# Patient Record
Sex: Female | Born: 1940
Health system: Southern US, Community
[De-identification: ages and names within clinical notes are randomized; demographics above are authoritative.]

## PROBLEM LIST (undated history)

## (undated) DIAGNOSIS — E119 Type 2 diabetes mellitus without complications: Secondary | ICD-10-CM

## (undated) DIAGNOSIS — E785 Hyperlipidemia, unspecified: Secondary | ICD-10-CM

## (undated) DIAGNOSIS — H3530 Unspecified macular degeneration: Secondary | ICD-10-CM

## (undated) DIAGNOSIS — N200 Calculus of kidney: Secondary | ICD-10-CM

## (undated) DIAGNOSIS — J45909 Unspecified asthma, uncomplicated: Secondary | ICD-10-CM

## (undated) DIAGNOSIS — J309 Allergic rhinitis, unspecified: Secondary | ICD-10-CM

## (undated) DIAGNOSIS — M1990 Unspecified osteoarthritis, unspecified site: Secondary | ICD-10-CM

## (undated) DIAGNOSIS — D649 Anemia, unspecified: Secondary | ICD-10-CM

## (undated) DIAGNOSIS — K5790 Diverticulosis of intestine, part unspecified, without perforation or abscess without bleeding: Secondary | ICD-10-CM

## (undated) HISTORY — DX: Unspecified asthma, uncomplicated: J45.909

## (undated) HISTORY — DX: Calculus of kidney: N20.0

## (undated) HISTORY — DX: Anemia, unspecified: D64.9

## (undated) HISTORY — DX: Type 2 diabetes mellitus without complications: E11.9

## (undated) HISTORY — DX: Allergic rhinitis, unspecified: J30.9

## (undated) HISTORY — PX: DILATION AND CURETTAGE OF UTERUS: SHX78

## (undated) HISTORY — DX: Hyperlipidemia, unspecified: E78.5

## (undated) HISTORY — DX: Diverticulosis of intestine, part unspecified, without perforation or abscess without bleeding: K57.90

## (undated) HISTORY — DX: Unspecified osteoarthritis, unspecified site: M19.90

## (undated) HISTORY — DX: Unspecified macular degeneration: H35.30

## (undated) HISTORY — PX: TONSILLECTOMY AND ADENOIDECTOMY: SUR1326

---

## 1997-11-14 ENCOUNTER — Other Ambulatory Visit: Admission: RE | Admit: 1997-11-14 | Discharge: 1997-11-14 | Payer: Self-pay | Admitting: *Deleted

## 1998-06-07 ENCOUNTER — Other Ambulatory Visit: Admission: RE | Admit: 1998-06-07 | Discharge: 1998-06-07 | Payer: Self-pay | Admitting: *Deleted

## 1999-09-19 ENCOUNTER — Encounter: Payer: Self-pay | Admitting: Internal Medicine

## 1999-09-19 ENCOUNTER — Encounter: Admission: RE | Admit: 1999-09-19 | Discharge: 1999-09-19 | Payer: Self-pay | Admitting: Internal Medicine

## 1999-10-30 ENCOUNTER — Ambulatory Visit (HOSPITAL_COMMUNITY): Admission: RE | Admit: 1999-10-30 | Discharge: 1999-10-30 | Payer: Self-pay | Admitting: Gastroenterology

## 1999-10-30 ENCOUNTER — Encounter: Payer: Self-pay | Admitting: Gastroenterology

## 2000-04-22 ENCOUNTER — Other Ambulatory Visit: Admission: RE | Admit: 2000-04-22 | Discharge: 2000-04-22 | Payer: Self-pay | Admitting: *Deleted

## 2003-06-28 ENCOUNTER — Other Ambulatory Visit: Admission: RE | Admit: 2003-06-28 | Discharge: 2003-06-28 | Payer: Self-pay | Admitting: Internal Medicine

## 2004-07-11 ENCOUNTER — Other Ambulatory Visit: Admission: RE | Admit: 2004-07-11 | Discharge: 2004-07-11 | Payer: Self-pay | Admitting: Family Medicine

## 2005-08-19 ENCOUNTER — Other Ambulatory Visit: Admission: RE | Admit: 2005-08-19 | Discharge: 2005-08-19 | Payer: Self-pay | Admitting: Internal Medicine

## 2007-09-08 ENCOUNTER — Other Ambulatory Visit: Admission: RE | Admit: 2007-09-08 | Discharge: 2007-09-08 | Payer: Self-pay | Admitting: Internal Medicine

## 2008-12-27 ENCOUNTER — Encounter: Admission: RE | Admit: 2008-12-27 | Discharge: 2008-12-27 | Payer: Self-pay | Admitting: Internal Medicine

## 2008-12-30 ENCOUNTER — Encounter: Admission: RE | Admit: 2008-12-30 | Discharge: 2008-12-30 | Payer: Self-pay | Admitting: Internal Medicine

## 2008-12-30 ENCOUNTER — Encounter (INDEPENDENT_AMBULATORY_CARE_PROVIDER_SITE_OTHER): Payer: Self-pay | Admitting: Diagnostic Radiology

## 2009-01-24 ENCOUNTER — Inpatient Hospital Stay (HOSPITAL_COMMUNITY): Admission: EM | Admit: 2009-01-24 | Discharge: 2009-01-24 | Payer: Self-pay | Admitting: Emergency Medicine

## 2009-05-27 ENCOUNTER — Emergency Department (HOSPITAL_COMMUNITY): Admission: EM | Admit: 2009-05-27 | Discharge: 2009-05-27 | Payer: Self-pay | Admitting: Emergency Medicine

## 2009-06-24 LAB — HM PAP SMEAR

## 2009-06-28 ENCOUNTER — Other Ambulatory Visit: Admission: RE | Admit: 2009-06-28 | Discharge: 2009-06-28 | Payer: Self-pay | Admitting: Obstetrics and Gynecology

## 2009-08-09 ENCOUNTER — Encounter: Admission: RE | Admit: 2009-08-09 | Discharge: 2009-08-09 | Payer: Self-pay | Admitting: Internal Medicine

## 2009-09-14 ENCOUNTER — Encounter: Admission: RE | Admit: 2009-09-14 | Discharge: 2009-09-14 | Payer: Self-pay | Admitting: Internal Medicine

## 2010-01-09 ENCOUNTER — Encounter: Admission: RE | Admit: 2010-01-09 | Discharge: 2010-01-09 | Payer: Self-pay | Admitting: Internal Medicine

## 2010-07-03 ENCOUNTER — Encounter: Payer: Self-pay | Admitting: Internal Medicine

## 2010-07-26 NOTE — Letter (Signed)
Summary: Colonoscopy Letter  [facility]  [address] [place], [place] [address]   Phone: [phone]  Fax: [phone]      July 03, 2010 MRN: [id number]   [facility] [address] [place], [place]  [address]   Dear Ms. Muff,   According to your medical record, it is time for you to schedule a Colonoscopy. The American Cancer Society recommends this procedure as a method to detect early colon cancer. Patients with a family history of colon cancer, or a personal history of colon polyps or inflammatory bowel disease are at increased risk.  This letter has been generated based on the recommendations made at the time of your procedure. If you feel that in your particular situation this may no longer apply, please contact our office.  Please call our office at [phone] to schedule this appointment or to update your records at your earliest convenience.  Thank you for cooperating with [place] to provide you with the very best care possible.   Sincerely,  Hedwig Morton. Juanda Chance, M.D.  [facility] [phone]

## 2010-09-21 LAB — HEMOGLOBIN A1C: Hgb A1c MFr Bld: 5.8 % (ref 4.0–6.0)

## 2010-09-25 LAB — CBC
HCT: 41.9 % (ref 36.0–46.0)
Hemoglobin: 14 g/dL (ref 12.0–15.0)
MCHC: 33.5 g/dL (ref 30.0–36.0)
MCV: 91.5 fL (ref 78.0–100.0)
Platelets: 308 10*3/uL (ref 150–400)
RBC: 4.57 MIL/uL (ref 3.87–5.11)
RDW: 13.4 % (ref 11.5–15.5)
WBC: 9 10*3/uL (ref 4.0–10.5)

## 2010-09-25 LAB — URINE CULTURE: Colony Count: 15000

## 2010-09-25 LAB — POCT I-STAT, CHEM 8
BUN: 14 mg/dL (ref 6–23)
Calcium, Ion: 1.1 mmol/L — ABNORMAL LOW (ref 1.12–1.32)
Chloride: 108 mEq/L (ref 96–112)
Creatinine, Ser: 0.7 mg/dL (ref 0.4–1.2)
Glucose, Bld: 119 mg/dL — ABNORMAL HIGH (ref 70–99)
HCT: 44 % (ref 36.0–46.0)
Hemoglobin: 15 g/dL (ref 12.0–15.0)
Potassium: 4 mEq/L (ref 3.5–5.1)
Sodium: 139 mEq/L (ref 135–145)
TCO2: 20 mmol/L (ref 0–100)

## 2010-09-25 LAB — DIFFERENTIAL
Basophils Absolute: 0 10*3/uL (ref 0.0–0.1)
Basophils Relative: 1 % (ref 0–1)
Eosinophils Absolute: 0.3 10*3/uL (ref 0.0–0.7)
Eosinophils Relative: 3 % (ref 0–5)
Lymphocytes Relative: 29 % (ref 12–46)
Lymphs Abs: 2.6 10*3/uL (ref 0.7–4.0)
Monocytes Absolute: 0.5 10*3/uL (ref 0.1–1.0)
Monocytes Relative: 5 % (ref 3–12)
Neutro Abs: 5.6 10*3/uL (ref 1.7–7.7)
Neutrophils Relative %: 62 % (ref 43–77)

## 2010-09-25 LAB — URINALYSIS, ROUTINE W REFLEX MICROSCOPIC
Bilirubin Urine: NEGATIVE
Glucose, UA: NEGATIVE mg/dL
Hgb urine dipstick: NEGATIVE
Ketones, ur: NEGATIVE mg/dL
Nitrite: NEGATIVE
Protein, ur: NEGATIVE mg/dL
Specific Gravity, Urine: 1.013 (ref 1.005–1.030)
Urobilinogen, UA: 0.2 mg/dL (ref 0.0–1.0)
pH: 5.5 (ref 5.0–8.0)

## 2010-09-25 LAB — POCT CARDIAC MARKERS
CKMB, poc: <1 ng/mL — ABNORMAL LOW (ref 1.0–8.0)
Myoglobin, poc: 79.6 ng/mL (ref 12–200)
Troponin i, poc: <0.05 ng/mL (ref 0.00–0.09)

## 2010-09-25 LAB — URINE MICROSCOPIC-ADD ON

## 2010-09-30 LAB — CK TOTAL AND CKMB (NOT AT ARMC)
CK, MB: 1.4 ng/mL (ref 0.3–4.0)
CK, MB: 1.6 ng/mL (ref 0.3–4.0)
Relative Index: INVALID (ref 0.0–2.5)
Relative Index: INVALID (ref 0.0–2.5)
Total CK: 91 U/L (ref 7–177)
Total CK: 92 U/L (ref 7–177)

## 2010-09-30 LAB — URINALYSIS, ROUTINE W REFLEX MICROSCOPIC
Bilirubin Urine: NEGATIVE
Glucose, UA: NEGATIVE mg/dL
Hgb urine dipstick: NEGATIVE
Ketones, ur: NEGATIVE mg/dL
Nitrite: NEGATIVE
Protein, ur: NEGATIVE mg/dL
Specific Gravity, Urine: 1.01 (ref 1.005–1.030)
Urobilinogen, UA: 0.2 mg/dL (ref 0.0–1.0)
pH: 6 (ref 5.0–8.0)

## 2010-09-30 LAB — CBC
HCT: 38.7 % (ref 36.0–46.0)
HCT: 42.3 % (ref 36.0–46.0)
Hemoglobin: 13.2 g/dL (ref 12.0–15.0)
Hemoglobin: 14.6 g/dL (ref 12.0–15.0)
MCHC: 34.1 g/dL (ref 30.0–36.0)
MCHC: 34.5 g/dL (ref 30.0–36.0)
MCV: 91.2 fL (ref 78.0–100.0)
MCV: 91.7 fL (ref 78.0–100.0)
Platelets: 285 10*3/uL (ref 150–400)
Platelets: 322 10*3/uL (ref 150–400)
RBC: 4.24 MIL/uL (ref 3.87–5.11)
RBC: 4.61 MIL/uL (ref 3.87–5.11)
RDW: 12.8 % (ref 11.5–15.5)
RDW: 13.1 % (ref 11.5–15.5)
WBC: 7.2 10*3/uL (ref 4.0–10.5)
WBC: 9.1 10*3/uL (ref 4.0–10.5)

## 2010-09-30 LAB — PROTIME-INR
INR: 1 (ref 0.00–1.49)
Prothrombin Time: 13.1 seconds (ref 11.6–15.2)

## 2010-09-30 LAB — COMPREHENSIVE METABOLIC PANEL
ALT: 21 U/L (ref 0–35)
AST: 19 U/L (ref 0–37)
Albumin: 3.3 g/dL — ABNORMAL LOW (ref 3.5–5.2)
Alkaline Phosphatase: 47 U/L (ref 39–117)
BUN: 15 mg/dL (ref 6–23)
CO2: 25 mEq/L (ref 19–32)
Calcium: 8.6 mg/dL (ref 8.4–10.5)
Chloride: 108 mEq/L (ref 96–112)
Creatinine, Ser: 0.8 mg/dL (ref 0.4–1.2)
GFR calc Af Amer: >60 mL/min (ref >60)
GFR calc non Af Amer: >60 mL/min (ref >60)
Glucose, Bld: 105 mg/dL — ABNORMAL HIGH (ref 70–99)
Potassium: 3.9 mEq/L (ref 3.5–5.1)
Sodium: 139 mEq/L (ref 135–145)
Total Bilirubin: 0.6 mg/dL (ref 0.3–1.2)
Total Protein: 6.1 g/dL (ref 6.0–8.3)

## 2010-09-30 LAB — LIPID PANEL
Cholesterol: 167 mg/dL (ref 0–200)
HDL: 32 mg/dL — ABNORMAL LOW (ref >39)
LDL Cholesterol: 119 mg/dL — ABNORMAL HIGH (ref 0–99)
Total CHOL/HDL Ratio: 5.2 RATIO
Triglycerides: 81 mg/dL (ref <150)
VLDL: 16 mg/dL (ref 0–40)

## 2010-09-30 LAB — TSH: TSH: 3.278 u[IU]/mL (ref 0.350–4.500)

## 2010-09-30 LAB — POCT I-STAT, CHEM 8
BUN: 22 mg/dL (ref 6–23)
Calcium, Ion: 1.19 mmol/L (ref 1.12–1.32)
Chloride: 106 mEq/L (ref 96–112)
Creatinine, Ser: 0.9 mg/dL (ref 0.4–1.2)
Glucose, Bld: 98 mg/dL (ref 70–99)
HCT: 45 % (ref 36.0–46.0)
Hemoglobin: 15.3 g/dL — ABNORMAL HIGH (ref 12.0–15.0)
Potassium: 4.1 mEq/L (ref 3.5–5.1)
Sodium: 140 mEq/L (ref 135–145)
TCO2: 26 mmol/L (ref 0–100)

## 2010-09-30 LAB — DIFFERENTIAL
Basophils Absolute: 0.1 10*3/uL (ref 0.0–0.1)
Basophils Relative: 1 % (ref 0–1)
Eosinophils Absolute: 0.3 10*3/uL (ref 0.0–0.7)
Eosinophils Relative: 3 % (ref 0–5)
Lymphocytes Relative: 36 % (ref 12–46)
Lymphs Abs: 3.2 10*3/uL (ref 0.7–4.0)
Monocytes Absolute: 0.6 10*3/uL (ref 0.1–1.0)
Monocytes Relative: 7 % (ref 3–12)
Neutro Abs: 4.9 10*3/uL (ref 1.7–7.7)
Neutrophils Relative %: 54 % (ref 43–77)

## 2010-09-30 LAB — POCT CARDIAC MARKERS
CKMB, poc: <1 ng/mL — ABNORMAL LOW (ref 1.0–8.0)
CKMB, poc: <1 ng/mL — ABNORMAL LOW (ref 1.0–8.0)
Myoglobin, poc: 56.1 ng/mL (ref 12–200)
Myoglobin, poc: 57.4 ng/mL (ref 12–200)
Troponin i, poc: <0.05 ng/mL (ref 0.00–0.09)
Troponin i, poc: <0.05 ng/mL (ref 0.00–0.09)

## 2010-09-30 LAB — D-DIMER, QUANTITATIVE: D-Dimer, Quant: <0.22 ug/mL-FEU (ref 0.00–0.48)

## 2010-09-30 LAB — TROPONIN I
Troponin I: 0.01 ng/mL (ref 0.00–0.06)
Troponin I: 0.01 ng/mL (ref 0.00–0.06)

## 2010-09-30 LAB — LIPASE, BLOOD: Lipase: 20 U/L (ref 11–59)

## 2010-09-30 LAB — URINE MICROSCOPIC-ADD ON

## 2010-09-30 LAB — APTT: aPTT: 26 seconds (ref 24–37)

## 2010-11-06 NOTE — Discharge Summary (Signed)
[name], [name]               ACCOUNT NO.:  [id number]   MEDICAL RECORD NO.:  [id number]          PATIENT TYPE:  INP   LOCATION:  3705                         FACILITY:  [facility]   PHYSICIAN:  Georgann Housekeeper, MD      DATE OF BIRTH:  01-24-1941   DATE OF ADMISSION:  01/23/2009  DATE OF DISCHARGE:  01/24/2009                               DISCHARGE SUMMARY   DIAGNOSIS:  Chest pain, rule out myocardial stenosis.   MEDICATIONS AT DISCHARGE:  1. Aspirin 81 mg daily.  2. Multivitamin one daily.  3. Fish oil tablets one daily.   LABORATORY DATA:  Cardiac markers x3 negative.  CBC normal.  Lipid  profile, cholesterol 167 with a LDL of 119.  D-dimer negative less than  0.22.  Urine negative.  Lipase normal.   HOSPITAL COURSE:  Chest x-ray negative.  EKG, normal sinus rhythm.  Telemetry, normal sinus rhythm.   HOSPITAL COURSE:  A 70 year old female admitted with episode of chest  discomfort on the left upper side with nausea and some started at home.  The patient was chest pain free by the time she came to the emergency  room.  Her cardiac markers, EKG, chest x-ray, and labs all unremarkable.  The patient will get an outpatient stress test with Dr. Roger Shelter,  Cardiology today at 11 o'clock and I discussed with the cardiologist and  the patient.  The patient was discharged home and then arrange  outpatient cardiac stress test.  Continue on aspirin and multivitamin.  For now, differential includes acid reflux disease versus  musculoskeletal or cardiac.      Georgann Housekeeper, MD  Electronically Signed     KH/MEDQ  D:  01/24/2009  T:  01/24/2009  Job:  [id number]

## 2010-11-06 NOTE — H&P (Signed)
Erica [name], Erica [name]               ACCOUNT NO.:  [id number]   MEDICAL RECORD NO.:  [id number]          PATIENT TYPE:  INP   LOCATION:  3705                         FACILITY:  [facility]   PHYSICIAN:  Michiel Cowboy, MDDATE OF BIRTH:  1940-11-12   DATE OF ADMISSION:  01/23/2009  DATE OF DISCHARGE:                              HISTORY & PHYSICAL   PRIMARY CARE Jazalyn Mondor:  Georgann Housekeeper, MD   CHIEF COMPLAINT:  Chest pain.   The patient is a 70 year old female, with no significant past medical  history, who presents with chest pain.  Patient is a very active 68-year-  old female.  She was doing her regular laps in the pool today.  After  she was done with swimming she came out and started to prepare lunch.  About an hour later, after she was done with exercise, she developed  chest pain.  At first it was more in the left almost shoulder or high  chest and somewhat sharp, radiating a bit to the back and down her left  arm.  After this persisted for some time, she started to develop overall  generalized chest pressure and also a chest discomfort going up into her  neck.  She was short of breath and nauseous, although not diaphoretic.  This is a lasted for about 20 minutes and then resolved spontaneously.  Before it resolved completely, she did feel slightly lightheaded, at  which point her husband called the ambulance and she was brought into  the emergency department. In the ER, she was chest pain-free, negative  cardiac markers and normal EKG at which point [facility] was  called for admission.   Of note, the patient does endorse that for the past few weeks she  started to notice that her left leg is somewhat more swollen although I  cannot appreciate any edema.  She says actually currently it is a little  better but sometimes she notices a very significant swelling in her left  leg compared to her right.  She has not had any recent travel and in no  significant DVT risk  factors.   SOCIAL HISTORY:  The patient never smoked, does not drink, does not  abuse drugs.  Lives at home with her husband who is very supportive.   FAMILY HISTORY:  Significant for father with coronary artery disease in  his 7s but he was a heavy smoker.   ALLERGIES:  No known drug allergies.   MEDICATIONS:  1. Baby aspirin 81 mg p.o. daily.  2. Fish oil 1000 mg daily.  3. Multivitamin.   PHYSICAL EXAMINATION:  VITALS:  Temperature 97.8, blood pressure 119/73,  pulse 73, respirations 18, saturating 98% in room air.  The patient appears to be currently in no acute distress.  HEAD:  Nontraumatic.  Moist mucous membranes.  LUNGS:  Clear to auscultation bilaterally.  HEART: Regular rate and rhythm.  No murmurs, rubs or gallop appreciated.  ABDOMEN: Soft, nontender, nondistended.  EXTREMITIES: Without clubbing, cyanosis or appreciable edema.  Strength  5/5 in all extremities.  Cranial nerves II through XII are intact.  SKIN:  Clean, dry and intact.   LABS:  Hemoglobin 15.3, white blood cell count 9.1.  Sodium 40,  potassium 4.1 creatinine 0.9.  Cardiac enzymes negative.  UA  unremarkable.  Chest x-ray unremarkable.  EKG showing normal sinus  rhythm, heart rate 65, no evidence of ischemia.   ASSESSMENT AND PLAN:  This is a 70 year old female with chest pain and  risk factors including age.  1. Chest pain.  Will admit and cycle cardiac markers.  The fact that,      so far, cardiac markers have been unremarkable is encouraging.  I      would recommend cardiology consult and stress test if she rules out      in a.m.  Will check fasting lipid panel and hemoglobin A1c.  2. Given history of intermittent left leg swelling, would check a D-      dimer and Dopplers.  3. Prophylaxis:  Protonix and Lovenox.      Michiel Cowboy, MD  Electronically Signed     AVD/MEDQ  D:  01/24/2009  T:  01/24/2009  Job:  [id number]   cc:   Georgann Housekeeper, MD

## 2011-01-25 ENCOUNTER — Other Ambulatory Visit: Payer: Self-pay | Admitting: Diagnostic Neuroimaging

## 2011-01-25 DIAGNOSIS — I739 Peripheral vascular disease, unspecified: Secondary | ICD-10-CM

## 2011-01-28 ENCOUNTER — Encounter (INDEPENDENT_AMBULATORY_CARE_PROVIDER_SITE_OTHER): Payer: Medicare Other | Admitting: *Deleted

## 2011-01-28 DIAGNOSIS — I739 Peripheral vascular disease, unspecified: Secondary | ICD-10-CM

## 2011-01-31 ENCOUNTER — Encounter: Payer: Self-pay | Admitting: Diagnostic Neuroimaging

## 2011-06-25 LAB — HM MAMMOGRAPHY

## 2011-09-25 ENCOUNTER — Telehealth: Payer: Self-pay | Admitting: Internal Medicine

## 2011-09-25 ENCOUNTER — Other Ambulatory Visit: Payer: Self-pay | Admitting: Internal Medicine

## 2011-09-25 DIAGNOSIS — Z1231 Encounter for screening mammogram for malignant neoplasm of breast: Secondary | ICD-10-CM

## 2011-09-25 LAB — HEMOGLOBIN A1C: Hgb A1c MFr Bld: 6.1 % — AB (ref 4.0–6.0)

## 2011-09-25 LAB — BASIC METABOLIC PANEL
BUN: 17 mg/dL (ref 4–21)
Creatinine: 0.8 mg/dL (ref 0.5–1.1)
Glucose: 90 mg/dL
Potassium: 4.1 mmol/L (ref 3.4–5.3)
Sodium: 137 mmol/L (ref 137–147)

## 2011-09-25 LAB — HEPATIC FUNCTION PANEL
ALT: 15 U/L (ref 7–35)
AST: 13 U/L (ref 13–35)
Alkaline Phosphatase: 62 U/L (ref 25–125)
Bilirubin, Total: 0.2 mg/dL

## 2011-09-25 LAB — LIPID PANEL
Cholesterol: 179 mg/dL (ref 0–200)
HDL: 37 mg/dL (ref 35–70)
LDL Cholesterol: 118 mg/dL
Triglycerides: 177 mg/dL — AB (ref 40–160)

## 2011-09-25 LAB — CBC AND DIFFERENTIAL
HCT: 40 % (ref 36–46)
Hemoglobin: 13.8 g/dL (ref 12.0–16.0)
Platelets: 283 10*3/uL (ref 150–399)
WBC: 7.3 10^3/mL

## 2011-09-25 LAB — TSH: TSH: 1.75 u[IU]/mL (ref 0.41–5.90)

## 2011-09-25 NOTE — Telephone Encounter (Signed)
Left a message for patient to call me. 

## 2011-09-25 NOTE — Telephone Encounter (Signed)
Left message for patient again.

## 2011-09-26 ENCOUNTER — Ambulatory Visit
Admission: RE | Admit: 2011-09-26 | Discharge: 2011-09-26 | Disposition: A | Discharge status: Home / Self Care | Payer: Medicare Other | Source: Ambulatory Visit | Attending: Internal Medicine | Admitting: Internal Medicine

## 2011-09-26 DIAGNOSIS — Z1231 Encounter for screening mammogram for malignant neoplasm of breast: Secondary | ICD-10-CM

## 2011-09-30 NOTE — Telephone Encounter (Signed)
Spoke with patient and scheduled her on 10/15/11 at 9:30 AM with Dr. Juanda Chance.

## 2011-10-04 ENCOUNTER — Encounter: Payer: Self-pay | Admitting: *Deleted

## 2011-10-08 ENCOUNTER — Encounter: Payer: Self-pay | Admitting: Internal Medicine

## 2011-10-15 ENCOUNTER — Encounter: Payer: Self-pay | Admitting: Internal Medicine

## 2011-10-15 ENCOUNTER — Other Ambulatory Visit (INDEPENDENT_AMBULATORY_CARE_PROVIDER_SITE_OTHER): Payer: Medicare Other

## 2011-10-15 ENCOUNTER — Ambulatory Visit (INDEPENDENT_AMBULATORY_CARE_PROVIDER_SITE_OTHER): Payer: Medicare Other | Admitting: Internal Medicine

## 2011-10-15 VITALS — BP 120/70 | HR 68 | Ht 64.0 in | Wt 150.4 lb

## 2011-10-15 DIAGNOSIS — K3189 Other diseases of stomach and duodenum: Secondary | ICD-10-CM

## 2011-10-15 DIAGNOSIS — D849 Immunodeficiency, unspecified: Secondary | ICD-10-CM

## 2011-10-15 DIAGNOSIS — Z79899 Other long term (current) drug therapy: Secondary | ICD-10-CM

## 2011-10-15 DIAGNOSIS — E538 Deficiency of other specified B group vitamins: Secondary | ICD-10-CM

## 2011-10-15 DIAGNOSIS — K5010 Crohn's disease of large intestine without complications: Secondary | ICD-10-CM

## 2011-10-15 DIAGNOSIS — R197 Diarrhea, unspecified: Secondary | ICD-10-CM

## 2011-10-15 DIAGNOSIS — R1013 Epigastric pain: Secondary | ICD-10-CM

## 2011-10-15 DIAGNOSIS — R198 Other specified symptoms and signs involving the digestive system and abdomen: Secondary | ICD-10-CM

## 2011-10-15 LAB — SEDIMENTATION RATE: Sed Rate: 8 mm/hr (ref 0–22)

## 2011-10-15 MED ORDER — ALIGN 4 MG PO CAPS: 1.0000 | ORAL_CAPSULE | Freq: Every day | ORAL | Status: DC

## 2011-10-15 MED ORDER — HYOSCYAMINE SULFATE ER 0.375 MG PO TB12: 375.0000 ug | ORAL_TABLET | ORAL | Status: DC

## 2011-10-15 MED ORDER — CYANOCOBALAMIN 1000 MCG/ML IJ SOLN
1000.0000 ug | Freq: Once | INTRAMUSCULAR | Status: AC
  Administered 2011-10-15: 1000 ug via INTRAMUSCULAR

## 2011-10-15 MED ORDER — PEG-KCL-NACL-NASULF-NA ASC-C 100 G PO SOLR: 1.0000 | Freq: Once | ORAL | Status: DC

## 2011-10-15 NOTE — Progress Notes (Signed)
EGYPT WELCOME [birth year] MRN [id number]  History of Present Illness:  This is a 71 year old white female retired professor of anthropology  from [employer]. She has been having dyspepsia, bloating and urgent bowel movements, up to 5 a day especially postprandially. She denies rectal bleeding. Her weight has increased by 20 pounds in the last 10 years. She denies any specific dietary intolerances. She tried a gluten free diet without success. A colonoscopy in 2005 as well as in 1997 showed mild diverticulosis of the left colon. There is a family history of colon cancer in her mother who had this disease at age 31. Blood tests from her primary care physician are all normal except for a low B12 of 265. She has been on oral B12 supplements of 1,000 mcg daily but her last B12 level was 26 on Apri 13,2013. She doesn not smoke or drink alcohol. She does not use anti-inflammatory agents. There is no family history of inflammatory bowel disease.   Past Medical History  Diagnosis Date  . Diverticulosis   . Breast nodule    Past Surgical History  Procedure Date  . Tonsillectomy and adenoidectomy   . Dilation and curettage of uterus     reports that she has never smoked. She has never used smokeless tobacco. She reports that she drinks alcohol. She reports that she does not use illicit drugs. family history includes Colon cancer in her mother; Diabetes in her brother and paternal aunt; and Heart disease in her father. No Known Allergies      Review of Systems: Denies heartburn, dysphagia chest pain or shortness of breath  The remainder of the 10 point ROS is negative except as outlined in H&P   Physical Exam: General appearance  Well developed, in no distress. Eyes- non icteric. HEENT nontraumatic, normocephalic. Mouth no lesions, tongue papillated, no cheilosis. Neck supple without adenopathy, thyroid not enlarged, no carotid bruits, no JVD. Lungs Clear to auscultation bilaterally. Cor normal  S1, normal S2, regular rhythm, no murmur,  quiet precordium. Abdomen: Soft, nontender abdomen with minimal discomfort in the left lower quadrant. No palpable mass. No distention. Normal active bowel sounds. Liver edge at costal margin. Rectal: Soft Hemoccult negative stool Extremities no pedal edema. Skin no lesions. Neurological alert and oriented x 3. Psychological normal mood and affect.  Assessment and Plan:  Problem #1 Nonspecific symptoms of bloating and postprandial urgency suggestive of irritable bowel syndrome. She would like to have this further evaluated. We will obtain a celiac panel and sedimentation rate. Her TSH was normal at 1.75. We will start Levbid 0.375 mg daily and give her samples of a probiotic to take one a day. I would like to obtain an upper abdominal ultrasound to look at the gallbladder and pancreas.  Problem #2 Family history of colon cancer in her mother. Her last colonoscopy was in 2005. She is a due for a recall colonoscopy which will be scheduled in the future. We will also plan to take biopsies of the colon to rule out microscopic colitis.  Patient has voiced the desire to switch her PCP to Dr Abner Greenspan or one of her associates as their facility is closer to her home. Patient will call to arrange an office visit.  10/15/2011 Lina Sar  OF NOTE, THE FOLLOWING DIAGNOSIS CODES WERE ENTERED IN ERROR FOR THIS VISIT: 555.1, 279.3, V58.69. PLEASE DISREGARD THESE CODES AS WE ARE UNABLE TO REMOVE THEM FROM THIS VISIT. CORRECT CODES ARE DYSPEPSIA, DIARRHEA AND OTHER SYMPTOMS INVOLVING THE ABDOMEN AND  PELVIS.

## 2011-10-15 NOTE — Patient Instructions (Addendum)
You have been scheduled for a colonoscopy with propofol. Please follow written instructions given to you at your visit today.  Please pick up your prep kit at the pharmacy within the next 1-3 days. You have been scheduled for an abdominal ultrasound at [facility] (1st floor of hospital) on Friday 10/18/11 at 8:30. Please arrive 15 minutes prior to your appointment for registration. Make certain not to have anything to eat or drink 6 hours prior to your appointment. Should you need to reschedule your appointment, please contact radiology at [phone]. We have given you samples of Align. This puts good bacteria back into your colon. You should take 1 capsule by mouth once daily. If this works well for you, it can be purchased over the counter. We have given you a b12 injection today. Your physician has requested that you go to the basement for the following lab work before leaving today: Sedimentation Rate, Celiac Panel

## 2011-10-16 LAB — CELIAC PANEL 10
Endomysial Screen: NEGATIVE
Gliadin IgA: 6.1 U/mL (ref <20)
Gliadin IgG: 4.5 U/mL (ref <20)
IgA: 214 mg/dL (ref 69–380)
Tissue Transglut Ab: 4 U/mL (ref <20)
Tissue Transglutaminase Ab, IgA: 3.4 U/mL (ref <20)

## 2011-10-18 ENCOUNTER — Other Ambulatory Visit (HOSPITAL_COMMUNITY): Payer: Medicare Other

## 2011-10-18 ENCOUNTER — Ambulatory Visit (HOSPITAL_COMMUNITY)
Admission: RE | Admit: 2011-10-18 | Discharge: 2011-10-18 | Disposition: A | Discharge status: Home / Self Care | Payer: Medicare Other | Source: Ambulatory Visit | Attending: Internal Medicine | Admitting: Internal Medicine

## 2011-10-18 DIAGNOSIS — R198 Other specified symptoms and signs involving the digestive system and abdomen: Secondary | ICD-10-CM

## 2011-10-18 DIAGNOSIS — R1013 Epigastric pain: Secondary | ICD-10-CM

## 2011-10-21 ENCOUNTER — Ambulatory Visit (HOSPITAL_COMMUNITY)
Admission: RE | Admit: 2011-10-21 | Discharge: 2011-10-21 | Disposition: A | Discharge status: Home / Self Care | Payer: Medicare Other | Source: Ambulatory Visit | Attending: Internal Medicine | Admitting: Internal Medicine

## 2011-10-21 DIAGNOSIS — K3189 Other diseases of stomach and duodenum: Secondary | ICD-10-CM | POA: Insufficient documentation

## 2011-10-21 DIAGNOSIS — R198 Other specified symptoms and signs involving the digestive system and abdomen: Secondary | ICD-10-CM | POA: Insufficient documentation

## 2011-10-21 DIAGNOSIS — R1013 Epigastric pain: Secondary | ICD-10-CM | POA: Insufficient documentation

## 2011-11-27 LAB — HM DEXA SCAN

## 2011-12-04 ENCOUNTER — Encounter: Payer: Medicare Other | Admitting: Internal Medicine

## 2012-01-08 ENCOUNTER — Ambulatory Visit (AMBULATORY_SURGERY_CENTER): Payer: Medicare Other | Admitting: *Deleted

## 2012-01-08 VITALS — Ht 64.0 in | Wt 141.4 lb

## 2012-01-08 DIAGNOSIS — R197 Diarrhea, unspecified: Secondary | ICD-10-CM

## 2012-01-14 ENCOUNTER — Ambulatory Visit (AMBULATORY_SURGERY_CENTER): Payer: Medicare Other | Admitting: Internal Medicine

## 2012-01-14 ENCOUNTER — Encounter: Payer: Self-pay | Admitting: Internal Medicine

## 2012-01-14 VITALS — BP 117/66 | HR 76 | Temp 96.9°F | Resp 20 | Ht 64.0 in | Wt 141.0 lb

## 2012-01-14 DIAGNOSIS — D126 Benign neoplasm of colon, unspecified: Secondary | ICD-10-CM

## 2012-01-14 DIAGNOSIS — R197 Diarrhea, unspecified: Secondary | ICD-10-CM

## 2012-01-14 DIAGNOSIS — Z800 Family history of malignant neoplasm of digestive organs: Secondary | ICD-10-CM

## 2012-01-14 MED ORDER — SODIUM CHLORIDE 0.9 % IV SOLN: 500.0000 mL | INTRAVENOUS | Status: DC

## 2012-01-14 NOTE — Patient Instructions (Addendum)
YOU HAD AN ENDOSCOPIC PROCEDURE TODAY AT THE [facility]: Refer to the procedure report that was given to you for any specific questions about what was found during the examination.  If the procedure report does not answer your questions, please call your gastroenterologist to clarify.  If you requested that your care partner not be given the details of your procedure findings, then the procedure report has been included in a sealed envelope for you to review at your convenience later.  YOU SHOULD EXPECT: Some feelings of bloating in the abdomen. Passage of more gas than usual.  Walking can help get rid of the air that was put into your GI tract during the procedure and reduce the bloating. If you had a lower endoscopy (such as a colonoscopy or flexible sigmoidoscopy) you may notice spotting of blood in your stool or on the toilet paper. If you underwent a bowel prep for your procedure, then you may not have a normal bowel movement for a few days.  DIET: Your first meal following the procedure should be a light meal and then it is ok to progress to your normal diet.  A half-sandwich or bowl of soup is an example of a good first meal.  Heavy or fried foods are harder to digest and may make you feel nauseous or bloated.  Likewise meals heavy in dairy and vegetables can cause extra gas to form and this can also increase the bloating.  Drink plenty of fluids but you should avoid alcoholic beverages for 24 hours.  ACTIVITY: Your care partner should take you home directly after the procedure.  You should plan to take it easy, moving slowly for the rest of the day.  You can resume normal activity the day after the procedure however you should NOT DRIVE or use heavy machinery for 24 hours (because of the sedation medicines used during the test).    SYMPTOMS TO REPORT IMMEDIATELY: A gastroenterologist can be reached at any hour.  During normal business hours, 8:30 AM to 5:00 PM Monday through Friday,  call [phone].  After hours and on weekends, please call the GI answering service at [phone] who will take a message and have the physician on call contact you.   Following lower endoscopy (colonoscopy or flexible sigmoidoscopy):  Excessive amounts of blood in the stool  Significant tenderness or worsening of abdominal pains  Swelling of the abdomen that is new, acute  Fever of 100F or higher    FOLLOW UP: If any biopsies were taken you will be contacted by phone or by letter within the next 1-3 weeks.  Call your gastroenterologist if you have not heard about the biopsies in 3 weeks.  Our staff will call the home number listed on your records the next business day following your procedure to check on you and address any questions or concerns that you may have at that time regarding the information given to you following your procedure. This is a courtesy call and so if there is no answer at the home number and we have not heard from you through the emergency physician on call, we will assume that you have returned to your regular daily activities without incident.  SIGNATURES/CONFIDENTIALITY: You and/or your care partner have signed paperwork which will be entered into your electronic medical record.  These signatures attest to the fact that that the information above on your After Visit Summary has been reviewed and is understood.  Full responsibility of the confidentiality   of this discharge information lies with you and/or your care-partner.     

## 2012-01-14 NOTE — Progress Notes (Signed)
Patient did not have preoperative order for IV antibiotic SSI prophylaxis. [phone])  Patient did not experience any of the following events: a burn prior to discharge; a fall within the facility; wrong site/side/patient/procedure/implant event; or a hospital transfer or hospital admission upon discharge from the facility. [phone])    Patient got out of bed and went to the bathroom to pass gas.    She stated that she couldn't get up, but she did without any problems and walked fast to the bathroom.  Her husband is still with her at this time.  It is 1510, and she is still in the bathroom passing gas.   She will get dressed in a few moments according to her husband.

## 2012-01-14 NOTE — Op Note (Signed)
[facility] [address] [place], [place]  [address]  COLONOSCOPY PROCEDURE REPORT  PATIENT:  Erica [name], Erica [name]  MR#:  [id number] BIRTHDATE:  December 14, 1940, 71 yrs. old  GENDER:  female ENDOSCOPIST:  Hedwig Morton. Juanda Chance, MD REF. BY:  Georgann Housekeeper, M.D. PROCEDURE DATE:  01/14/2012 PROCEDURE:  Colonoscopy with biopsy ASA CLASS:  Class II INDICATIONS:  unexplained diarrhea, family history of colon cancer mother with colon cancer prior colon 1997 and 2005, improved on Align and Levbid MEDICATIONS:   MAC sedation, administered by CRNA, propofol (Diprivan) 150 mg  DESCRIPTION OF PROCEDURE:   After the risks and benefits and of the procedure were explained, informed consent was obtained. Digital rectal exam was performed and revealed no rectal masses. The LB CF-[id number] endoscope was introduced through the anus and advanced to the cecum, which was identified by both the appendix and ileocecal valve.  The quality of the prep was good, using MoviPrep.  The instrument was then slowly withdrawn as the colon was fully examined. <<PROCEDUREIMAGES>>  FINDINGS:  Two polyps were found. 3 mm polyp in the cecum and right colon The polyps were removed using cold biopsy forceps (see image3 and image4).  Mild diverticulosis was found (see image6, image5, and image1).  This was otherwise a normal examination of the colon (see image3 and image4).   Retroflexed views in the rectum revealed no abnormalities.    The scope was then withdrawn from the patient and the procedure completed.  COMPLICATIONS:  None ENDOSCOPIC IMPRESSION: 1) Two polyps 2) Mild diverticulosis 3) Otherwise normal examination s/p random biopsies to r/o microscopic colitis RECOMMENDATIONS: 1) Await pathology results 2) High fiber diet. continue probiotics and Levbid prn  REPEAT EXAM:  In 5 year(s) for.  ______________________________ Hedwig Morton. Juanda Chance, MD  CC:  n. eSIGNED:   Hedwig Morton. Candee Hoon at 01/14/2012 02:02  PM  Virgilio Frees, [phone]

## 2012-01-15 ENCOUNTER — Telehealth: Payer: Self-pay | Admitting: *Deleted

## 2012-01-15 NOTE — Telephone Encounter (Signed)
  Follow up Call-  Call back number 01/14/2012  Post procedure Call Back phone  # [phone]  Permission to leave phone message Yes   Pt was asleep, spoke with her husband  Patient questions:  Do you have a fever, pain , or abdominal swelling? no Pain Score  0 *  Have you tolerated food without any problems? yes  Have you been able to return to your normal activities? yes  Do you have any questions about your discharge instructions: Diet   no Medications  no Follow up visit  no  Do you have questions or concerns about your Care? no  Actions: * If pain score is 4 or above: No action needed, pain <4.

## 2012-01-20 ENCOUNTER — Encounter: Payer: Self-pay | Admitting: Internal Medicine

## 2012-01-27 ENCOUNTER — Telehealth: Payer: Self-pay | Admitting: Internal Medicine

## 2012-01-27 NOTE — Telephone Encounter (Signed)
Forward 56 pages from [facility] to Dr. Lina Sar for review on 01-27-12 ym

## 2012-01-28 NOTE — Telephone Encounter (Signed)
These records actually stated on the form that was sent along with them that they were for Dr Con Memos review, not Dr Juanda Chance. I have given the records back to our medical records department for transport to the appropriate physician.

## 2012-05-19 ENCOUNTER — Other Ambulatory Visit (INDEPENDENT_AMBULATORY_CARE_PROVIDER_SITE_OTHER): Payer: Medicare Other

## 2012-05-19 ENCOUNTER — Encounter: Payer: Self-pay | Admitting: Internal Medicine

## 2012-05-19 ENCOUNTER — Ambulatory Visit (INDEPENDENT_AMBULATORY_CARE_PROVIDER_SITE_OTHER): Payer: Medicare Other | Admitting: Internal Medicine

## 2012-05-19 VITALS — BP 118/64 | HR 79 | Temp 97.8°F | Resp 14 | Ht 64.0 in | Wt 136.5 lb

## 2012-05-19 DIAGNOSIS — D649 Anemia, unspecified: Secondary | ICD-10-CM

## 2012-05-19 DIAGNOSIS — E119 Type 2 diabetes mellitus without complications: Secondary | ICD-10-CM | POA: Insufficient documentation

## 2012-05-19 DIAGNOSIS — M1990 Unspecified osteoarthritis, unspecified site: Secondary | ICD-10-CM | POA: Insufficient documentation

## 2012-05-19 DIAGNOSIS — R5383 Other fatigue: Secondary | ICD-10-CM

## 2012-05-19 DIAGNOSIS — R7309 Other abnormal glucose: Secondary | ICD-10-CM

## 2012-05-19 DIAGNOSIS — M899 Disorder of bone, unspecified: Secondary | ICD-10-CM

## 2012-05-19 DIAGNOSIS — M949 Disorder of cartilage, unspecified: Secondary | ICD-10-CM

## 2012-05-19 DIAGNOSIS — E559 Vitamin D deficiency, unspecified: Secondary | ICD-10-CM

## 2012-05-19 DIAGNOSIS — R5381 Other malaise: Secondary | ICD-10-CM

## 2012-05-19 DIAGNOSIS — M8580 Other specified disorders of bone density and structure, unspecified site: Secondary | ICD-10-CM

## 2012-05-19 DIAGNOSIS — N200 Calculus of kidney: Secondary | ICD-10-CM | POA: Insufficient documentation

## 2012-05-19 DIAGNOSIS — Z1331 Encounter for screening for depression: Secondary | ICD-10-CM

## 2012-05-19 DIAGNOSIS — R739 Hyperglycemia, unspecified: Secondary | ICD-10-CM

## 2012-05-19 DIAGNOSIS — G629 Polyneuropathy, unspecified: Secondary | ICD-10-CM | POA: Insufficient documentation

## 2012-05-19 DIAGNOSIS — G2581 Restless legs syndrome: Secondary | ICD-10-CM

## 2012-05-19 LAB — IBC PANEL
Iron: 77 ug/dL (ref 42–145)
Saturation Ratios: 24.5 % (ref 20.0–50.0)
Transferrin: 224.4 mg/dL (ref 212.0–360.0)

## 2012-05-19 LAB — TSH: TSH: 1.63 u[IU]/mL (ref 0.35–5.50)

## 2012-05-19 LAB — FERRITIN: Ferritin: 151.2 ng/mL (ref 10.0–291.0)

## 2012-05-19 LAB — HEMOGLOBIN A1C: Hgb A1c MFr Bld: 6 % (ref 4.6–6.5)

## 2012-05-19 MED ORDER — FERROUS SULFATE 325 (65 FE) MG PO TABS: 325.0000 mg | ORAL_TABLET | Freq: Every day | ORAL | Status: DC

## 2012-05-19 MED ORDER — CYANOCOBALAMIN 1000 MCG/ML IJ SOLN: 1000.0000 ug | INTRAMUSCULAR | Status: DC

## 2012-05-19 MED ORDER — "SYRINGE/NEEDLE (DISP) 25G X 5/8"" 1 ML MISC": Status: DC

## 2012-05-19 MED ORDER — VITAMIN D 1000 UNITS PO TABS: 1000.0000 [IU] | ORAL_TABLET | Freq: Every day | ORAL | Status: DC

## 2012-05-19 NOTE — Assessment & Plan Note (Signed)
associated with osteopenia Unable to tolerate oral calcium because of history of kidney stone Recheck serum level now and consider prescription supplementation as needed

## 2012-05-19 NOTE — Patient Instructions (Signed)
It was good to see you today. We have reviewed your prior records including labs and tests today Test(s) ordered today. Your results will be released to MyChart (or called to you) after review, usually within 72hours after test completion. If any changes need to be made, you will be notified at that same time. Start iron tablet supplement (OTC) once daily for restless leg symptoms  Medications reviewed and updated, no changes at this time. Refill on medication(s) as discussed today. Please schedule followup in 6-12 months, call sooner if problems. Restless Legs Syndrome Restless legs syndrome is a movement disorder. It may also be called a sensori-motor disorder.   CAUSES   No one knows what specifically causes restless legs syndrome, but it tends to run in families. It is also more common in people with low iron, in pregnancy, in people who need dialysis, and those with nerve damage (neuropathy). Some medications may make restless legs syndrome worse. Those medications include drugs to treat high blood pressure, some heart conditions, nausea, colds, allergies, and depression. SYMPTOMS Symptoms include uncomfortable sensations in the legs. These leg sensations are worse during periods of inactivity or rest. They are also worse while sitting or lying down. Individuals that have the disorder describe sensations in the legs that feel like:  Pulling.   Drawing.   Crawling.   Worming.   Boring.   Tingling.   Pins and needles.   Prickling.   Pain.  The sensations are usually accompanied by an overwhelming urge to move the legs. Sudden muscle jerks may also occur. Movement provides temporary relief from the discomfort. In rare cases, the arms may also be affected. Symptoms may interfere with going to sleep (sleep onset insomnia). Restless legs syndrome may also be related to periodic limb movement disorder (PLMD). PLMD is another more common motor disorder. It also causes interrupted sleep.  The symptoms from PLMD usually occur most often when you are awake. TREATMENT   Treatment for restless legs syndrome is symptomatic. This means that the symptoms are treated.    Massage and cold compresses may provide temporary relief.   Walk, stretch, or take a cold or hot bath.   Get regular exercise and a good night's sleep.   Avoid caffeine, alcohol, nicotine, and medications that can make it worse.   Do activities that provide mental stimulation like discussions, needlework, and video games. These may be helpful if you are not able to walk or stretch.  Some medications are effective in relieving the symptoms. However, many of these medications have side effects. Ask your caregiver about medications that may help your symptoms. Correcting iron deficiency may improve symptoms for some patients. Document Released: 05/31/2002 Document Revised: 09/02/2011 Document Reviewed: 09/06/2010 [facility] Patient Information 2013 [place], [place].

## 2012-05-19 NOTE — Assessment & Plan Note (Signed)
Lab Results  Component Value Date   HGBA1C 6.0 05/19/2012   Diet controlled diabetes by A1c, dx 09/2011 improved from 6.23 September 2011 with weight loss and improved diet/exercise The current medical regimen is effective;  continue present plan and medications.   

## 2012-05-19 NOTE — Assessment & Plan Note (Signed)
11/2011 DEXA at [facility]: -1.9

## 2012-05-19 NOTE — Progress Notes (Signed)
Subjective:    Patient ID: Erica [name], female    DOB: 12-25-1940, 71 y.o.   MRN: [id number]  HPI New patient to me, here to establish care - prev followed at [facility] with Dr Eula Listen  complains of neuropathy, B feet - improved with monthly B12 injections begun Fall 2012 associated with cramping sensation, waking from sleep with pain and aching in legs (feet and calf) Not improved with hydration efforts Not associated with activity or overexertion - no claudication of joint swelling No weakness, no leg or back pain S/p neuro eval for same 02/2011: all test including NCS "normal except low B12" Has not tried other rx med for nocturnal leg cramping  Also reviewed other chronic medical issues: Osteopenia. DEXA performed April 2013 at [place]. Tolerance of vitamin D but declines calcium supplementation because of kidney stone history. No bony pain or spontaneous fracture. Prior humerus fracture from accidental fall 2006  Vitamin D deficiency. Has twice taken 4 week prescription of vitamin D 50K/wk -last course of therapy 6 months ago (april 2013)  Hyperglycemia. Reports "borderline diabetes" April 2013 her labs. Never recommended to be on medications for same. has diligently worked on weight loss with decreased caloric intake and increase aerobic activity since diagnosis -  B12 deficiency. Diagnosed fall 2012 -on home IM monthly replacement, requests refill on medication and supplies. Improvement in feet numbness and neuropathy symptoms since beginning therapy  Past Medical History  Diagnosis Date  . Diverticulosis   . Breast nodule   . Allergic rhinitis   . Arthritis   . Asthma    Family History  Problem Relation Age of Onset  . Heart disease Father   . Hyperlipidemia Father   . Hypertension Father   . Colon cancer Mother   . Arthritis Mother   . Cancer Mother     colon cancer  . Diabetes Brother   . Diabetes Paternal Aunt    History  Substance Use Topics  . Smoking status:  Never Smoker   . Smokeless tobacco: Never Used  . Alcohol Use: 1.8 oz/week    3 Glasses of wine per week   Review of Systems Constitutional: Negative for fever or weight change.  Respiratory: Negative for cough and shortness of breath.   Cardiovascular: Negative for chest pain or palpitations.  Gastrointestinal: Negative for abdominal pain, no bowel changes.  Musculoskeletal: Negative for gait problem or joint swelling.  Skin: Negative for rash.  Neurological: Negative for dizziness or headache.  No other specific complaints in a complete review of systems (except as listed in HPI above).     Objective:   Physical Exam BP 118/64  Pulse 79  Temp 97.8 F (36.6 C) (Oral)  Resp 14  Ht 5\' 4"  (1.626 m)  Wt 136 lb 8 oz (61.916 kg)  BMI 23.43 kg/m2  SpO2 96% Wt Readings from Last 3 Encounters:  05/19/12 136 lb 8 oz (61.916 kg)  01/14/12 141 lb (63.957 kg)  01/08/12 141 lb 6.4 oz (64.139 kg)   Constitutional: She appears well-developed and well-nourished. No distress.  HENT: Head: Normocephalic and atraumatic. Ears: B TMs ok, no erythema or effusion; Nose: Nose normal. Mouth/Throat: Oropharynx is clear and moist. No oropharyngeal exudate.  Eyes: Conjunctivae and EOM are normal. Pupils are equal, round, and reactive to light. No scleral icterus.  Neck: Normal range of motion. Neck supple. No JVD present. No thyromegaly present.  Cardiovascular: Normal rate, regular rhythm and normal heart sounds.  No murmur heard. No BLE edema.  Pulmonary/Chest: Effort normal and breath sounds normal. No respiratory distress. She has no wheezes.  Abdominal: Soft. Bowel sounds are normal. She exhibits no distension. There is no tenderness. no masses Musculoskeletal: Normal range of motion, no joint effusions. No gross deformities Neurological: She is alert and oriented to person, place, and time. No cranial nerve deficit. Coordination normal.  Skin: Skin is warm and dry. No rash noted. No erythema.    Psychiatric: She has a normal mood and affect. Her behavior is normal. Judgment and thought content normal.   Lab Results  Component Value Date   WBC 9.0 05/27/2009   HGB 15.0 05/27/2009   HCT 44.0 05/27/2009   PLT 308 05/27/2009   GLUCOSE 119* 05/27/2009   CHOL  Value: 167             01/24/2009   TRIG 81 01/24/2009   HDL 32* 01/24/2009   LDLCALC  Value: 119    01/24/2009   ALT 21 01/24/2009   AST 19 01/24/2009   NA 139 05/27/2009   K 4.0 05/27/2009   CL 108 05/27/2009   CREATININE 0.7 05/27/2009   BUN 14 05/27/2009   CO2 25 01/24/2009   TSH 3.278 * 01/24/2009   INR 1.0 01/24/2009       Assessment & Plan:   See problem list. Medications and labs reviewed today.  Time spent with pt today 45 minutes, greater than 50% time spent counseling patient on RLS, Vit D deficiency, hyperglycemia hx and ?diet DM and medication review. Also review of prior records - entered and canned into EMR today

## 2012-05-19 NOTE — Assessment & Plan Note (Signed)
Classic symptoms on history. Reports prior neuro eval including nerve conduction study normal in Fall 2012 Reviewed labs. Recheck iron and anemia now Recommend over-the-counter iron supplementation Consider prescription therapy if remains unimproved

## 2012-05-20 LAB — VITAMIN D 25 HYDROXY (VIT D DEFICIENCY, FRACTURES): Vit D, 25-Hydroxy: 33 ng/mL (ref 30–89)

## 2012-06-07 ENCOUNTER — Encounter: Payer: Self-pay | Admitting: Internal Medicine

## 2012-06-08 ENCOUNTER — Encounter: Payer: Self-pay | Admitting: Internal Medicine

## 2012-08-18 ENCOUNTER — Ambulatory Visit (INDEPENDENT_AMBULATORY_CARE_PROVIDER_SITE_OTHER): Payer: Medicare PPO | Admitting: Internal Medicine

## 2012-08-18 ENCOUNTER — Encounter: Payer: Self-pay | Admitting: Internal Medicine

## 2012-08-18 ENCOUNTER — Other Ambulatory Visit (INDEPENDENT_AMBULATORY_CARE_PROVIDER_SITE_OTHER): Payer: Medicare PPO

## 2012-08-18 VITALS — BP 118/62 | HR 73 | Temp 98.0°F | Wt 141.8 lb

## 2012-08-18 DIAGNOSIS — M2550 Pain in unspecified joint: Secondary | ICD-10-CM

## 2012-08-18 DIAGNOSIS — R5381 Other malaise: Secondary | ICD-10-CM

## 2012-08-18 DIAGNOSIS — R5383 Other fatigue: Secondary | ICD-10-CM

## 2012-08-18 DIAGNOSIS — R110 Nausea: Secondary | ICD-10-CM

## 2012-08-18 LAB — BASIC METABOLIC PANEL
BUN: 18 mg/dL (ref 6–23)
CO2: 28 mEq/L (ref 19–32)
Calcium: 9.1 mg/dL (ref 8.4–10.5)
Chloride: 108 mEq/L (ref 96–112)
Creatinine, Ser: 0.8 mg/dL (ref 0.4–1.2)
GFR: 76.73 mL/min (ref >60.00)
Glucose, Bld: 80 mg/dL (ref 70–99)
Potassium: 5.1 mEq/L (ref 3.5–5.1)
Sodium: 142 mEq/L (ref 135–145)

## 2012-08-18 LAB — HEPATIC FUNCTION PANEL
ALT: 15 U/L (ref 0–35)
AST: 15 U/L (ref 0–37)
Albumin: 3.8 g/dL (ref 3.5–5.2)
Alkaline Phosphatase: 55 U/L (ref 39–117)
Bilirubin, Direct: 0 mg/dL (ref 0.0–0.3)
Total Bilirubin: 0.6 mg/dL (ref 0.3–1.2)
Total Protein: 6.5 g/dL (ref 6.0–8.3)

## 2012-08-18 LAB — CBC WITH DIFFERENTIAL/PLATELET
Basophils Absolute: 0.1 10*3/uL (ref 0.0–0.1)
Basophils Relative: 0.9 % (ref 0.0–3.0)
Eosinophils Absolute: 0.2 10*3/uL (ref 0.0–0.7)
Eosinophils Relative: 2.7 % (ref 0.0–5.0)
HCT: 40.4 % (ref 36.0–46.0)
Hemoglobin: 13.8 g/dL (ref 12.0–15.0)
Lymphocytes Relative: 34.8 % (ref 12.0–46.0)
Lymphs Abs: 2.2 10*3/uL (ref 0.7–4.0)
MCHC: 34.2 g/dL (ref 30.0–36.0)
MCV: 89.3 fl (ref 78.0–100.0)
Monocytes Absolute: 0.4 10*3/uL (ref 0.1–1.0)
Monocytes Relative: 7 % (ref 3.0–12.0)
Neutro Abs: 3.5 10*3/uL (ref 1.4–7.7)
Neutrophils Relative %: 54.6 % (ref 43.0–77.0)
Platelets: 272 10*3/uL (ref 150.0–400.0)
RBC: 4.53 Mil/uL (ref 3.87–5.11)
RDW: 13.7 % (ref 11.5–14.6)
WBC: 6.4 10*3/uL (ref 4.5–10.5)

## 2012-08-18 LAB — TSH: TSH: 1.35 u[IU]/mL (ref 0.35–5.50)

## 2012-08-18 LAB — SEDIMENTATION RATE: Sed Rate: 16 mm/hr (ref 0–22)

## 2012-08-18 LAB — LIPASE: Lipase: 19 U/L (ref 11.0–59.0)

## 2012-08-18 MED ORDER — ACETAMINOPHEN 500 MG PO TABS: 500.0000 mg | ORAL_TABLET | Freq: Two times a day (BID) | ORAL | Status: DC

## 2012-08-18 NOTE — Progress Notes (Signed)
Subjective:    Patient ID: Erica [name], female    DOB: Nov 09, 1940, 72 y.o.   MRN: [id number]  HPI  See chief complaint Complains of postprandial nausea ongoing x3 weeks Precipitated by any oral intake and lasting about an hour after intake Not associated with vomiting, bowel changes or abdominal pain Denies any medication changes or sick contacts Associated with overwhelming physical fatigue  Also complains of pain: low back, both hips, both knees and both hands/wrist No prior diagnosis of arthritis Not using any over-the-counter medications for treatment of same Not associated with joint swelling or injury Symptoms worst in the morning or with inactivity  Past Medical History  Diagnosis Date  . Diverticulosis   . Breast nodule   . Allergic rhinitis   . Arthritis   . Asthma   . Diet-controlled type 2 diabetes mellitus 09/2011 dx    Review of Systems  Constitutional: Positive for fatigue. Negative for fever and unexpected weight change.  Respiratory: Negative for cough and shortness of breath.   Gastrointestinal: Positive for nausea. Negative for vomiting, abdominal pain, diarrhea and constipation.  Musculoskeletal: Positive for back pain and arthralgias. Negative for myalgias and joint swelling.       Objective:   Physical Exam BP 118/62  Pulse 73  Temp(Src) 98 F (36.7 C) (Oral)  Wt 141 lb 12.8 oz (64.32 kg)  BMI 24.33 kg/m2  SpO2 97% Wt Readings from Last 3 Encounters:  08/18/12 141 lb 12.8 oz (64.32 kg)  05/19/12 136 lb 8 oz (61.916 kg)  01/14/12 141 lb (63.957 kg)   Constitutional: She appears well-developed and well-nourished. No distress. fatigued but nontoxic Cardiovascular: Normal rate, regular rhythm and normal heart sounds.  No murmur heard. No BLE edema. Pulmonary/Chest: Effort normal and breath sounds normal. No respiratory distress. She has no wheezes.  Abdominal: Soft. Bowel sounds are normal. She exhibits no distension. There is no tenderness.  no masses Musculoskeletal: Normal range of motion, no joint effusions. No gross deformities - tender to palpation over left lateral hip consistent with L troch bursitis - Neurological: She is alert and oriented to person, place, and time. No cranial nerve deficit. Coordination normal.  Psychiatric: She has a dysphoric and dysthymic mood and affect. Her behavior is normal. Judgment and thought content normal.   Lab Results  Component Value Date   WBC 7.3 09/25/2011   HGB 13.8 09/25/2011   HCT 40 09/25/2011   PLT 283 09/25/2011   GLUCOSE 119* 05/27/2009   CHOL 179 09/25/2011   TRIG 177* 09/25/2011   HDL 37 09/25/2011   LDLCALC 409 09/25/2011   ALT 15 09/25/2011   AST 13 09/25/2011   NA 137 09/25/2011   K 4.1 09/25/2011   CL 108 05/27/2009   CREATININE 0.8 09/25/2011   BUN 17 09/25/2011   CO2 25 01/24/2009   TSH 1.63 05/19/2012   INR 1.0 01/24/2009   HGBA1C 6.0 05/19/2012       Assessment & Plan:   Nausea alone. Ongoing for 3 weeks. Not associated with vomiting, bowel change, fever or abdominal pain. No weight loss or other red flags on history or exam. Check screening labs and consider GI evaluation of post prandial symptoms  Arthralgias, diffuse. Also left trochanteric bursitis on exam. Suspect osteoarthritis as cause for knee, hip, low back and bilateral hand symptoms. Advised scheduled Tylenol for arthritis symptoms pending further GI evaluation. Consider anti-inflammatory if symptoms uncontrolled with Tylenol and no other GI issue identified. Also check rheumatoid factor with other  labs today, no clinically doubt autoimmune cause  Fatigue - nonspecific symptoms/exam - check screening labs. Suspect significant component of emotional fatigue and possible underlying dysphoria/depression. Discussed same with patient who concurs. Consider medical therapy if other evaluation unremarkable for physical illness

## 2012-08-18 NOTE — Patient Instructions (Signed)
It was good to see you today. We have reviewed your prior records including labs and tests today Test(s) ordered today. Your results will be released to MyChart (or called to you) after review, usually within 72hours after test completion. If any changes need to be made, you will be notified at that same time. Take extra strength Tylenol tablets 500 mg twice daily and every 6 hours as needed for arthritis pain ( over-the-counter) = we may consider other prescription medication depending on your results and response to this therapy No other prescription changes recommended at this time Take hycosamine only as needed for stomach cramps Please schedule followup in 6 weeks to review symptoms, call sooner if problems.

## 2012-08-19 LAB — RHEUMATOID FACTOR: Rhuematoid fact SerPl-aCnc: <10 IU/mL (ref <=14)

## 2012-08-24 ENCOUNTER — Encounter: Payer: Self-pay | Admitting: Internal Medicine

## 2012-09-28 ENCOUNTER — Ambulatory Visit (INDEPENDENT_AMBULATORY_CARE_PROVIDER_SITE_OTHER): Payer: Medicare PPO | Admitting: Internal Medicine

## 2012-09-28 ENCOUNTER — Encounter: Payer: Self-pay | Admitting: Internal Medicine

## 2012-09-28 ENCOUNTER — Other Ambulatory Visit (INDEPENDENT_AMBULATORY_CARE_PROVIDER_SITE_OTHER): Payer: Medicare PPO

## 2012-09-28 VITALS — BP 110/64 | HR 72 | Temp 98.2°F | Wt 140.1 lb

## 2012-09-28 DIAGNOSIS — L259 Unspecified contact dermatitis, unspecified cause: Secondary | ICD-10-CM

## 2012-09-28 DIAGNOSIS — N200 Calculus of kidney: Secondary | ICD-10-CM

## 2012-09-28 DIAGNOSIS — L309 Dermatitis, unspecified: Secondary | ICD-10-CM

## 2012-09-28 DIAGNOSIS — M1990 Unspecified osteoarthritis, unspecified site: Secondary | ICD-10-CM

## 2012-09-28 DIAGNOSIS — E119 Type 2 diabetes mellitus without complications: Secondary | ICD-10-CM

## 2012-09-28 LAB — HEMOGLOBIN A1C: Hgb A1c MFr Bld: 5.9 % (ref 4.6–6.5)

## 2012-09-28 LAB — URINALYSIS, ROUTINE W REFLEX MICROSCOPIC
Bilirubin Urine: NEGATIVE
Hgb urine dipstick: NEGATIVE
Leukocytes, UA: NEGATIVE
Nitrite: NEGATIVE
Specific Gravity, Urine: >=1.03 (ref 1.000–1.030)
Total Protein, Urine: NEGATIVE
Urine Glucose: NEGATIVE
Urobilinogen, UA: 0.2 (ref 0.0–1.0)
pH: 6 (ref 5.0–8.0)

## 2012-09-28 LAB — MICROALBUMIN / CREATININE URINE RATIO
Creatinine,U: 150.6 mg/dL
Microalb Creat Ratio: 0.7 mg/g (ref 0.0–30.0)
Microalb, Ur: 1.1 mg/dL (ref 0.0–1.9)

## 2012-09-28 MED ORDER — HYDROCORTISONE 1 % EX OINT: TOPICAL_OINTMENT | Freq: Two times a day (BID) | CUTANEOUS | Status: DC

## 2012-09-28 NOTE — Assessment & Plan Note (Signed)
symptoms greatly improved with tylenol prn The current medical regimen is effective;  continue present plan and medications.

## 2012-09-28 NOTE — Progress Notes (Signed)
  Subjective:    Patient ID: Erica [name], female    DOB: 1940-10-10, 72 y.o.   MRN: [id number]  HPI  Here for follow up  ?recurrent kidney stone - intermittent suprapubic pressure associated with cramping and discomfort typical of prior kidney stones -no fever or dysuria to suggest UTI Arthritis symptoms much improved with Tylenol as needed  Past Medical History  Diagnosis Date  . Diverticulosis   . Breast nodule   . Allergic rhinitis   . Arthritis   . Asthma   . Diet-controlled type 2 diabetes mellitus 09/2011 dx   Review of Systems  Constitutional: Negative for fever or weight change.  Respiratory: Negative for cough and shortness of breath.   Cardiovascular: Negative for chest pain or palpitations.       Objective:   Physical Exam  BP 110/64  Pulse 72  Temp(Src) 98.2 F (36.8 C) (Oral)  Wt 140 lb 1.9 oz (63.558 kg)  BMI 24.04 kg/m2  SpO2 97% Wt Readings from Last 3 Encounters:  09/28/12 140 lb 1.9 oz (63.558 kg)  08/18/12 141 lb 12.8 oz (64.32 kg)  05/19/12 136 lb 8 oz (61.916 kg)   Constitutional: She appears well-developed and well-nourished. No distress.   Neck: Normal range of motion. Neck supple. No JVD present. No thyromegaly present.  Cardiovascular: Normal rate, regular rhythm and normal heart sounds.  No murmur heard. No BLE edema. Pulmonary/Chest: Effort normal and breath sounds normal. No respiratory distress. She has no wheezes. Skin: eczema changes at waistband bilateral axillary line -mild excoriation associated with same. No other rash or erythema noted. Skin is warm and dry.  Psychiatric: She has a normal mood and affect. Her behavior is normal. Judgment and thought content normal.   Lab Results  Component Value Date   WBC 6.4 08/18/2012   HGB 13.8 08/18/2012   HCT 40.4 08/18/2012   PLT 272.0 08/18/2012   GLUCOSE 80 08/18/2012   CHOL 179 09/25/2011   TRIG 177* 09/25/2011   HDL 37 09/25/2011   LDLCALC 213 09/25/2011   ALT 15 08/18/2012   AST 15  08/18/2012   NA 142 08/18/2012   K 5.1 08/18/2012   CL 108 08/18/2012   CREATININE 0.8 08/18/2012   BUN 18 08/18/2012   CO2 28 08/18/2012   TSH 1.35 08/18/2012   INR 1.0 01/24/2009   HGBA1C 6.0 05/19/2012        Assessment & Plan:   See problem list. Medications and labs reviewed today.  Eczema. Education provided on same. Patient to review for possible trigger such as elastic waistband. Advised over-the-counter cortisone cream as needed

## 2012-09-28 NOTE — Assessment & Plan Note (Signed)
Lab Results  Component Value Date   HGBA1C 6.0 05/19/2012   Diet controlled diabetes by A1c, dx 09/2011 improved from 6.23 September 2011 with weight loss and improved diet/exercise The current medical regimen is effective;  continue present plan and medications.

## 2012-09-28 NOTE — Assessment & Plan Note (Signed)
Check UA given possible symptoms of recurrent dz Pt to call if worse or uncontrolled

## 2012-09-28 NOTE — Patient Instructions (Signed)
It was good to see you today. We have reviewed your prior records including labs and tests today Test(s) ordered today. Your results will be released to MyChart (or called to you) after review, usually within 72hours after test completion. If any changes need to be made, you will be notified at that same time. Medications reviewed and updated, no changes at this time.  Use over the counter hydrocortisone cream for eczema (itch and skin rash) as needed Please schedule followup in 6 months, call sooner if problems.

## 2012-10-07 ENCOUNTER — Encounter: Payer: Self-pay | Admitting: Internal Medicine

## 2012-10-11 ENCOUNTER — Emergency Department (HOSPITAL_COMMUNITY)
Admission: EM | Admit: 2012-10-11 | Discharge: 2012-10-12 | Disposition: A | Discharge status: Home / Self Care | Payer: Medicare PPO | Attending: Emergency Medicine | Admitting: Emergency Medicine

## 2012-10-11 ENCOUNTER — Emergency Department (HOSPITAL_COMMUNITY): Payer: Medicare PPO

## 2012-10-11 ENCOUNTER — Encounter: Payer: Self-pay | Admitting: Internal Medicine

## 2012-10-11 ENCOUNTER — Encounter (HOSPITAL_COMMUNITY): Payer: Self-pay | Admitting: *Deleted

## 2012-10-11 DIAGNOSIS — Z8742 Personal history of other diseases of the female genital tract: Secondary | ICD-10-CM | POA: Insufficient documentation

## 2012-10-11 DIAGNOSIS — Z8709 Personal history of other diseases of the respiratory system: Secondary | ICD-10-CM | POA: Insufficient documentation

## 2012-10-11 DIAGNOSIS — R110 Nausea: Secondary | ICD-10-CM | POA: Insufficient documentation

## 2012-10-11 DIAGNOSIS — M129 Arthropathy, unspecified: Secondary | ICD-10-CM | POA: Insufficient documentation

## 2012-10-11 DIAGNOSIS — M549 Dorsalgia, unspecified: Secondary | ICD-10-CM | POA: Insufficient documentation

## 2012-10-11 DIAGNOSIS — IMO0002 Reserved for concepts with insufficient information to code with codable children: Secondary | ICD-10-CM | POA: Insufficient documentation

## 2012-10-11 DIAGNOSIS — Z8719 Personal history of other diseases of the digestive system: Secondary | ICD-10-CM | POA: Insufficient documentation

## 2012-10-11 DIAGNOSIS — R21 Rash and other nonspecific skin eruption: Secondary | ICD-10-CM | POA: Insufficient documentation

## 2012-10-11 DIAGNOSIS — Z79899 Other long term (current) drug therapy: Secondary | ICD-10-CM | POA: Insufficient documentation

## 2012-10-11 DIAGNOSIS — N201 Calculus of ureter: Secondary | ICD-10-CM

## 2012-10-11 DIAGNOSIS — Z9889 Other specified postprocedural states: Secondary | ICD-10-CM | POA: Insufficient documentation

## 2012-10-11 DIAGNOSIS — J45909 Unspecified asthma, uncomplicated: Secondary | ICD-10-CM | POA: Insufficient documentation

## 2012-10-11 DIAGNOSIS — E119 Type 2 diabetes mellitus without complications: Secondary | ICD-10-CM | POA: Insufficient documentation

## 2012-10-11 LAB — URINE MICROSCOPIC-ADD ON

## 2012-10-11 LAB — URINALYSIS, ROUTINE W REFLEX MICROSCOPIC
Bilirubin Urine: NEGATIVE
Glucose, UA: NEGATIVE mg/dL
Ketones, ur: NEGATIVE mg/dL
Nitrite: NEGATIVE
Protein, ur: NEGATIVE mg/dL
Specific Gravity, Urine: 1.015 (ref 1.005–1.030)
Urobilinogen, UA: 0.2 mg/dL (ref 0.0–1.0)
pH: 5 (ref 5.0–8.0)

## 2012-10-11 MED ORDER — ONDANSETRON HCL 4 MG/2ML IJ SOLN
4.0000 mg | Freq: Once | INTRAMUSCULAR | Status: DC
  Filled 2012-10-11: qty 2

## 2012-10-11 MED ORDER — FENTANYL CITRATE 0.05 MG/ML IJ SOLN
50.0000 ug | Freq: Once | INTRAMUSCULAR | Status: DC
  Filled 2012-10-11: qty 2

## 2012-10-11 MED ORDER — KETOROLAC TROMETHAMINE 30 MG/ML IJ SOLN
15.0000 mg | Freq: Once | INTRAMUSCULAR | Status: AC
  Administered 2012-10-11: 15 mg via INTRAVENOUS
  Filled 2012-10-11: qty 1

## 2012-10-11 NOTE — ED Provider Notes (Signed)
History     CSN: [id number]  Arrival date & time 10/11/12  2106   First MD Initiated Contact with Patient 10/11/12 2300      Chief Complaint  Patient presents with  . Flank Pain    left  . Abdominal Pain    left    (Consider location/radiation/quality/duration/timing/severity/associated sxs/prior treatment) HPI Comments: Patient reports that she developed left low back and flank pain rather acutely this evening with radiation into her left abdomen both upper and mostly lower and has some residual discomfort across her suprapubic region. She reports she did get acutely nauseated, seems improved, no vomiting. She reports that she's had several episodes similar to this dating back many years, thinks she was evaluated about 20 years ago, recalls seeing images would explain to her that her left ureter was much smaller and narrower than her right, but she does not recall what kind of physician did this procedure and what they were looking for at that time. She denies prior history of kidney infection, has had urinary tract infections in the past, denies any history of kidney stones. She reports her oral temperature was slightly higher than usual at 99, but no overt chills. She did drink a lot of water to try and hydrate which she has used in the past to alleviate her symptoms and also reports she had a couple of loose stools today but no frank diarrhea. She recently saw her primary care physician a couple months ago it did mention the symptoms. She knows that they check some labs and a urine test and was told overall that everything looked fine. She also developed a dry scaly rash on both of her flanks and was prescribed some sort of skin cream that she just received today. She denies any burning or severe itching of her skin worse than usual. She denies any sick contacts. She reports no prior history of abdominal surgeries other than a D&C many many years ago  Patient is a 72 y.o. female presenting  with flank pain and abdominal pain. The history is provided by the patient, the spouse and medical records.  Flank Pain Associated symptoms include abdominal pain. Pertinent negatives include no chest pain and no shortness of breath.  Abdominal Pain Associated symptoms: nausea   Associated symptoms: no chest pain, no chills, no constipation, no diarrhea, no dysuria, no fever, no shortness of breath, no vaginal bleeding, no vaginal discharge and no vomiting     Past Medical History  Diagnosis Date  . Diverticulosis   . Breast nodule   . Allergic rhinitis   . Arthritis   . Asthma   . Diet-controlled type 2 diabetes mellitus 09/2011 dx    Past Surgical History  Procedure Laterality Date  . Tonsillectomy and adenoidectomy    . Dilation and curettage of uterus      Family History  Problem Relation Age of Onset  . Heart disease Father   . Hyperlipidemia Father   . Hypertension Father   . Colon cancer Mother   . Arthritis Mother   . Cancer Mother     colon cancer  . Diabetes Brother   . Diabetes Paternal Aunt     History  Substance Use Topics  . Smoking status: Never Smoker   . 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[place] but in [place] since 1988  . Alcohol Use: 1.8 oz/week    3 Glasses of  wine per week    OB History   Grav Para Term Preterm Abortions TAB SAB Ect Mult Living                  Review of Systems  Constitutional: Negative for fever and chills.  Respiratory: Negative for shortness of breath.   Cardiovascular: Negative for chest pain.  Gastrointestinal: Positive for nausea and abdominal pain. Negative for vomiting, diarrhea, constipation and blood in stool.  Genitourinary: Positive for flank pain. Negative for dysuria, frequency, decreased urine volume, vaginal bleeding, vaginal discharge, vaginal pain and pelvic pain.  Musculoskeletal: Positive for back pain.  Skin: Positive for  rash. Negative for color change and wound.  All other systems reviewed and are negative.    Allergies  Review of patient's allergies indicates no known allergies.  Home Medications   Current Outpatient Rx  Name  Route  Sig  Dispense  Refill  . acetaminophen (TYLENOL) 500 MG tablet   Oral   Take 1 tablet (500 mg total) by mouth 2 (two) times daily. And 1 tab every 6 hours as needed for arthritis pain   30 tablet   0   . Biotin 1000 MCG tablet   Oral   Take 1,000 mcg by mouth daily.         . cetirizine (ZYRTEC) 10 MG tablet   Oral   Take 10 mg by mouth daily.         . cholecalciferol (VITAMIN D) 1000 UNITS tablet   Oral   Take 1 tablet (1,000 Units total) by mouth daily.         . cyanocobalamin (,VITAMIN B-12,) 1000 MCG/ML injection   Intramuscular   Inject 1 mL (1,000 mcg total) into the muscle every 30 (thirty) days.   1 mL   5   . ferrous sulfate 325 (65 FE) MG tablet   Oral   Take 1 tablet (325 mg total) by mouth daily with breakfast.   30 tablet   3   . hydrocortisone 1 % ointment   Topical   Apply topically 2 (two) times daily.   30 g   0   . Probiotic Product (ALIGN) 4 MG CAPS   Oral   Take 1 capsule by mouth daily.   7 capsule   0   . HYDROcodone-acetaminophen (NORCO/VICODIN) 5-325 MG per tablet      1-2 tablets po q 6 hours prn moderate to severe pain   20 tablet   0   . ondansetron (ZOFRAN-ODT) 8 MG disintegrating tablet   Oral   Take 1 tablet (8 mg total) by mouth every 12 (twelve) hours as needed for nausea.   20 tablet   0     BP 141/73  Pulse 72  Temp(Src) 98.6 F (37 C) (Oral)  Resp 18  Ht 5\' 4"  (1.626 m)  Wt 142 lb 3.2 oz (64.501 kg)  BMI 24.4 kg/m2  SpO2 99%  Physical Exam  Nursing note and vitals reviewed. Constitutional: She appears well-developed and well-nourished.  HENT:  Head: Normocephalic and atraumatic.  Eyes: EOM are normal. No scleral icterus.  Cardiovascular: Normal rate, regular rhythm and intact  distal pulses.   Pulmonary/Chest: Effort normal. No respiratory distress.  Abdominal: Soft. Normal appearance and bowel sounds are normal. There is no splenomegaly. There is tenderness in the suprapubic area, left upper quadrant and left lower quadrant. There is CVA tenderness. There is no rebound and no guarding.  Musculoskeletal: She exhibits no edema and  no tenderness.  Neurological: She is alert. She exhibits normal muscle tone. Coordination normal.  Skin: Skin is warm, dry and intact. Rash noted. Rash is maculopapular. She is not diaphoretic. No pallor.  Small patch of dry, scaly skin with noted significant erythema at point of left and right flanks.    ED Course  Procedures (including critical care time)  Labs Reviewed  URINALYSIS, ROUTINE W REFLEX MICROSCOPIC - Abnormal; Notable for the following:    Hgb urine dipstick MODERATE (*)    Leukocytes, UA SMALL (*)    All other components within normal limits  CBC WITH DIFFERENTIAL - Abnormal; Notable for the following:    WBC 13.8 (*)    Neutrophils Relative 80 (*)    Neutro Abs 11.0 (*)    All other components within normal limits  COMPREHENSIVE METABOLIC PANEL - Abnormal; Notable for the following:    Glucose, Bld 104 (*)    Total Bilirubin 0.2 (*)    GFR calc non Af Amer 58 (*)    GFR calc Af Amer 67 (*)    All other components within normal limits  LIPASE, BLOOD  URINE MICROSCOPIC-ADD ON   Ct Abdomen Pelvis Wo Contrast  10/12/2012  *RADIOLOGY REPORT*  Clinical Data: Left flank and lower abdominal pain  CT ABDOMEN AND PELVIS WITHOUT CONTRAST  Technique:  Multidetector CT imaging of the abdomen and pelvis was performed following the standard protocol without intravenous contrast.  Comparison: None.  Findings: Limited images through the lung bases demonstrate no significant appreciable abnormality. The heart size is within normal limits. No pleural or pericardial effusion.  Organ abnormality/lesion detection is limited in the  absence of intravenous contrast. Within this limitation, there are a couple hypodensities within the liver, too small to further characterize. Unremarkable biliary system, spleen, pancreas, adrenal glands.  Left perinephric fat stranding and minimal hydroureteronephrosis to the level of a 3 x 7 mm (transverse by craniocaudal) stone or stones at the left UVJ.  The distal left ureter looks mildly thickened as appreciated on coronal images 49 and 48.  Colonic diverticulosis.  No CT evidence for colitis or diverticulitis.  Normal appendix.  Small bowel loops are normal course and caliber. No free intraperitoneal air or fluid.  No lymphadenopathy.  There is scattered atherosclerotic calcification of the aorta and its branches. No aneurysmal dilatation.  Thin-walled bladder.  Unremarkable CT appearance to the uterus and adnexa.  Osteopenia and multilevel degenerative changes.  No acute osseous finding.  IMPRESSION: Left perinephric fat stranding and mild hydroureteronephrosis to the level of a 3 x 7 mm stone at the UVJ.  Thickening of the distal left ureter may be reactive however warrants attention with a short- term follow-up (ideally contrast enhanced CT urogram) after symptoms resolve.   Original Report Authenticated By: Jearld Lesch, M.D.      1. Ureteral stone     Room air saturation 99% which I interpret to be normal    1:33 AM CT shows stone, needs urology follow up.  UA doesn't suggest infection, no fevers, WBC is up, but non specific in my opinion.  Culture is sent. Pt feels improved, will refer to [facility] and also recommend PCP follow up as well.    MDM   Patient with left flank pain with radiation and some mild tenderness to her diffuse left abdomen and suprapubic region. No rebound tenderness. Patient's vital signs are normal. Patient with prior episodes of the same without discrete diagnosis. Differential includes mild diverticulitis versus ureteral  or renal colic. Given her history of  ureteral narrowing, and left CVA tenderness, higher differential for ureteral colic. Noncontrast CT of her abdomen and pelvis is ordered. IV antiemetics and analgesics are ordered as well.        Gavin Pound. Oletta Lamas, MD 10/12/12 1610

## 2012-10-11 NOTE — ED Notes (Signed)
Pt presents to the ED with c/o left flank, left lower abdomen and genital pain. Pt denies dysuria, hematuria. Pt states she has a hx of kidney stones. A&Ox4, skin warm and dry, respirations equal and unlabored.

## 2012-10-11 NOTE — ED Notes (Signed)
2 unsuccessful IV attempts.

## 2012-10-12 ENCOUNTER — Encounter: Payer: Self-pay | Admitting: Internal Medicine

## 2012-10-12 LAB — CBC WITH DIFFERENTIAL/PLATELET
Basophils Absolute: 0.1 10*3/uL (ref 0.0–0.1)
Basophils Relative: 0 % (ref 0–1)
Eosinophils Absolute: 0.1 10*3/uL (ref 0.0–0.7)
Eosinophils Relative: 1 % (ref 0–5)
HCT: 41.3 % (ref 36.0–46.0)
Hemoglobin: 14.2 g/dL (ref 12.0–15.0)
Lymphocytes Relative: 14 % (ref 12–46)
Lymphs Abs: 2 10*3/uL (ref 0.7–4.0)
MCH: 30.5 pg (ref 26.0–34.0)
MCHC: 34.4 g/dL (ref 30.0–36.0)
MCV: 88.6 fL (ref 78.0–100.0)
Monocytes Absolute: 0.7 10*3/uL (ref 0.1–1.0)
Monocytes Relative: 5 % (ref 3–12)
Neutro Abs: 11 10*3/uL — ABNORMAL HIGH (ref 1.7–7.7)
Neutrophils Relative %: 80 % — ABNORMAL HIGH (ref 43–77)
Platelets: 259 10*3/uL (ref 150–400)
RBC: 4.66 MIL/uL (ref 3.87–5.11)
RDW: 13 % (ref 11.5–15.5)
WBC: 13.8 10*3/uL — ABNORMAL HIGH (ref 4.0–10.5)

## 2012-10-12 LAB — COMPREHENSIVE METABOLIC PANEL
ALT: 15 U/L (ref 0–35)
AST: 19 U/L (ref 0–37)
Albumin: 4.1 g/dL (ref 3.5–5.2)
Alkaline Phosphatase: 61 U/L (ref 39–117)
BUN: 17 mg/dL (ref 6–23)
CO2: 24 mEq/L (ref 19–32)
Calcium: 9.5 mg/dL (ref 8.4–10.5)
Chloride: 103 mEq/L (ref 96–112)
Creatinine, Ser: 0.95 mg/dL (ref 0.50–1.10)
GFR calc Af Amer: 67 mL/min — ABNORMAL LOW (ref >90)
GFR calc non Af Amer: 58 mL/min — ABNORMAL LOW (ref >90)
Glucose, Bld: 104 mg/dL — ABNORMAL HIGH (ref 70–99)
Potassium: 4 mEq/L (ref 3.5–5.1)
Sodium: 136 mEq/L (ref 135–145)
Total Bilirubin: 0.2 mg/dL — ABNORMAL LOW (ref 0.3–1.2)
Total Protein: 7.1 g/dL (ref 6.0–8.3)

## 2012-10-12 LAB — LIPASE, BLOOD: Lipase: 19 U/L (ref 11–59)

## 2012-10-12 MED ORDER — ONDANSETRON 8 MG PO TBDP: 8.0000 mg | ORAL_TABLET | Freq: Two times a day (BID) | ORAL | Status: DC | PRN

## 2012-10-12 MED ORDER — HYDROCODONE-ACETAMINOPHEN 5-325 MG PO TABS: ORAL_TABLET | ORAL | Status: DC

## 2012-10-12 NOTE — ED Notes (Signed)
Patient transported to CT 

## 2012-10-12 NOTE — Discharge Instructions (Signed)
 Kidney Stones Kidney stones (ureteral lithiasis) are deposits that form inside your kidneys. The intense pain is caused by the stone moving through the urinary tract. When the stone moves, the ureter goes into spasm around the stone. The stone is usually passed in the urine.  CAUSES   A disorder that makes certain neck glands produce too much parathyroid hormone (primary hyperparathyroidism).  A buildup of uric acid crystals.  Narrowing (stricture) of the ureter.  A kidney obstruction present at birth (congenital obstruction).  Previous surgery on the kidney or ureters.  Numerous kidney infections. SYMPTOMS   Feeling sick to your stomach (nauseous).  Throwing up (vomiting).  Blood in the urine (hematuria).  Pain that usually spreads (radiates) to the groin.  Frequency or urgency of urination. DIAGNOSIS   Taking a history and physical exam.  Blood or urine tests.  Computerized X-ray scan (CT scan).  Occasionally, an examination of the inside of the urinary bladder (cystoscopy) is performed. TREATMENT   Observation.  Increasing your fluid intake.  Surgery may be needed if you have severe pain or persistent obstruction. The size, location, and chemical composition are all important variables that will determine the proper choice of action for you. Talk to your caregiver to better understand your situation so that you will minimize the risk of injury to yourself and your kidney.  HOME CARE INSTRUCTIONS   Drink enough water and fluids to keep your urine clear or pale yellow.  Strain all urine through the provided strainer. Keep all particulate matter and stones for your caregiver to see. The stone causing the pain may be as small as a grain of salt. It is very important to use the strainer each and every time you pass your urine. The collection of your stone will allow your caregiver to analyze it and verify that a stone has actually passed.  Only take over-the-counter or  prescription medicines for pain, discomfort, or fever as directed by your caregiver.  Make a follow-up appointment with your caregiver as directed.  Get follow-up X-rays if required. The absence of pain does not always mean that the stone has passed. It may have only stopped moving. If the urine remains completely obstructed, it can cause loss of kidney function or even complete destruction of the kidney. It is your responsibility to make sure X-rays and follow-ups are completed. Ultrasounds of the kidney can show blockages and the status of the kidney. Ultrasounds are not associated with any radiation and can be performed easily in a matter of minutes. SEEK IMMEDIATE MEDICAL CARE IF:   Pain cannot be controlled with the prescribed medicine.  You have a fever.  The severity or intensity of pain increases over 18 hours and is not relieved by pain medicine.  You develop a new onset of abdominal pain.  You feel faint or pass out. MAKE SURE YOU:   Understand these instructions.  Will watch your condition.  Will get help right away if you are not doing well or get worse. Document Released: 06/10/2005 Document Revised: 09/02/2011 Document Reviewed: 10/06/2009 [facility] Patient Information 2013 [place], [place].      Diet for Kidney Stones Kidney stones are small, hard masses that form inside your kidneys. They are made up of salts and minerals and often form when high levels build up in the urine. The minerals can then start to build up, crystalize, and stick together to form stones. There are several different types of kidney stones. The following types of stones may be  influenced by dietary factors:   Calcium  Oxalate Stones. An oxalate is a salt found in certain foods. Within the body, calcium  can combine with oxalates to form calcium  oxalate stones, which can be excreted in the urine in high amounts. This is the most common type of kidney stone.  Calcium  Phosphate Stones. These stones  may occur when the pH of the urine becomes too high, or less acidic, from too much calcium  being excreted in the urine. The pH is a measure of how acidic or basic a substance is.  Uric Acid Stones. This type of stone occurs when the pH of the urine becomes too low, or very acidic, because substances called purines build up in the urine. Purines are found in animal proteins. When the urine is highly concentrated with acid, uric acid kidney stones can form.  Other risk factors for kidney stones include genetics, environment, and being overweight. Your caregiver may ask you to follow specific diet guidelines based on the type of stone you have to lessen the chances of your body making more kidney stones.  GENERAL GUIDELINES FOR ALL TYPES OF STONES  Drink plenty of fluid. Drink 12 16 cups of fluid a day, drinking mainly water.This is the most important thing you can do to prevent the formation of future kidney stones.  Maintain a healthy weight. Your caregiver or dietitian can help you determine what a healthy weight is for you. If you are overweight, weight loss may help prevent the formation of future kidney stones.  Eat a diet adequate in animal protein. Too much animal protein can contribute to the formation of stones. Your dietitian can help you determine how much protein you should be eating. Avoid low carbohydrate, high protein diets.  Follow a balanced eating approach. The DASH diet, which stands for Dietary Approaches to Stop Hypertension, is an effective meal plan for reducing stone formation. This diet is high in fruits, vegetables, dairy, and whole grains and low in animal protein. Ask your caregiver or dietitian for information about the DASH diet. ADDITIONAL DIET GUIDELINES FOR CALCIUM  STONES Avoid foods high in salt. This includes table salt, salt seasonings, MSG, soy sauce, cured and processed meats, salted crackers and snack foods, fast food, and canned soups and foods. Ask your  caregiver or dietitian for information about reducing sodium in your diet or following the low sodium diet.  Ensure adequate calcium  intake. Use the following table for calcium  guidelines:  Men 65 years old and younger  1000 mg/day.  Men 77 years old and older  1500 mg/day.  Women 24 74 years old  1000 mg/day.  Women 50 years and older  1500 mg/day. Your dietitian can help you determine if you are getting enough calcium  in your diet. Foods that are high in calcium  include dairy products, broccoli, cheese, yogurt, and pudding. If you need to take a calcium  supplement, take it only in the form of calcium  citrate.  Avoid foods high in oxalate. Be sure that any supplements you take do not contain more than 500 mg of vitamin C. Vitamin C is converted into oxalate in the body. You do not need to avoid fruits and vegetables high in vitamin C.   Grains: High-fiber or bran cereal, whole-wheat bread, grits, barley, buckwheat, amaranth, pretzels, and fruitcake.  Vegetables: Dried beans, wax beans, dark leafy greens, eggplant, leeks, okra, parsley, rutabaga, tomato paste, watercress, zucchini, and escarole.  Fruit: Dried apricots, red currants, figs, kiwi, and rhubarb.  Meat and Meat Substitutes: Soybeans  and foods made from soy (soyburger, miso), dried beans, peanut butter.  Milk: Chocolate milk mixes and soymilk.  Fats and Oils: Nuts (peanuts, almonds, pecans, cashews, hazelnuts) and nut butters, sesame seeds, and tDahini paste.  Condiments/Miscellaneous: Chocolate, carob, marmalade, poppy seeds, instant iced tea, and juice from high-oxalate fruits.  Document Released: 10/05/2010 Document Revised: 12/10/2011 Document Reviewed: 11/25/2011 [facility] Patient Information 2013 [place], [place].     Narcotic and benzodiazepine use may cause drowsiness, slowed breathing or dependence.  Please use with caution and do not drive, operate machinery or watch young children alone while taking them.  Taking  combinations of these medications or drinking alcohol will potentiate these effects.

## 2012-10-13 ENCOUNTER — Telehealth: Payer: Self-pay | Admitting: *Deleted

## 2012-10-13 NOTE — Telephone Encounter (Signed)
Pt drop off some records & CD. MD wanted to let pt know that she received info, but she is not able to keep CD. Pt to come pick CD back up. Called pt no answer LMOM will leave CD up front for pick-up...Raechel Chute

## 2012-10-28 ENCOUNTER — Encounter: Payer: Self-pay | Admitting: Internal Medicine

## 2012-10-28 ENCOUNTER — Ambulatory Visit (INDEPENDENT_AMBULATORY_CARE_PROVIDER_SITE_OTHER): Payer: Medicare PPO | Admitting: Internal Medicine

## 2012-10-28 VITALS — BP 110/72 | HR 65 | Temp 97.8°F | Wt 140.0 lb

## 2012-10-28 DIAGNOSIS — N200 Calculus of kidney: Secondary | ICD-10-CM

## 2012-10-28 DIAGNOSIS — E119 Type 2 diabetes mellitus without complications: Secondary | ICD-10-CM

## 2012-10-28 NOTE — Patient Instructions (Signed)
It was good to see you today. We have reviewed your prior records including labs and tests today Medications reviewed and updated, no changes at this time.  Please call if any fever, increasing pain, nausea or other symptoms develop prior to passing the kidney stone Please Keep scheduled followup, call sooner if problems.

## 2012-10-28 NOTE — Assessment & Plan Note (Signed)
Lab Results  Component Value Date   HGBA1C 5.9 09/28/2012   Diet controlled diabetes by A1c, dx 09/2011 improved from 6.23 September 2011 with weight loss and improved diet/exercise The current medical regimen is effective;  continue present plan and medications.

## 2012-10-28 NOTE — Progress Notes (Signed)
  Subjective:    Patient ID: Erica [name], female    DOB: December 06, 1940, 72 y.o.   MRN: [id number]  HPI  Here for ER follow up - 4/20 [facility] ER visit for recurrent L kidney stone symptoms    Past Medical History  Diagnosis Date  . Diverticulosis   . Breast nodule   . Allergic rhinitis   . Arthritis   . Asthma   . Diet-controlled type 2 diabetes mellitus 09/2011 dx  . Recurrent kidney stones    Review of Systems  Constitutional: Negative for fever, chills and fatigue.  Respiratory: Negative for cough and shortness of breath.   Cardiovascular: Negative for chest pain and palpitations.  Genitourinary: Positive for pelvic pain ("twinges"). Negative for dysuria, urgency, hematuria, flank pain, decreased urine volume, difficulty urinating and vaginal pain.        Objective:   Physical Exam  BP 110/72  Pulse 65  Temp(Src) 97.8 F (36.6 C) (Oral)  Wt 140 lb (63.504 kg)  BMI 24.02 kg/m2  SpO2 98% Wt Readings from Last 3 Encounters:  10/28/12 140 lb (63.504 kg)  10/11/12 142 lb 3.2 oz (64.501 kg)  09/28/12 140 lb 1.9 oz (63.558 kg)   Constitutional: She appears well-developed and well-nourished. No distress.   Neck: Normal range of motion. Neck supple. No JVD present. No thyromegaly present.  Cardiovascular: Normal rate, regular rhythm and normal heart sounds.  No murmur heard. No BLE edema. Pulmonary/Chest: Effort normal and breath sounds normal. No respiratory distress. She has no wheezes. Abdomen: SNT+BS, no R/G - no flank tenderness to palpation  Lab Results  Component Value Date   WBC 13.8* 10/11/2012   HGB 14.2 10/11/2012   HCT 41.3 10/11/2012   PLT 259 10/11/2012   GLUCOSE 104* 10/11/2012   CHOL 179 09/25/2011   TRIG 177* 09/25/2011   HDL 37 09/25/2011   LDLCALC 147 09/25/2011   ALT 15 10/11/2012   AST 19 10/11/2012   NA 136 10/11/2012   K 4.0 10/11/2012   CL 103 10/11/2012   CREATININE 0.95 10/11/2012   BUN 17 10/11/2012   CO2 24 10/11/2012   TSH 1.35 08/18/2012   INR 1.0  01/24/2009   HGBA1C 5.9 09/28/2012   MICROALBUR 1.1 09/28/2012   Ct Abdomen Pelvis Wo Contrast  10/12/2012  *RADIOLOGY REPORT*  Clinical Data: Left flank and lower abdominal pain    IMPRESSION: Left perinephric fat stranding and mild hydroureteronephrosis to the level of a 3 x 7 mm stone at the UVJ.  Thickening of the distal left ureter may be reactive however warrants attention with a short- term follow-up (ideally contrast enhanced CT urogram) after symptoms resolve.   Original Report Authenticated By: Jearld Lesch, M.D.       Assessment & Plan:   See problem list. Medications and labs reviewed today.  Time spent with pt today 25 minutes, greater than 50% time spent counseling patient on kidney stones and medication review. Also review of ER records

## 2012-12-08 ENCOUNTER — Encounter: Payer: Self-pay | Admitting: Internal Medicine

## 2012-12-08 DIAGNOSIS — D2230 Melanocytic nevi of unspecified part of face: Secondary | ICD-10-CM

## 2012-12-14 ENCOUNTER — Encounter: Payer: Self-pay | Admitting: Internal Medicine

## 2012-12-14 DIAGNOSIS — N200 Calculus of kidney: Secondary | ICD-10-CM

## 2012-12-17 ENCOUNTER — Encounter: Payer: Self-pay | Admitting: Internal Medicine

## 2012-12-17 ENCOUNTER — Telehealth: Payer: Self-pay | Admitting: *Deleted

## 2012-12-17 DIAGNOSIS — N200 Calculus of kidney: Secondary | ICD-10-CM

## 2012-12-17 NOTE — Telephone Encounter (Signed)
Ok - referral ordered as requested

## 2012-12-17 NOTE — Telephone Encounter (Signed)
Dr. Felicity Coyer pt does not want to go to [facility]. She want to see a female md. She states [facility] in [place] have 2 female md Dr. Ileana Ladd & Dr. Coralyn Pear [phone]. Requesting referral to go there...Raechel Chute

## 2012-12-17 NOTE — Telephone Encounter (Signed)
Sent pt email back stating once referral has been set up will received call from [facility]...Erica [name]

## 2013-01-04 ENCOUNTER — Other Ambulatory Visit: Payer: Self-pay | Admitting: Internal Medicine

## 2013-02-16 ENCOUNTER — Ambulatory Visit: Payer: Medicare Other | Admitting: Internal Medicine

## 2013-02-17 LAB — LIPID PANEL: LDL Cholesterol: 124 mg/dL

## 2013-02-23 ENCOUNTER — Encounter: Payer: Self-pay | Admitting: Internal Medicine

## 2013-02-24 ENCOUNTER — Ambulatory Visit (INDEPENDENT_AMBULATORY_CARE_PROVIDER_SITE_OTHER): Payer: Medicare PPO | Admitting: Internal Medicine

## 2013-02-24 ENCOUNTER — Encounter: Payer: Self-pay | Admitting: Internal Medicine

## 2013-02-24 VITALS — BP 100/54 | HR 74 | Temp 98.5°F | Wt 138.4 lb

## 2013-02-24 DIAGNOSIS — I951 Orthostatic hypotension: Secondary | ICD-10-CM

## 2013-02-24 DIAGNOSIS — E119 Type 2 diabetes mellitus without complications: Secondary | ICD-10-CM

## 2013-02-24 DIAGNOSIS — Z23 Encounter for immunization: Secondary | ICD-10-CM

## 2013-02-24 DIAGNOSIS — E559 Vitamin D deficiency, unspecified: Secondary | ICD-10-CM

## 2013-02-24 DIAGNOSIS — E785 Hyperlipidemia, unspecified: Secondary | ICD-10-CM

## 2013-02-24 DIAGNOSIS — I959 Hypotension, unspecified: Secondary | ICD-10-CM

## 2013-02-24 MED ORDER — TYPHOID VACCINE PO CPDR: 1.0000 | DELAYED_RELEASE_CAPSULE | ORAL | Status: DC

## 2013-02-24 NOTE — Assessment & Plan Note (Signed)
associated with osteopenia Unable to tolerate oral calcium because of history of recurrent kidney stones Recheck serum level now and consider prescription supplementation as needed

## 2013-02-24 NOTE — Assessment & Plan Note (Signed)
Lab Results  Component Value Date   HGBA1C 5.9 09/28/2012   Diet controlled diabetes by A1c, dx 09/2011 improved from 6.23 September 2011 with weight loss and improved diet/exercise Lipid goal LDL <100 - recheck now The current medical regimen is effective;  continue present plan and medications.

## 2013-02-24 NOTE — Patient Instructions (Addendum)
It was good to see you today. We have reviewed your prior records including labs and tests today Test(s) ordered today.Return when you're fasting for your lipid and vitamin D. Your results will be released to MyChart (or called to you) after review, usually within 72hours after test completion. If any changes need to be made, you will be notified at that same time. Hepatitis A and flu shot administered today. Prescription given for typhoid vaccine for you to fill and begin prior to travel we'll make referral for cardiac ultrasound to evaluate low blood pressure in the setting of positional dizziness. Our office will contact you regarding appointment(s) once made. followup in 6 months for review of cholesterol and sugars, call sooner if problems

## 2013-02-24 NOTE — Progress Notes (Signed)
  Subjective:    Patient ID: Erica [name], female    DOB: March 26, 1941, 72 y.o.   MRN: [id number]  HPI Here for follow up - reviewed chronic medical issues and current concerns ?low BP - postural hypotension on occasion - ?relation to same - denies syncope, exertional dizziness  Also needs travel immunizations for [place] trip - Hep A and typhoid   ?LDL too high - requests recheck labs when fasting with Vit D  Past Medical History  Diagnosis Date  . Diverticulosis   . Breast nodule   . Allergic rhinitis   . Arthritis   . Asthma   . Diet-controlled type 2 diabetes mellitus 09/2011 dx  . Recurrent kidney stones    Review of Systems  Constitutional: Negative for fever, fatigue and unexpected weight change.  Respiratory: Negative for cough and shortness of breath.   Cardiovascular: Negative for chest pain, palpitations and leg swelling.  Neurological: Negative for syncope and weakness.        Objective:   Physical Exam BP 100/54  Pulse 74  Temp(Src) 98.5 F (36.9 C) (Oral)  Wt 138 lb 6.4 oz (62.778 kg)  BMI 23.74 kg/m2  SpO2 97% Wt Readings from Last 3 Encounters:  02/24/13 138 lb 6.4 oz (62.778 kg)  10/28/12 140 lb (63.504 kg)  10/11/12 142 lb 3.2 oz (64.501 kg)   Constitutional: She appears well-developed and well-nourished. No distress.   Neck: Normal range of motion. Neck supple. No JVD present. No thyromegaly present.  Cardiovascular: Normal rate, regular rhythm and normal heart sounds.  No murmur heard. No BLE edema. Pulmonary/Chest: Effort normal and breath sounds normal. No respiratory distress. She has no wheezes.   Lab Results  Component Value Date   WBC 13.8* 10/11/2012   HGB 14.2 10/11/2012   HCT 41.3 10/11/2012   PLT 259 10/11/2012   GLUCOSE 104* 10/11/2012   CHOL 179 09/25/2011   TRIG 177* 09/25/2011   HDL 37 09/25/2011   LDLCALC 409 09/25/2011   ALT 15 10/11/2012   AST 19 10/11/2012   NA 136 10/11/2012   K 4.0 10/11/2012   CL 103 10/11/2012   CREATININE 0.95  10/11/2012   BUN 17 10/11/2012   CO2 24 10/11/2012   TSH 1.35 08/18/2012   INR 1.0 01/24/2009   HGBA1C 5.9 09/28/2012   MICROALBUR 1.1 09/28/2012       Assessment & Plan:   See problem list. Medications and labs reviewed today.  Time spent with pt today 25 minutes, greater than 50% time spent counseling patient on travel plans, LDL goal and medication review.   Borderline hypotension with postural orthostatic symptoms. Check 2-D echo. Hemodynamically stable and asymptomatic today

## 2013-02-25 ENCOUNTER — Other Ambulatory Visit (INDEPENDENT_AMBULATORY_CARE_PROVIDER_SITE_OTHER): Payer: Medicare PPO

## 2013-02-25 DIAGNOSIS — E559 Vitamin D deficiency, unspecified: Secondary | ICD-10-CM

## 2013-02-25 DIAGNOSIS — E119 Type 2 diabetes mellitus without complications: Secondary | ICD-10-CM

## 2013-02-25 LAB — LIPID PANEL
Cholesterol: 207 mg/dL — ABNORMAL HIGH (ref 0–200)
HDL: 42.6 mg/dL (ref >39.00)
Total CHOL/HDL Ratio: 5
Triglycerides: 96 mg/dL (ref 0.0–149.0)
VLDL: 19.2 mg/dL (ref 0.0–40.0)

## 2013-02-25 LAB — LDL CHOLESTEROL, DIRECT: Direct LDL: 149.9 mg/dL

## 2013-02-25 LAB — HEMOGLOBIN A1C: Hgb A1c MFr Bld: 6.1 % (ref 4.6–6.5)

## 2013-02-26 ENCOUNTER — Encounter: Payer: Self-pay | Admitting: Internal Medicine

## 2013-02-26 DIAGNOSIS — E785 Hyperlipidemia, unspecified: Secondary | ICD-10-CM | POA: Insufficient documentation

## 2013-02-26 DIAGNOSIS — E1169 Type 2 diabetes mellitus with other specified complication: Secondary | ICD-10-CM | POA: Insufficient documentation

## 2013-02-26 LAB — VITAMIN D 25 HYDROXY (VIT D DEFICIENCY, FRACTURES): Vit D, 25-Hydroxy: 33 ng/mL (ref 30–89)

## 2013-02-26 MED ORDER — ATORVASTATIN CALCIUM 10 MG PO TABS: 10.0000 mg | ORAL_TABLET | Freq: Every day | ORAL | Status: DC

## 2013-02-26 NOTE — Addendum Note (Signed)
Addended by: Rene Paci A on: 02/26/2013 01:13 PM   Modules accepted: Orders

## 2013-03-06 ENCOUNTER — Encounter (HOSPITAL_COMMUNITY): Payer: Self-pay | Admitting: *Deleted

## 2013-03-06 ENCOUNTER — Emergency Department (HOSPITAL_COMMUNITY)
Admission: EM | Admit: 2013-03-06 | Discharge: 2013-03-06 | Disposition: A | Discharge status: Home / Self Care | Payer: Medicare PPO | Attending: Emergency Medicine | Admitting: Emergency Medicine

## 2013-03-06 ENCOUNTER — Emergency Department (HOSPITAL_COMMUNITY): Payer: Medicare PPO

## 2013-03-06 DIAGNOSIS — Z9089 Acquired absence of other organs: Secondary | ICD-10-CM | POA: Insufficient documentation

## 2013-03-06 DIAGNOSIS — Z87442 Personal history of urinary calculi: Secondary | ICD-10-CM | POA: Insufficient documentation

## 2013-03-06 DIAGNOSIS — Z8719 Personal history of other diseases of the digestive system: Secondary | ICD-10-CM | POA: Insufficient documentation

## 2013-03-06 DIAGNOSIS — Z79899 Other long term (current) drug therapy: Secondary | ICD-10-CM | POA: Insufficient documentation

## 2013-03-06 DIAGNOSIS — R0789 Other chest pain: Secondary | ICD-10-CM | POA: Insufficient documentation

## 2013-03-06 DIAGNOSIS — J45901 Unspecified asthma with (acute) exacerbation: Secondary | ICD-10-CM | POA: Insufficient documentation

## 2013-03-06 DIAGNOSIS — E785 Hyperlipidemia, unspecified: Secondary | ICD-10-CM | POA: Insufficient documentation

## 2013-03-06 DIAGNOSIS — M129 Arthropathy, unspecified: Secondary | ICD-10-CM | POA: Insufficient documentation

## 2013-03-06 DIAGNOSIS — Z8742 Personal history of other diseases of the female genital tract: Secondary | ICD-10-CM | POA: Insufficient documentation

## 2013-03-06 DIAGNOSIS — E119 Type 2 diabetes mellitus without complications: Secondary | ICD-10-CM | POA: Insufficient documentation

## 2013-03-06 LAB — CBC WITH DIFFERENTIAL/PLATELET
Basophils Absolute: 0.1 10*3/uL (ref 0.0–0.1)
Basophils Relative: 1 % (ref 0–1)
Eosinophils Absolute: 0.1 10*3/uL (ref 0.0–0.7)
Eosinophils Relative: 2 % (ref 0–5)
HCT: 42.9 % (ref 36.0–46.0)
Hemoglobin: 14.4 g/dL (ref 12.0–15.0)
Lymphocytes Relative: 37 % (ref 12–46)
Lymphs Abs: 2.2 10*3/uL (ref 0.7–4.0)
MCH: 29.9 pg (ref 26.0–34.0)
MCHC: 33.6 g/dL (ref 30.0–36.0)
MCV: 89 fL (ref 78.0–100.0)
Monocytes Absolute: 0.4 10*3/uL (ref 0.1–1.0)
Monocytes Relative: 7 % (ref 3–12)
Neutro Abs: 3.2 10*3/uL (ref 1.7–7.7)
Neutrophils Relative %: 53 % (ref 43–77)
Platelets: 260 10*3/uL (ref 150–400)
RBC: 4.82 MIL/uL (ref 3.87–5.11)
RDW: 12.9 % (ref 11.5–15.5)
WBC: 6 10*3/uL (ref 4.0–10.5)

## 2013-03-06 LAB — COMPREHENSIVE METABOLIC PANEL
ALT: 12 U/L (ref 0–35)
AST: 15 U/L (ref 0–37)
Albumin: 3.8 g/dL (ref 3.5–5.2)
Alkaline Phosphatase: 61 U/L (ref 39–117)
BUN: 17 mg/dL (ref 6–23)
CO2: 23 mEq/L (ref 19–32)
Calcium: 9.4 mg/dL (ref 8.4–10.5)
Chloride: 105 mEq/L (ref 96–112)
Creatinine, Ser: 0.8 mg/dL (ref 0.50–1.10)
GFR calc Af Amer: 82 mL/min — ABNORMAL LOW (ref >90)
GFR calc non Af Amer: 71 mL/min — ABNORMAL LOW (ref >90)
Glucose, Bld: 98 mg/dL (ref 70–99)
Potassium: 4.2 mEq/L (ref 3.5–5.1)
Sodium: 138 mEq/L (ref 135–145)
Total Bilirubin: 0.3 mg/dL (ref 0.3–1.2)
Total Protein: 6.8 g/dL (ref 6.0–8.3)

## 2013-03-06 LAB — URINE MICROSCOPIC-ADD ON

## 2013-03-06 LAB — D-DIMER, QUANTITATIVE: D-Dimer, Quant: <0.27 ug/mL-FEU (ref 0.00–0.48)

## 2013-03-06 LAB — URINALYSIS, ROUTINE W REFLEX MICROSCOPIC
Bilirubin Urine: NEGATIVE
Glucose, UA: NEGATIVE mg/dL
Hgb urine dipstick: NEGATIVE
Ketones, ur: NEGATIVE mg/dL
Nitrite: NEGATIVE
Protein, ur: NEGATIVE mg/dL
Specific Gravity, Urine: 1.014 (ref 1.005–1.030)
Urobilinogen, UA: 0.2 mg/dL (ref 0.0–1.0)
pH: 6.5 (ref 5.0–8.0)

## 2013-03-06 LAB — TROPONIN I
Troponin I: <0.3 ng/mL (ref <0.30)
Troponin I: <0.3 ng/mL (ref <0.30)

## 2013-03-06 MED ORDER — OMEPRAZOLE 20 MG PO CPDR: 20.0000 mg | DELAYED_RELEASE_CAPSULE | Freq: Every day | ORAL | Status: DC

## 2013-03-06 MED ORDER — GI COCKTAIL ~~LOC~~
30.0000 mL | Freq: Once | ORAL | Status: AC
  Administered 2013-03-06: 30 mL via ORAL
  Filled 2013-03-06: qty 30

## 2013-03-06 NOTE — ED Provider Notes (Signed)
CSN: [id number]     Arrival date & time 03/06/13  1140 History   First MD Initiated Contact with Patient 03/06/13 1155     Chief Complaint  Patient presents with  . Chest Pain   (Consider location/radiation/quality/duration/timing/severity/associated sxs/prior Treatment) HPI Comments: Patient presents with episode of sharp chest pain that she woke up with this morning. He was in the center of her chest and lasted about 30 seconds before improving. She's had a constant dull ache in the left side of her chest since then. This pain is been going on for the past 3 hours. It is worse with bending over and better with sitting up. She had a couple more episodes of the sharp pain lasting less than a minute with bending over earlier today. Denies any shortness of breath, nausea, diaphoresis, chills, fever or cough. She denies any cardiac history. She reports having a negative stress test many years ago. She denies any leg pain or swelling.  Pain is not exertional.  Patient was able to go dancing last night without pain.  The history is provided by the patient and the spouse.    Past Medical History  Diagnosis Date  . Diverticulosis   . Breast nodule   . Allergic rhinitis   . Arthritis   . Asthma   . Diet-controlled type 2 diabetes mellitus 09/2011 dx  . Recurrent kidney stones   . Dyslipidemia    Past Surgical History  Procedure Laterality Date  . Tonsillectomy and adenoidectomy    . Dilation and curettage of uterus     Family History  Problem Relation Age of Onset  . Heart disease Father   . Hyperlipidemia Father   . Hypertension Father   . Colon cancer Mother   . Arthritis Mother   . Cancer Mother     colon cancer  . Diabetes Brother   . Diabetes Paternal Aunt    History  Substance Use Topics  . Smoking status: Never Smoker   . 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[place] but in [place] since 1988  .  Alcohol Use: 1.8 oz/week    3 Glasses of wine per week   OB History   Grav Para Term Preterm Abortions TAB SAB Ect Mult Living                 Review of Systems  Constitutional: Negative for fever, activity change and appetite change.  HENT: Negative for congestion, sore throat and rhinorrhea.   Respiratory: Positive for chest tightness and shortness of breath. Negative for cough.   Cardiovascular: Positive for chest pain.  Gastrointestinal: Negative for nausea and abdominal pain.  Genitourinary: Negative for dysuria, hematuria, vaginal bleeding and vaginal discharge.  Musculoskeletal: Negative for back pain.  Skin: Negative for rash.  Neurological: Negative for dizziness, weakness and headaches.  A complete 10 system review of systems was obtained and all systems are negative except as noted in the HPI and PMH.    Allergies  Scallops  Home Medications   Current Outpatient Rx  Name  Route  Sig  Dispense  Refill  . aspirin 81 MG tablet   Oral   Take 81 mg by mouth daily as needed for pain.         . cyanocobalamin (,VITAMIN B-12,) 1000 MCG/ML injection      INJECT 1 ML (1,000 MCG TOTAL) INTO THE MUSCLE EVERY 30 (THIRTY) DAYS.  30 mL   0     PT WOULD LIKE REFILS FOR A 3 MONTHS SUPPLY. THANK  ...   . atorvastatin (LIPITOR) 10 MG tablet   Oral   Take 1 tablet (10 mg total) by mouth daily.   90 tablet   3   . omeprazole (PRILOSEC) 20 MG capsule   Oral   Take 1 capsule (20 mg total) by mouth daily.   30 capsule   0    BP 139/78  Pulse 70  Temp(Src) 98.3 F (36.8 C) (Oral)  Resp 15  SpO2 98% Physical Exam  Constitutional: She is oriented to person, place, and time. She appears well-developed and well-nourished. No distress.  HENT:  Head: Normocephalic and atraumatic.  Mouth/Throat: Oropharynx is clear and moist. No oropharyngeal exudate.  Eyes: Conjunctivae and EOM are normal. Pupils are equal, round, and reactive to light.  Neck: Normal range of motion.  Neck supple.  Cardiovascular: Normal rate, regular rhythm and normal heart sounds.   No murmur heard. Pulmonary/Chest: Effort normal and breath sounds normal. No respiratory distress. She exhibits no tenderness.  Chest pain is not reproducible  Abdominal: Soft. There is no tenderness. There is no rebound and no guarding.  Musculoskeletal: Normal range of motion. She exhibits no edema and no tenderness.  Neurological: She is alert and oriented to person, place, and time. No cranial nerve deficit. She exhibits normal muscle tone. Coordination normal.  Skin: Skin is warm.    ED Course  Procedures (including critical care time) Labs Review Labs Reviewed  COMPREHENSIVE METABOLIC PANEL - Abnormal; Notable for the following:    GFR calc non Af Amer 71 (*)    GFR calc Af Amer 82 (*)    All other components within normal limits  URINALYSIS, ROUTINE W REFLEX MICROSCOPIC - Abnormal; Notable for the following:    Leukocytes, UA TRACE (*)    All other components within normal limits  CBC WITH DIFFERENTIAL  TROPONIN I  D-DIMER, QUANTITATIVE  TROPONIN I  URINE MICROSCOPIC-ADD ON   Imaging Review Dg Chest 2 View  03/06/2013   CLINICAL DATA:  Chest pain  EXAM: CHEST  2 VIEW  COMPARISON:  05/27/2009  FINDINGS: The heart size and mediastinal contours are within normal limits. Both lungs are clear. The visualized skeletal structures are unremarkable.  IMPRESSION: No active cardiopulmonary disease.   Electronically Signed   By: Natasha Mead   On: 03/06/2013 12:28    MDM   1. Atypical chest pain    Chest discomfort since this morning it has been constant for the past 3 hours. Intermittent episodes of sharp stabbing lasting a few seconds at a time.. EKG is normal sinus rhythm.  Pain is atypical for ACS or PE.  Patient reports improvement after GI cocktail. D-dimer and troponin negative. EKG nonischemic. Her chest pain is still reproduced by bending over at the waist to suggest GI or pericardial  etiology. He has no chest exertion. She was able to dancing yesterday without problem.  D-dimer negative. Delta troponin negative. History is not consistent with ACS or PE. Will start PPI.  Followup with PCP for stress test.   Date: 03/06/2013  Rate: 67  Rhythm: normal sinus rhythm  QRS Axis: normal  Intervals: normal  ST/T Wave abnormalities: normal  Conduction Disutrbances:none  Narrative Interpretation:   Old EKG Reviewed: unchanged    Glynn Octave, MD 03/06/13 [phone]

## 2013-03-06 NOTE — ED Notes (Addendum)
Pt reports that for the past 3 days, she woke up with sharp left sided chest pain. States when this happened this morning, it subsided - bent over to pet her cat, sharp pain came back. Reports 3 episodes of sharp pain, now states pain is dull. States left arm is "burning" Pt reports hx of indigestion, thought this pain may be related

## 2013-03-08 ENCOUNTER — Other Ambulatory Visit: Payer: Self-pay | Admitting: Internal Medicine

## 2013-03-09 ENCOUNTER — Encounter: Payer: Self-pay | Admitting: Internal Medicine

## 2013-03-09 ENCOUNTER — Other Ambulatory Visit: Payer: Self-pay | Admitting: *Deleted

## 2013-03-09 MED ORDER — CYANOCOBALAMIN 1000 MCG/ML IJ SOLN: INTRAMUSCULAR | Status: DC

## 2013-03-09 NOTE — Telephone Encounter (Signed)
Sent email wanting refill on her B12 sent to [facility]...lmb

## 2013-03-10 ENCOUNTER — Ambulatory Visit (HOSPITAL_COMMUNITY): Payer: Medicare PPO | Attending: Cardiology | Admitting: Radiology

## 2013-03-10 DIAGNOSIS — I951 Orthostatic hypotension: Secondary | ICD-10-CM

## 2013-03-10 DIAGNOSIS — E785 Hyperlipidemia, unspecified: Secondary | ICD-10-CM | POA: Insufficient documentation

## 2013-03-10 DIAGNOSIS — I959 Hypotension, unspecified: Secondary | ICD-10-CM

## 2013-03-10 DIAGNOSIS — E119 Type 2 diabetes mellitus without complications: Secondary | ICD-10-CM | POA: Insufficient documentation

## 2013-03-10 NOTE — Progress Notes (Signed)
Echocardiogram performed.  

## 2013-03-15 ENCOUNTER — Encounter: Payer: Self-pay | Admitting: Internal Medicine

## 2013-05-24 LAB — HM DIABETES EYE EXAM

## 2013-05-31 ENCOUNTER — Ambulatory Visit (INDEPENDENT_AMBULATORY_CARE_PROVIDER_SITE_OTHER): Payer: Medicare PPO | Admitting: Internal Medicine

## 2013-05-31 ENCOUNTER — Encounter: Payer: Self-pay | Admitting: Internal Medicine

## 2013-05-31 VITALS — BP 116/63 | HR 63 | Temp 96.8°F | Resp 16 | Wt 136.0 lb

## 2013-05-31 DIAGNOSIS — E119 Type 2 diabetes mellitus without complications: Secondary | ICD-10-CM

## 2013-05-31 DIAGNOSIS — Z23 Encounter for immunization: Secondary | ICD-10-CM

## 2013-05-31 DIAGNOSIS — Z0000 Encounter for general adult medical examination without abnormal findings: Secondary | ICD-10-CM

## 2013-05-31 MED ORDER — ATORVASTATIN CALCIUM 10 MG PO TABS: ORAL_TABLET | ORAL | Status: DC

## 2013-05-31 NOTE — Progress Notes (Signed)
Subjective:    Patient ID: Erica [name], female    DOB: 1941/03/06, 72 y.o.   MRN: [id number]  HPI Here for follow up - reviewed chronic medical issues, interval medical events and current concerns  Also  Here for medicare wellness  Diet: heart healthy or DM if diabetic Physical activity: sedentary Depression/mood screen: negative Hearing: intact to whispered voice Visual acuity: grossly normal, performs annual eye exam  ADLs: capable Fall risk: none Home safety: good Cognitive evaluation: intact to orientation, naming, recall and repetition EOL planning: adv directives, full code/ I agree  I have personally reviewed and have noted 1. The patient's medical and social history 2. Their use of alcohol, tobacco or illicit drugs 3. Their current medications and supplements 4. The patient's functional ability including ADL's, fall risks, home safety risks and hearing or visual impairment. 5. Diet and physical activities 6. Evidence for depression or mood disorders    Past Medical History  Diagnosis Date  . Diverticulosis   . Breast nodule   . Allergic rhinitis   . Arthritis   . Asthma   . Diet-controlled type 2 diabetes mellitus 09/2011 dx  . Recurrent kidney stones   . Dyslipidemia    Family History  Problem Relation Age of Onset  . Heart disease Father   . Hyperlipidemia Father   . Hypertension Father   . Colon cancer Mother   . Arthritis Mother   . Cancer Mother     colon cancer  . Diabetes Brother   . Diabetes Paternal Aunt    History  Substance Use Topics  . Smoking status: Never Smoker   . 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[place] but in [place] since 1988  . Alcohol Use: 1.8 oz/week    3 Glasses of wine per week    Review of Systems  Constitutional: Negative for fever, fatigue and unexpected weight change.  Respiratory: Negative for cough and shortness of breath.    Cardiovascular: Negative for chest pain, palpitations and leg swelling.  Neurological: Negative for syncope and weakness.  All other systems reviewed and are negative.        Objective:   Physical Exam BP 116/63  Pulse 63  Temp(Src) 96.8 F (36 C) (Oral)  Resp 16  Wt 136 lb (61.689 kg)  SpO2 97% Wt Readings from Last 3 Encounters:  05/31/13 136 lb (61.689 kg)  02/24/13 138 lb 6.4 oz (62.778 kg)  10/28/12 140 lb (63.504 kg)   Constitutional: She appears well-developed and well-nourished. No distress.   Neck: Normal range of motion. Neck supple. No JVD present. No thyromegaly present.  Cardiovascular: Normal rate, regular rhythm and normal heart sounds.  No murmur heard. No BLE edema. Pulmonary/Chest: Effort normal and breath sounds normal. No respiratory distress. She has no wheezes.   Lab Results  Component Value Date   WBC 6.0 03/06/2013   HGB 14.4 03/06/2013   HCT 42.9 03/06/2013   PLT 260 03/06/2013   GLUCOSE 98 03/06/2013   CHOL 207* 02/25/2013   TRIG 96.0 02/25/2013   HDL 42.60 02/25/2013   LDLDIRECT 149.9 02/25/2013   LDLCALC 124 02/17/2013   ALT 12 03/06/2013   AST 15 03/06/2013   NA 138 03/06/2013   K 4.2 03/06/2013   CL 105 03/06/2013   CREATININE 0.80 03/06/2013   BUN 17 03/06/2013   CO2 23 03/06/2013   TSH 1.35 08/18/2012   INR 1.0 01/24/2009  HGBA1C 6.1 02/25/2013   MICROALBUR 1.1 09/28/2012       Assessment & Plan:   AWV/v70.0 - Today patient counseled on age appropriate routine health concerns for screening and prevention, each reviewed and up to date or declined. Immunizations reviewed and up to date or declined. Labs reviewed. Risk factors for depression reviewed and negative. Hearing function and visual acuity are intact. ADLs screened and addressed as needed. Functional ability and level of safety reviewed and appropriate. Education, counseling and referrals performed based on assessed risks today. Patient provided with a copy of personalized plan for preventive  services.  Also see problem list. Medications and labs reviewed today.

## 2013-05-31 NOTE — Patient Instructions (Addendum)
It was good to see you today.  Health Maintenance reviewed - prevnar vaccine given today -all other recommended immunizations and age-appropriate screenings are up-to-date.  We have reviewed your prior records including labs and tests today  Medications reviewed and updated, no changes recommended at this time.  Test(s) ordered today. Your results will be released to MyChart (or called to you) after review, usually within 72hours after test completion. If any changes need to be made, you will be notified at that same time.  Please schedule followup in 6 months, call sooner if problems.    Health Maintenance, Female A healthy lifestyle and preventative care can promote health and wellness.  Maintain regular health, dental, and eye exams.  Eat a healthy diet. Foods like vegetables, fruits, whole grains, low-fat dairy products, and lean protein foods contain the nutrients you need without too many calories. Decrease your intake of foods high in solid fats, added sugars, and salt. Get information about a proper diet from your caregiver, if necessary.  Regular physical exercise is one of the most important things you can do for your health. Most adults should get at least 150 minutes of moderate-intensity exercise (any activity that increases your heart rate and causes you to sweat) each week. In addition, most adults need muscle-strengthening exercises on 2 or more days a week.   Maintain a healthy weight. The body mass index (BMI) is a screening tool to identify possible weight problems. It provides an estimate of body fat based on height and weight. Your caregiver can help determine your BMI, and can help you achieve or maintain a healthy weight. For adults 20 years and older:  A BMI below 18.5 is considered underweight.  A BMI of 18.5 to 24.9 is normal.  A BMI of 25 to 29.9 is considered overweight.  A BMI of 30 and above is considered obese.  Maintain normal blood lipids and  cholesterol by exercising and minimizing your intake of saturated fat. Eat a balanced diet with plenty of fruits and vegetables. Blood tests for lipids and cholesterol should begin at age 98 and be repeated every 5 years. If your lipid or cholesterol levels are high, you are over 50, or you are a high risk for heart disease, you may need your cholesterol levels checked more frequently.Ongoing high lipid and cholesterol levels should be treated with medicines if diet and exercise are not effective.  If you smoke, find out from your caregiver how to quit. If you do not use tobacco, do not start.  Lung cancer screening is recommended for adults aged 57 80 years who are at high risk for developing lung cancer because of a history of smoking. Yearly low-dose computed tomography (CT) is recommended for people who have at least a 30-pack-year history of smoking and are a current smoker or have quit within the past 15 years. A pack year of smoking is smoking an average of 1 pack of cigarettes a day for 1 year (for example: 1 pack a day for 30 years or 2 packs a day for 15 years). Yearly screening should continue until the smoker has stopped smoking for at least 15 years. Yearly screening should also be stopped for people who develop a health problem that would prevent them from having lung cancer treatment.  If you are pregnant, do not drink alcohol. If you are breastfeeding, be very cautious about drinking alcohol. If you are not pregnant and choose to drink alcohol, do not exceed 1 drink per day.  One drink is considered to be 12 ounces (355 mL) of beer, 5 ounces (148 mL) of wine, or 1.5 ounces (44 mL) of liquor.  Avoid use of street drugs. Do not share needles with anyone. Ask for help if you need support or instructions about stopping the use of drugs.  High blood pressure causes heart disease and increases the risk of stroke. Blood pressure should be checked at least every 1 to 2 years. Ongoing high blood  pressure should be treated with medicines, if weight loss and exercise are not effective.  If you are 46 to 74 years old, ask your caregiver if you should take aspirin to prevent strokes.  Diabetes screening involves taking a blood sample to check your fasting blood sugar level. This should be done once every 3 years, after age 39, if you are within normal weight and without risk factors for diabetes. Testing should be considered at a younger age or be carried out more frequently if you are overweight and have at least 1 risk factor for diabetes.  Breast cancer screening is essential preventative care for women. You should practice "breast self-awareness." This means understanding the normal appearance and feel of your breasts and may include breast self-examination. Any changes detected, no matter how small, should be reported to a caregiver. Women in their 12s and 30s should have a clinical breast exam (CBE) by a caregiver as part of a regular health exam every 1 to 3 years. After age 6, women should have a CBE every year. Starting at age 38, women should consider having a mammogram (breast X-ray) every year. Women who have a family history of breast cancer should talk to their caregiver about genetic screening. Women at a high risk of breast cancer should talk to their caregiver about having an MRI and a mammogram every year.  Breast cancer gene (BRCA)-related cancer risk assessment is recommended for women who have family members with BRCA-related cancers. BRCA-related cancers include breast, ovarian, tubal, and peritoneal cancers. Having family members with these cancers may be associated with an increased risk for harmful changes (mutations) in the breast cancer genes BRCA1 and BRCA2. Results of the assessment will determine the need for genetic counseling and BRCA1 and BRCA2 testing.  The Pap test is a screening test for cervical cancer. Women should have a Pap test starting at age 3. Between ages  32 and 41, Pap tests should be repeated every 2 years. Beginning at age 43, you should have a Pap test every 3 years as long as the past 3 Pap tests have been normal. If you had a hysterectomy for a problem that was not cancer or a condition that could lead to cancer, then you no longer need Pap tests. If you are between ages 24 and 55, and you have had normal Pap tests going back 10 years, you no longer need Pap tests. If you have had past treatment for cervical cancer or a condition that could lead to cancer, you need Pap tests and screening for cancer for at least 20 years after your treatment. If Pap tests have been discontinued, risk factors (such as a new sexual partner) need to be reassessed to determine if screening should be resumed. Some women have medical problems that increase the chance of getting cervical cancer. In these cases, your caregiver may recommend more frequent screening and Pap tests.  The human papillomavirus (HPV) test is an additional test that may be used for cervical cancer screening. The HPV test  looks for the virus that can cause the cell changes on the cervix. The cells collected during the Pap test can be tested for HPV. The HPV test could be used to screen women aged 45 years and older, and should be used in women of any age who have unclear Pap test results. After the age of 82, women should have HPV testing at the same frequency as a Pap test.  Colorectal cancer can be detected and often prevented. Most routine colorectal cancer screening begins at the age of 39 and continues through age 1. However, your caregiver may recommend screening at an earlier age if you have risk factors for colon cancer. On a yearly basis, your caregiver may provide home test kits to check for hidden blood in the stool. Use of a small camera at the end of a tube, to directly examine the colon (sigmoidoscopy or colonoscopy), can detect the earliest forms of colorectal cancer. Talk to your caregiver  about this at age 50, when routine screening begins. Direct examination of the colon should be repeated every 5 to 10 years through age 26, unless early forms of pre-cancerous polyps or small growths are found.  Hepatitis C blood testing is recommended for all people born from [birth year] through 1967 and any individual with known risks for hepatitis C.  Practice safe sex. Use condoms and avoid high-risk sexual practices to reduce the spread of sexually transmitted infections (STIs). Sexually active women aged 31 and younger should be checked for Chlamydia, which is a common sexually transmitted infection. Older women with new or multiple partners should also be tested for Chlamydia. Testing for other STIs is recommended if you are sexually active and at increased risk.  Osteoporosis is a disease in which the bones lose minerals and strength with aging. This can result in serious bone fractures. The risk of osteoporosis can be identified using a bone density scan. Women ages 64 and over and women at risk for fractures or osteoporosis should discuss screening with their caregivers. Ask your caregiver whether you should be taking a calcium supplement or vitamin D to reduce the rate of osteoporosis.  Menopause can be associated with physical symptoms and risks. Hormone replacement therapy is available to decrease symptoms and risks. You should talk to your caregiver about whether hormone replacement therapy is right for you.  Use sunscreen. Apply sunscreen liberally and repeatedly throughout the day. You should seek shade when your shadow is shorter than you. Protect yourself by wearing long sleeves, pants, a wide-brimmed hat, and sunglasses year round, whenever you are outdoors.  Notify your caregiver of new moles or changes in moles, especially if there is a change in shape or color. Also notify your caregiver if a mole is larger than the size of a pencil eraser.  Stay current with your  immunizations. Document Released: 12/24/2010 Document Revised: 10/05/2012 Document Reviewed: 12/24/2010 [facility] Patient Information 2014 [place], [place]. Diets for Diabetes, Food Labeling Look at food labels to help you decide how much of a product you can eat. You will want to check the amount of total carbohydrate in a serving to see how the food fits into your meal plan. In the list of ingredients, the ingredient present in the largest amount by weight must be listed first, followed by the other ingredients in descending order. STANDARD OF IDENTITY Most products have a list of ingredients. However, foods that the Food and Drug Administration (FDA) has given a standard of identity do not need a list  of ingredients. A standard of identity means that a food must contain certain ingredients if it is called a particular name. Examples are mayonnaise, peanut butter, ketchup, jelly, and cheese. LABELING TERMS There are many terms found on food labels. Some of these terms have specific definitions. Some terms are regulated by the FDA, and the FDA has clearly specified how they can be used. Others are not regulated or well-defined and can be misleading and confusing. SPECIFICALLY DEFINED TERMS Nutritive Sweetener.  A sweetener that contains calories,such as table sugar or honey. Nonnutritive Sweetener.  A sweetener with few or no calories,such as saccharin, aspartame, sucralose, and cyclamate. LABELING TERMS REGULATED BY THE FDA Free.  The product contains only a tiny or small amount of fat, cholesterol, sodium, sugar, or calories. For example, a "fat-free" product will contain less than 0.5 g of fat per serving. Low.  A food described as "low" in fat, saturated fat, cholesterol, sodium, or calories could be eaten fairly often without exceeding dietary guidelines. For example, "low in fat" means no more than 3 g of fat per serving. Lean.  "Lean" and "extra lean" are U.S. Department of Agriculture  Architect) terms for use on meat and poultry products. "Lean" means the product contains less than 10 g of fat, 4 g of saturated fat, and 95 mg of cholesterol per serving. "Lean" is not as low in fat as a product labeled "low." Extra Lean.  "Extra lean" means the product contains less than 5 g of fat, 2 g of saturated fat, and 95 mg of cholesterol per serving. While "extra lean" has less fat than "lean," it is still higher in fat than a product labeled "low." Reduced, Less, Fewer.  A diet product that contains 25% less of a nutrient or calories than the regular version. For example, hot dogs might be labeled "25% less fat than our regular hot dogs." Light/Lite.  A diet product that contains  fewer calories or  the fat of the original. For example, "light in sodium" means a product with  the usual sodium. More.  One serving contains at least 10% more of the daily value of a vitamin, mineral, or fiber than usual. Good Source Of.  One serving contains 10% to 19% of the daily value for a particular vitamin, mineral, or fiber. Excellent Source Of.  One serving contains 20% or more of the daily value for a particular nutrient. Other terms used might be "high in" or "rich in." Enriched or Fortified.  The product contains added vitamins, minerals, or protein. Nutrition labeling must be used on enriched or fortified foods. Imitation.  The product has been altered so that it is lower in protein, vitamins, or minerals than the usual food,such as imitation peanut butter. Total Fat.  The number listed is the total of all fat found in a serving of the product. Under total fat, food labels must list saturated fat and trans fat, which are associated with raising bad cholesterol and an increased risk of heart blood vessel disease. Saturated Fat.  Mainly fats from animal-based sources. Some examples are red meat, cheese, cream, whole milk, and coconut oil. Trans Fat.  Found in some fried snack foods,  packaged foods, and fried restaurant foods. It is recommended you eat as close to 0 g of trans fat as possible, since it raises bad cholesterol and lowers good cholesterol. Polyunsaturated and Monounsaturated Fats.  More healthful fats. These fats are from plant sources. Total Carbohydrate.  The number of carbohydrate grams  in a serving of the product. Under total carbohydrate are listed the other carbohydrate sources, such as dietary fiber and sugars. Dietary Fiber.  A carbohydrate from plant sources. Sugars.  Sugars listed on the label contain all naturally occurring sugars as well as added sugars. LABELING TERMS NOT REGULATED BY THE FDA Sugarless.  Table sugar (sucrose) has not been added. However, the manufacturer may use another form of sugar in place of sucrose to sweeten the product. For example, sugar alcohols are used to sweeten foods. Sugar alcohols are a form of sugar but are not table sugar. If a product contains sugar alcohols in place of sucrose, it can still be labeled "sugarless." Low Salt, Salt-Free, Unsalted, No Salt, No Salt Added, Without Added Salt.  Food that is usually processed with salt has been made without salt. However, the food may contain sodium-containing additives, such as preservatives, leavening agents, or flavorings. Natural.  This term has no legal meaning. Organic.  Foods that are certified as organic have been inspected and approved by the USDA to ensure they are produced without pesticides, fertilizers containing synthetic ingredients, bioengineering, or ionizing radiation. Document Released: 06/13/2003 Document Revised: 09/02/2011 Document Reviewed: 12/29/2008 [facility] Patient Information 2014 [place], [place].

## 2013-05-31 NOTE — Assessment & Plan Note (Signed)
Lab Results  Component Value Date   HGBA1C 6.1 02/25/2013   Diet controlled diabetes by A1c, dx 09/2011 improved from 6.23 September 2011 with weight loss and improved diet/exercise Lipid goal LDL <100 - recheck now The current medical regimen is effective;  continue present plan and medications.

## 2013-05-31 NOTE — Progress Notes (Signed)
Pre-visit discussion using our clinic review tool. No additional management support is needed unless otherwise documented below in the visit note.  

## 2013-06-01 ENCOUNTER — Other Ambulatory Visit (INDEPENDENT_AMBULATORY_CARE_PROVIDER_SITE_OTHER): Payer: Medicare PPO

## 2013-06-01 DIAGNOSIS — E119 Type 2 diabetes mellitus without complications: Secondary | ICD-10-CM

## 2013-06-01 LAB — LIPID PANEL
Cholesterol: 191 mg/dL (ref 0–200)
HDL: 39 mg/dL — ABNORMAL LOW (ref >39.00)
LDL Cholesterol: 134 mg/dL — ABNORMAL HIGH (ref 0–99)
Total CHOL/HDL Ratio: 5
Triglycerides: 91 mg/dL (ref 0.0–149.0)
VLDL: 18.2 mg/dL (ref 0.0–40.0)

## 2013-06-01 LAB — HEMOGLOBIN A1C: Hgb A1c MFr Bld: 6 % (ref 4.6–6.5)

## 2013-06-02 ENCOUNTER — Encounter: Payer: Self-pay | Admitting: Internal Medicine

## 2013-06-29 ENCOUNTER — Encounter: Payer: Self-pay | Admitting: Internal Medicine

## 2013-11-29 ENCOUNTER — Ambulatory Visit: Payer: Medicare PPO | Admitting: Internal Medicine

## 2013-12-06 ENCOUNTER — Other Ambulatory Visit (INDEPENDENT_AMBULATORY_CARE_PROVIDER_SITE_OTHER): Payer: Medicare PPO

## 2013-12-06 ENCOUNTER — Ambulatory Visit (INDEPENDENT_AMBULATORY_CARE_PROVIDER_SITE_OTHER): Payer: Medicare PPO | Admitting: Internal Medicine

## 2013-12-06 ENCOUNTER — Encounter: Payer: Self-pay | Admitting: Internal Medicine

## 2013-12-06 VITALS — BP 110/68 | HR 70 | Temp 98.5°F | Wt 140.8 lb

## 2013-12-06 DIAGNOSIS — M25562 Pain in left knee: Secondary | ICD-10-CM | POA: Insufficient documentation

## 2013-12-06 DIAGNOSIS — M25569 Pain in unspecified knee: Secondary | ICD-10-CM

## 2013-12-06 DIAGNOSIS — E119 Type 2 diabetes mellitus without complications: Secondary | ICD-10-CM

## 2013-12-06 DIAGNOSIS — M545 Low back pain, unspecified: Secondary | ICD-10-CM

## 2013-12-06 LAB — URINALYSIS, ROUTINE W REFLEX MICROSCOPIC
Bilirubin Urine: NEGATIVE
Hgb urine dipstick: NEGATIVE
Ketones, ur: NEGATIVE
Leukocytes, UA: NEGATIVE
Nitrite: NEGATIVE
Specific Gravity, Urine: 1.015 (ref 1.000–1.030)
Total Protein, Urine: NEGATIVE
Urine Glucose: NEGATIVE
Urobilinogen, UA: 0.2 (ref 0.0–1.0)
pH: 6.5 (ref 5.0–8.0)

## 2013-12-06 LAB — HEMOGLOBIN A1C: Hgb A1c MFr Bld: 6.1 % (ref 4.6–6.5)

## 2013-12-06 MED ORDER — NAPROXEN SODIUM 220 MG PO TABS: 220.0000 mg | ORAL_TABLET | Freq: Two times a day (BID) | ORAL | Status: DC

## 2013-12-06 NOTE — Progress Notes (Signed)
Subjective:    Patient ID: Erica [name], female    DOB: Nov 20, 1940, 73 y.o.   MRN: [id number]  HPI  Patient is here for follow up  Reviewed chronic medical issues and interval medical events  Past Medical History  Diagnosis Date  . Diverticulosis   . Breast nodule   . Allergic rhinitis   . Arthritis   . Asthma   . Diet-controlled type 2 diabetes mellitus 09/2011 dx  . Recurrent kidney stones   . Dyslipidemia     Review of Systems  Constitutional: Negative for fever and fatigue.  Respiratory: Negative for cough and shortness of breath.   Cardiovascular: Negative for chest pain and leg swelling.  Musculoskeletal: Positive for arthralgias (L knee x 7 days since dog ran into knee) and back pain (1 mo ago - across low back x 4 nights, then resolved spont - no radiation, unlike prior kidney stones).       Objective:   Physical Exam  BP 110/68  Pulse 70  Temp(Src) 98.5 F (36.9 C) (Oral)  Wt 140 lb 12.8 oz (63.866 kg)  SpO2 96% Wt Readings from Last 3 Encounters:  12/06/13 140 lb 12.8 oz (63.866 kg)  05/31/13 136 lb (61.689 kg)  02/24/13 138 lb 6.4 oz (62.778 kg)   Constitutional: She appears well-developed and well-nourished. No distress.  Neck: Normal range of motion. Neck supple. No JVD present. No thyromegaly present.  Cardiovascular: Normal rate, regular rhythm and normal heart sounds.  No murmur heard. No BLE edema. Pulmonary/Chest: Effort normal and breath sounds normal. No respiratory distress. She has no wheezes.  MSkel: L knee - mild boggy synovitis - nontender to palpation over joint line; FROM and ligamentous function intact Back: full range of motion of thoracic and lumbar spine. Non tender to palpation. Negative straight leg raise. Sensation intact in all dermatomes of the lower extremities. Full strength to manual muscle testing. patient is able to heel toe walk without difficulty and ambulates with normal gait. Psychiatric: She has a normal mood and  affect. Her behavior is normal. Judgment and thought content normal.   Lab Results  Component Value Date   WBC 6.0 03/06/2013   HGB 14.4 03/06/2013   HCT 42.9 03/06/2013   PLT 260 03/06/2013   GLUCOSE 98 03/06/2013   CHOL 191 06/01/2013   TRIG 91.0 06/01/2013   HDL 39.00* 06/01/2013   LDLDIRECT 149.9 02/25/2013   LDLCALC 134* 06/01/2013   ALT 12 03/06/2013   AST 15 03/06/2013   NA 138 03/06/2013   K 4.2 03/06/2013   CL 105 03/06/2013   CREATININE 0.80 03/06/2013   BUN 17 03/06/2013   CO2 23 03/06/2013   TSH 1.35 08/18/2012   INR 1.0 01/24/2009   HGBA1C 6.0 06/01/2013   MICROALBUR 1.1 09/28/2012    Dg Chest 2 View  03/06/2013   CLINICAL DATA:  Chest pain  EXAM: CHEST  2 VIEW  COMPARISON:  05/27/2009  FINDINGS: The heart size and mediastinal contours are within normal limits. Both lungs are clear. The visualized skeletal structures are unremarkable.  IMPRESSION: No active cardiopulmonary disease.   Electronically Signed   By: Lahoma Crocker   On: 03/06/2013 12:28       Assessment & Plan:   Problem List Items Addressed This Visit   Diet-controlled type 2 diabetes mellitus - Primary      Lab Results  Component Value Date   HGBA1C 6.0 06/01/2013   Diet controlled diabetes by A1c, dx 09/2011 improved  from 6.23 September 2011 with weight loss and improved diet/exercise recheck now The current medical regimen is effective;  continue present plan and medications.     Relevant Orders      Hemoglobin A1c   Left knee pain     Mild chronic pain symptoms  exacerbated by lateral trauma when 100# dog impacted against knee 2 weeks No effusion on exam, ligamentous function intact Recommended RICE nd Aleve twice daily To followup with sports medicine if unimproved Tamala Julian)     Other Visit Diagnoses   Low back pain        Relevant Medications       naproxen sodium (ANAPROX) tablet    Other Relevant Orders       Urinalysis, Routine w reflex microscopic

## 2013-12-06 NOTE — Progress Notes (Signed)
Pre visit review using our clinic review tool, if applicable. No additional management support is needed unless otherwise documented below in the visit note. 

## 2013-12-06 NOTE — Assessment & Plan Note (Signed)
Lab Results  Component Value Date   HGBA1C 6.0 06/01/2013   Diet controlled diabetes by A1c, dx 09/2011 improved from 6.23 September 2011 with weight loss and improved diet/exercise recheck now The current medical regimen is effective;  continue present plan and medications.

## 2013-12-06 NOTE — Assessment & Plan Note (Signed)
Mild chronic pain symptoms  exacerbated by lateral trauma when 100# dog impacted against knee 2 weeks No effusion on exam, ligamentous function intact Recommended RICE nd Aleve twice daily To followup with sports medicine if unimproved Erica [name])

## 2013-12-06 NOTE — Patient Instructions (Signed)
It was good to see you today.  We have reviewed your prior records including labs and tests today  Test(s) ordered today. Your results will be released to [place] (or called to you) after review, usually within 72hours after test completion. If any changes need to be made, you will be notified at that same time.  Medications reviewed and updated Tried Aleve twice daily for 5 days, then as needed - no other changes recommended at this time.  Please make an appointment to see Dr. Gardenia Phlegm as discussed  Please schedule followup in 6 months for annual exam/labs, call sooner if problems.

## 2013-12-14 ENCOUNTER — Other Ambulatory Visit (INDEPENDENT_AMBULATORY_CARE_PROVIDER_SITE_OTHER): Payer: Medicare PPO

## 2013-12-14 ENCOUNTER — Encounter: Payer: Self-pay | Admitting: Family Medicine

## 2013-12-14 ENCOUNTER — Ambulatory Visit (INDEPENDENT_AMBULATORY_CARE_PROVIDER_SITE_OTHER): Payer: Medicare PPO | Admitting: Family Medicine

## 2013-12-14 VITALS — BP 112/62 | HR 79 | Ht 64.0 in | Wt 139.0 lb

## 2013-12-14 DIAGNOSIS — M25569 Pain in unspecified knee: Secondary | ICD-10-CM

## 2013-12-14 DIAGNOSIS — M7050 Other bursitis of knee, unspecified knee: Secondary | ICD-10-CM | POA: Insufficient documentation

## 2013-12-14 DIAGNOSIS — M25562 Pain in left knee: Secondary | ICD-10-CM

## 2013-12-14 DIAGNOSIS — M1712 Unilateral primary osteoarthritis, left knee: Secondary | ICD-10-CM

## 2013-12-14 DIAGNOSIS — IMO0002 Reserved for concepts with insufficient information to code with codable children: Secondary | ICD-10-CM

## 2013-12-14 DIAGNOSIS — M1710 Unilateral primary osteoarthritis, unspecified knee: Secondary | ICD-10-CM | POA: Insufficient documentation

## 2013-12-14 DIAGNOSIS — M533 Sacrococcygeal disorders, not elsewhere classified: Secondary | ICD-10-CM | POA: Insufficient documentation

## 2013-12-14 NOTE — Progress Notes (Signed)
[name] [address] [place], [place] [address] Phone: [phone] Subjective:    I'm seeing this patient by the request  of:  Gwendolyn Grant, MD   CC: Left knee and back pain  [id number] Erica [name] is a 73 y.o. female coming in with complaint of left knee and back pain. Regarding patient's left knee pain she states that this is starting multiple months ago. Patient does remember any incident where a large dog did run into the lateral aspect of her knee. Patient states that the pain seems to be on the medial aspect. She states that she was having a dull aching pain previously but after the accident it seemed to get much worse. Patient denies any significant swelling noted within. Patient states that the discomfort seems to be worse when she does a lot of activity such as standing for a long amount of time or going upstairs. Describes it as more of a dull aching sensation. Can be tender when touched. Patient denies any radiation of the leg or any numbness or weakness. Patient is a severity is 7/10. Patient has tried some ibuprofen with moderate improvement.  Patient is also having some left back pain. Patient does not know this came before or after the knee pain. Patient states that he can be painful in one location. Patient points mostly over the left SI joint. States it hurts more when she tries to cross her legs. Denies any significant radiation but states that she feels like she has a spasm in her left buttocks area sometimes. Denies any radiation of the leg or any numbness. States the severity is 3/10. Ibuprofen also helps this.     Past medical history, social, surgical and family history all reviewed in electronic medical record.   Review of Systems: No headache, visual changes, nausea, vomiting, diarrhea, constipation, dizziness, abdominal pain, skin rash, fevers, chills, night sweats, weight loss, swollen lymph nodes, body aches, joint  swelling, muscle aches, chest pain, shortness of breath, mood changes.   Objective Blood pressure 112/62, pulse 79, height 5\' 4"  (1.626 m), weight 139 lb (63.05 kg), SpO2 97.00%.  General: No apparent distress alert and oriented x3 mood and affect normal, dressed appropriately.  HEENT: Pupils equal, extraocular movements intact  Respiratory: Patient's speak in full sentences and does not appear short of breath  Cardiovascular: No lower extremity edema, non tender, no erythema  Skin: Warm dry intact with no signs of infection or rash on extremities or on axial skeleton.  Abdomen: Soft nontender  Neuro: Cranial nerves II through XII are intact, neurovascularly intact in all extremities with 2+ DTRs and 2+ pulses.  Lymph: No lymphadenopathy of posterior or anterior cervical chain or axillae bilaterally.  Gait normal with good balance and coordination.  MSK:  Non tender with full range of motion and good stability and symmetric strength and tone of shoulders, elbows, wrist, hip, nd ankles bilaterally.  Back Exam:  Inspection: Unremarkable  Motion: Flexion 45 deg, Extension 45 deg, Side Bending to 45 deg bilaterally,  Rotation to 45 deg bilaterally  SLR laying: Negative  XSLR laying: Negative  Palpable tenderness: Tender to palpation of the left SI joint FABER: Positive left. Sensory change: Gross sensation intact to all lumbar and sacral dermatomes.  Reflexes: 2+ at both patellar tendons, 2+ at achilles tendons, Babinski's downgoing.  Strength at foot  Plantar-flexion: 5/5 Dorsi-flexion: 5/5 Eversion: 5/5 Inversion: 5/5  Leg strength  Quad: 5/5 Hamstring: 5/5 Hip flexor: 5/5 Hip  abductors: 3/5  Gait unremarkable.  Knee: Left Normal to inspection with no erythema or effusion or obvious bony abnormalities. Tender to palpation over the medial joint line as well as the pes anserine area. ROM full in flexion and extension and lower leg rotation. Ligaments with solid consistent endpoints  including ACL, PCL, LCL, MCL. Negative Mcmurray's, Apley's, and Thessalonian tests. Non painful patellar compression. Patellar glide without crepitus. Patellar and quadriceps tendons unremarkable. Hamstring and quadriceps strength is normal. Patient hamstring are very tight Contralateral knee unremarkable   MSK US performed of: Left knee This study was ordered, performed, and interpreted by Charlann Boxer D.O.  Knee: All structures visualized. Anteromedial, anterolateral, posteromedial, and posterolateral menisci unremarkable without tearing, fraying, effusion, or displacement. Patient does have moderate narrowing of the medial joint line. Patient also has moderate narrowing of the patellofemoral space. Patellar Tendon unremarkable on long and transverse views without effusion. No abnormality of prepatellar bursa. LCL and MCL unremarkable on long and transverse views. No abnormality of origin of medial or lateral head of the gastrocnemius. Pes anserine bursitis noted  IMPRESSION: Moderate osteoarthritic changes with pes anserine bursitis    Impression and Recommendations:     This case required medical decision making of moderate complexity.

## 2013-12-14 NOTE — Assessment & Plan Note (Signed)
Patient also has what appears to be a pes anserine bursitis. We discussed the possibility of an injection which patient declined. Patient will try over-the-counter medications and topicals as well as stretching and icing protocol. Patient will try this and come back again in 3-4 weeks. If continued pain we'll consider injection.

## 2013-12-14 NOTE — Assessment & Plan Note (Signed)
Mild to moderate narrowing on the medial aspect of the knee. We discussed icing protocol as well as home exercise program. We discussed possibly a brace which patient declined. We discussed over-the-counter medications can be beneficial. Patient is going to try all these and come back in 3-4 weeks. Continuing to have pain I would consider bracing potential injection. We have also likely need x-rays.

## 2013-12-14 NOTE — Patient Instructions (Signed)
It is good to meet you Take tylenol 650 mg three times a day is the best evidence based medicine we have for arthritis.  Glucosamine sulfate 750mg  twice a day is a supplement that has been shown to help moderate to severe arthritis. Vitamin D 2000 IU daily Fish oil 2 grams daily.  Tumeric 500mg  twice daily.  Capsaicin topically up to four times a day may also help with pain. Exercises 3 times a week for hamstring and for knee.  Alternate days Do following exercises for back daily Sacroiliac Joint Mobilization and Rehab 1. Work on pretzel stretching, shoulder back and leg draped in front. 3-5 sets, 30 sec.. 2. hip abductor rotations. standing, hip flexion and rotation outward then inward. 3 sets, 15 reps. when can do comfortably, add ankle weights starting at 2 pounds.  3. cross over stretching - shoulder back to ground, same side leg crossover. 3-5 sets for 30 min..  4. rolling up and back knees to chest and rocking. 5. sacral tilt - 5 sets, hold for 5-10 seconds Come back in 3-4 weeks if not better.

## 2013-12-14 NOTE — Assessment & Plan Note (Signed)
Patient does have a sacroiliac joint dysfunction. Patient's likely has some osteoarthritis of that left SI joint. Patient given mobilization techniques. Patient will try this and icing and we'll see if she makes any improvement. Patient may respond to osteopathic manipulative.

## 2014-01-11 ENCOUNTER — Ambulatory Visit: Payer: Medicare PPO | Admitting: Family Medicine

## 2014-03-02 IMAGING — CT CT ABD-PELV W/O CM
1 series · 14 of 20 positions shown, 19 images · non-contrast
Comparison: None.

CLINICAL DATA: Left flank and lower abdominal pain

CT ABDOMEN AND PELVIS WITHOUT CONTRAST
TECHNIQUE: Multidetector CT imaging of the abdomen and pelvis was
performed following the standard protocol without intravenous
contrast.

[Series 3: lung · axial · 0.64mm/px · z∈[-191,-106]mm · 14 of 20 slices shown, 19 images]
[im 2/20  soft-tissue]
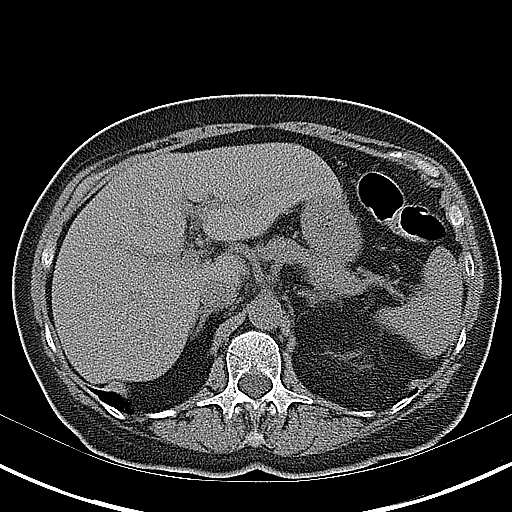
[im 2/20  bone]
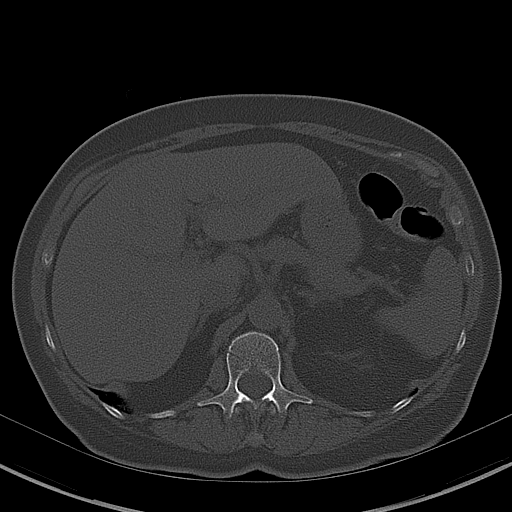
[im 4/20  soft-tissue]
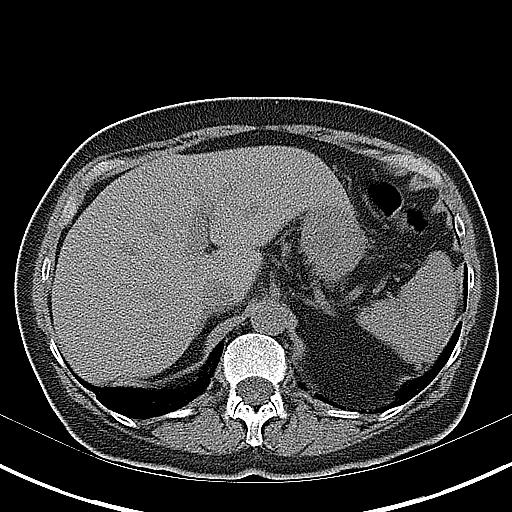
[im 5/20  soft-tissue]
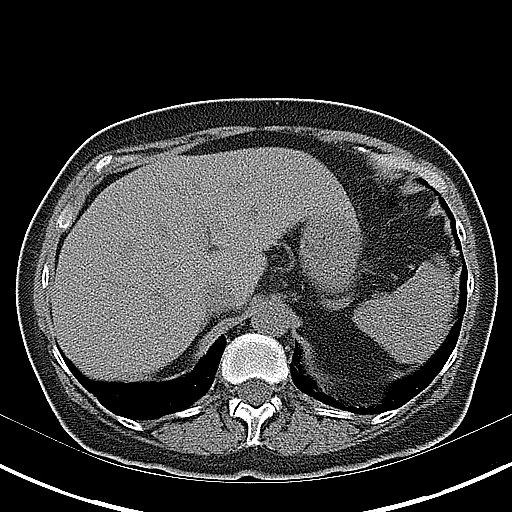
[im 6/20  soft-tissue]
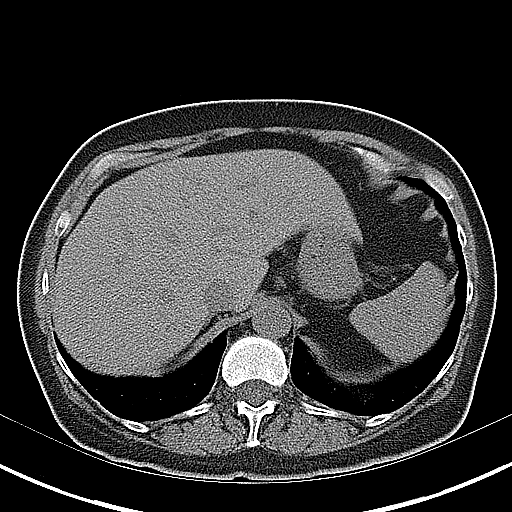
[im 8/20  soft-tissue]
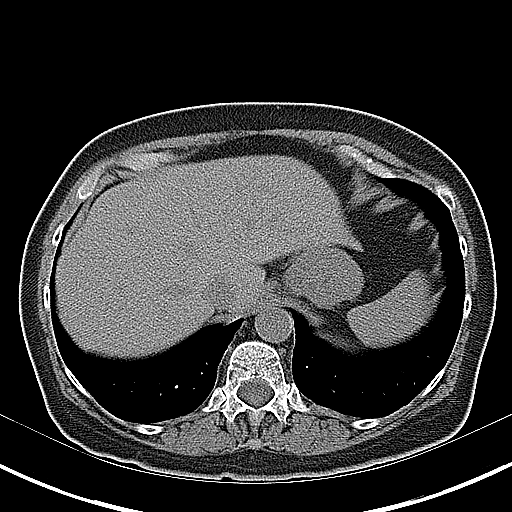
[im 9/20  soft-tissue]
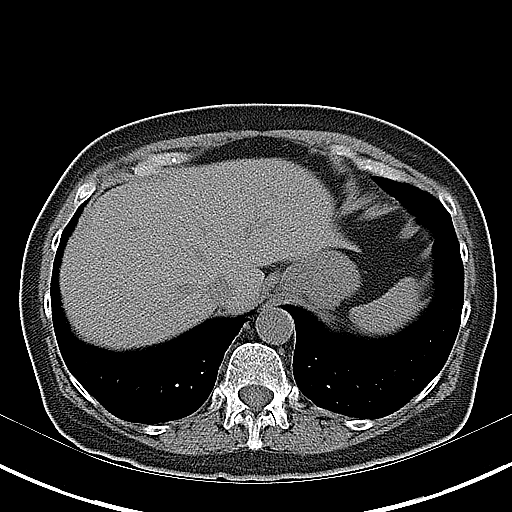
[im 11/20  soft-tissue]
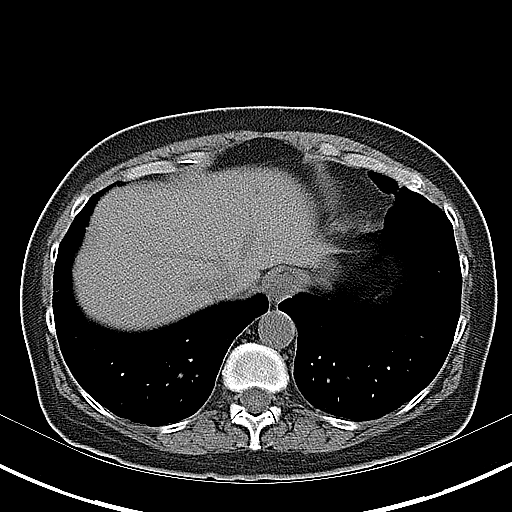
[im 12/20  soft-tissue]
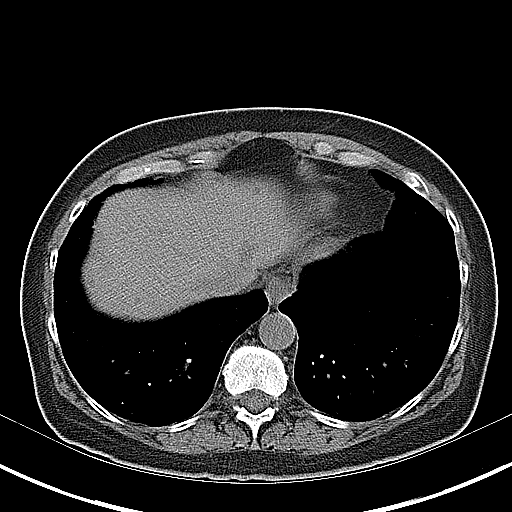
[im 13/20  soft-tissue]
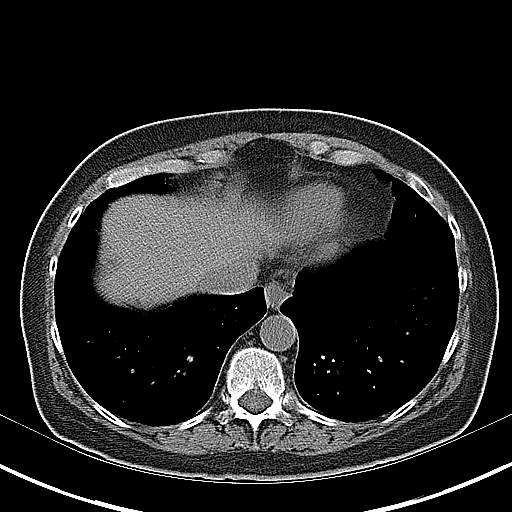
[im 13/20  bone]
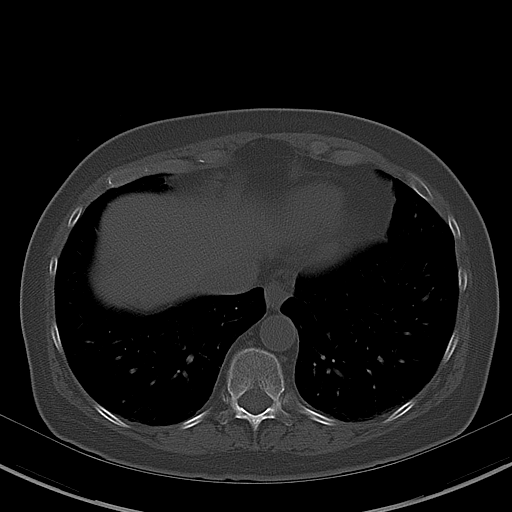
[im 15/20  soft-tissue]
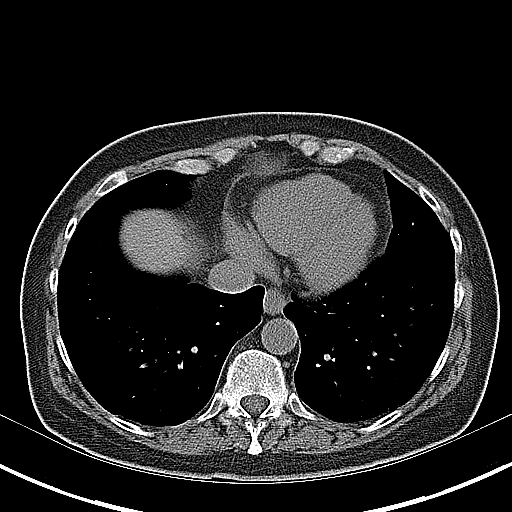
[im 16/20  soft-tissue]
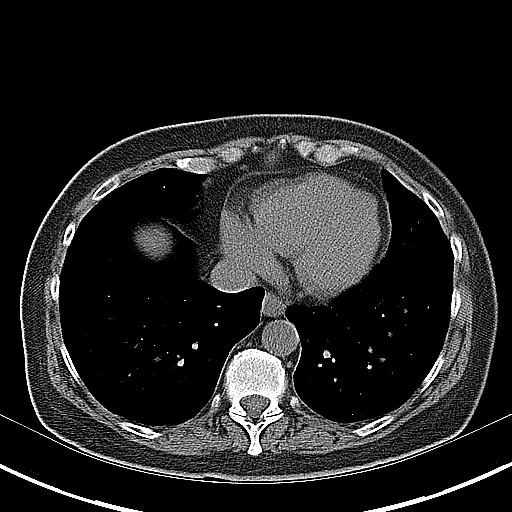
[im 16/20  lung]
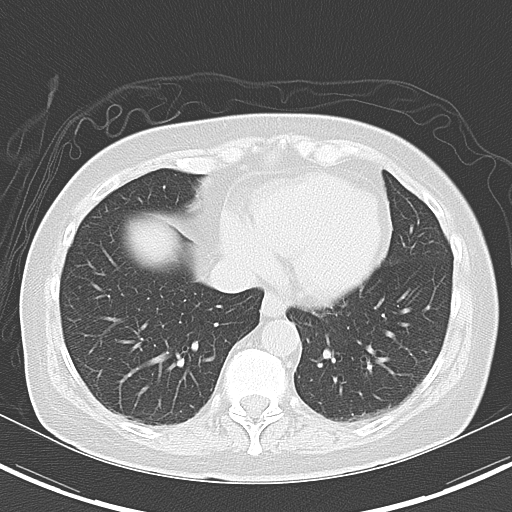
[im 17/20  soft-tissue]
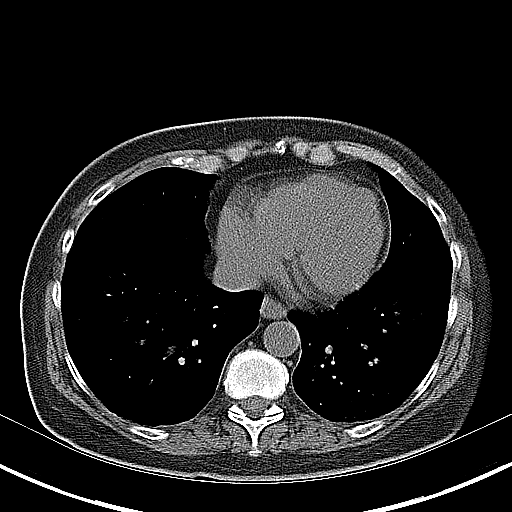
[im 17/20  lung]
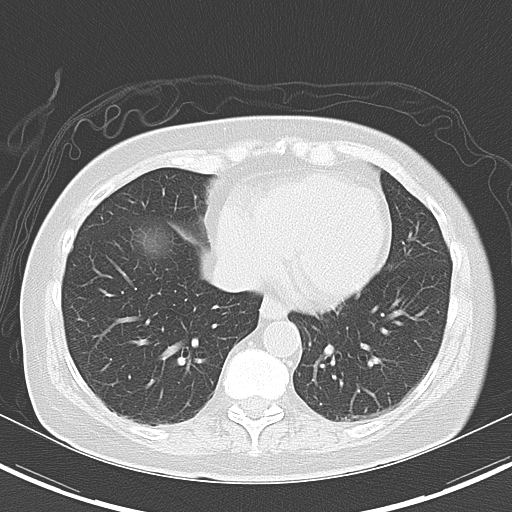
[im 18/20  lung]
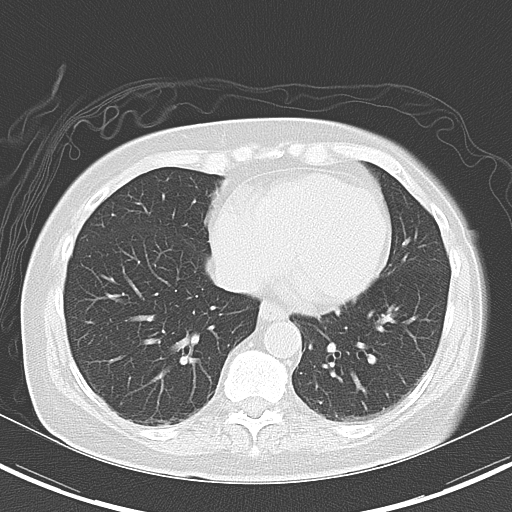
[im 19/20  soft-tissue]
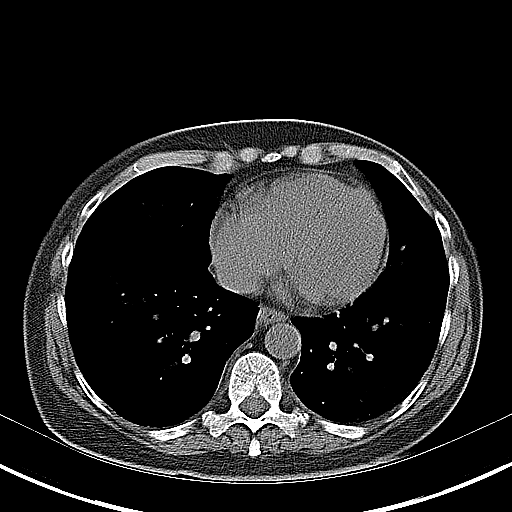
[im 19/20  lung]
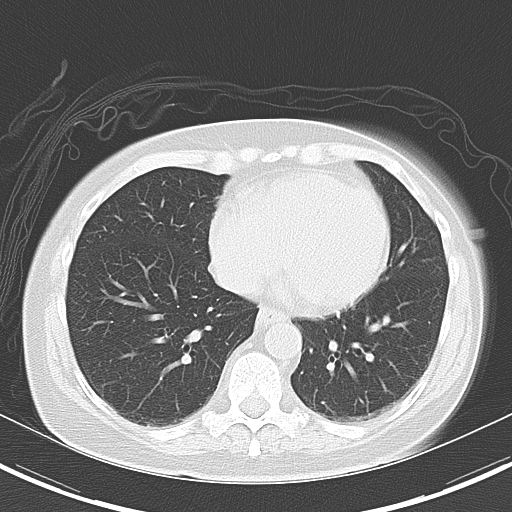

[14 of 20 positions shown; findings below may reference images not displayed]

FINDINGS: Limited images through the lung bases demonstrate no
significant appreciable abnormality. The heart size is within
normal limits. No pleural or pericardial effusion.

Organ abnormality/lesion detection is limited in the absence of
intravenous contrast. Within this limitation, there are a couple
hypodensities within the liver, too small to further characterize.
Unremarkable biliary system, spleen, pancreas, adrenal glands.

Left perinephric fat stranding and minimal hydroureteronephrosis to
the level of a 3 x 7 mm (transverse by craniocaudal) stone or
stones at the left UVJ.  The distal left ureter looks mildly
thickened as appreciated on coronal images 49 and 48.

Colonic diverticulosis.  No CT evidence for colitis or
diverticulitis.  Normal appendix.  Small bowel loops are normal
course and caliber. No free intraperitoneal air or fluid.  No
lymphadenopathy.

There is scattered atherosclerotic calcification of the aorta and
its branches. No aneurysmal dilatation.

Thin-walled bladder.  Unremarkable CT appearance to the uterus and
adnexa.

Osteopenia and multilevel degenerative changes.  No acute osseous
finding.
IMPRESSION: Left perinephric fat stranding and mild hydroureteronephrosis to
the level of a 3 x 7 mm stone at the UVJ.  Thickening of the distal
left ureter may be reactive however warrants attention with a short-
term follow-up (ideally contrast enhanced CT urogram) after
symptoms resolve.

## 2014-03-09 ENCOUNTER — Encounter: Payer: Self-pay | Admitting: Internal Medicine

## 2014-03-17 ENCOUNTER — Other Ambulatory Visit: Payer: Self-pay

## 2014-03-17 LAB — HM MAMMOGRAPHY

## 2014-03-17 MED ORDER — CYANOCOBALAMIN 1000 MCG/ML IJ SOLN: INTRAMUSCULAR | Status: DC

## 2014-03-24 ENCOUNTER — Encounter: Payer: Self-pay | Admitting: Internal Medicine

## 2014-04-06 ENCOUNTER — Encounter: Payer: Self-pay | Admitting: Internal Medicine

## 2014-05-24 ENCOUNTER — Other Ambulatory Visit (INDEPENDENT_AMBULATORY_CARE_PROVIDER_SITE_OTHER): Payer: Medicare PPO

## 2014-05-24 ENCOUNTER — Ambulatory Visit (INDEPENDENT_AMBULATORY_CARE_PROVIDER_SITE_OTHER): Payer: Medicare PPO | Admitting: Internal Medicine

## 2014-05-24 ENCOUNTER — Encounter: Payer: Self-pay | Admitting: Internal Medicine

## 2014-05-24 VITALS — BP 112/62 | HR 72 | Temp 98.3°F | Ht 64.0 in | Wt 142.5 lb

## 2014-05-24 DIAGNOSIS — R21 Rash and other nonspecific skin eruption: Secondary | ICD-10-CM

## 2014-05-24 DIAGNOSIS — E119 Type 2 diabetes mellitus without complications: Secondary | ICD-10-CM

## 2014-05-24 DIAGNOSIS — T7840XA Allergy, unspecified, initial encounter: Secondary | ICD-10-CM

## 2014-05-24 LAB — LIPID PANEL
Cholesterol: 204 mg/dL — ABNORMAL HIGH (ref 0–200)
HDL: 39.1 mg/dL (ref >39.00)
LDL Cholesterol: 133 mg/dL — ABNORMAL HIGH (ref 0–99)
NonHDL: 164.9
Total CHOL/HDL Ratio: 5
Triglycerides: 159 mg/dL — ABNORMAL HIGH (ref 0.0–149.0)
VLDL: 31.8 mg/dL (ref 0.0–40.0)

## 2014-05-24 LAB — HEMOGLOBIN A1C: Hgb A1c MFr Bld: 6.3 % (ref 4.6–6.5)

## 2014-05-24 NOTE — Progress Notes (Signed)
Subjective:    Patient ID: Erica [name], female    DOB: [birth year], 73 y.o.   MRN: [id number]  HPI  Patient is here for rash B thighs - onset 1 week  Also reviewed chronic medical issues and interval medical events  Past Medical History  Diagnosis Date  . Diverticulosis   . Breast nodule   . Allergic rhinitis   . Arthritis   . Asthma   . Diet-controlled type 2 diabetes mellitus 09/2011 dx  . Recurrent kidney stones   . Dyslipidemia     Review of Systems  Constitutional: Positive for fatigue ("stress" re: spouse lung illness). Negative for fever and unexpected weight change.  Respiratory: Negative for cough and shortness of breath.   Cardiovascular: Negative for chest pain and leg swelling.  Skin: Positive for rash. Negative for wound.       Objective:   Physical Exam  BP 112/62 mmHg  Pulse 72  Temp(Src) 98.3 F (36.8 C) (Oral)  Ht 5\' 4"  (1.626 m)  Wt 142 lb 8 oz (64.638 kg)  BMI 24.45 kg/m2  SpO2 96% Wt Readings from Last 3 Encounters:  05/24/14 142 lb 8 oz (64.638 kg)  12/14/13 139 lb (63.05 kg)  12/06/13 140 lb 12.8 oz (63.866 kg)   Constitutional: She appears well-developed and well-nourished. No distress.  Neck: Normal range of motion. Neck supple. No JVD present. No thyromegaly present.  Cardiovascular: Normal rate, regular rhythm and normal heart sounds.  No murmur heard. No BLE edema. Pulmonary/Chest: Effort normal and breath sounds normal. No respiratory distress. She has no wheezes.  Skin: resolving ectopy B inner thighs R>L Psychiatric: She has a normal mood and affect. Her behavior is normal. Judgment and thought content normal.   Lab Results  Component Value Date   WBC 6.0 03/06/2013   HGB 14.4 03/06/2013   HCT 42.9 03/06/2013   PLT 260 03/06/2013   GLUCOSE 98 03/06/2013   CHOL 191 06/01/2013   TRIG 91.0 06/01/2013   HDL 39.00* 06/01/2013   LDLDIRECT 149.9 02/25/2013   LDLCALC 134* 06/01/2013   ALT 12 03/06/2013   AST 15 03/06/2013   NA 138 03/06/2013   K 4.2 03/06/2013   CL 105 03/06/2013   CREATININE 0.80 03/06/2013   BUN 17 03/06/2013   CO2 23 03/06/2013   TSH 1.35 08/18/2012   INR 1.0 01/24/2009   HGBA1C 6.1 12/06/2013   MICROALBUR 1.1 09/28/2012    Dg Chest 2 View  03/06/2013   CLINICAL DATA:  Chest pain  EXAM: CHEST  2 VIEW  COMPARISON:  05/27/2009  FINDINGS: The heart size and mediastinal contours are within normal limits. Both lungs are clear. The visualized skeletal structures are unremarkable.  IMPRESSION: No active cardiopulmonary disease.   Electronically Signed   By: Lahoma Crocker   On: 03/06/2013 12:28       Assessment & Plan:   Ectopic rash/contact dermatits - unknown contact -?exposure to dog during holidays - reassurance provided and to continue symptomatic care  Problem List Items Addressed This Visit    Diet-controlled type 2 diabetes mellitus    Lab Results  Component Value Date   HGBA1C 6.1 12/06/2013   Diet controlled diabetes by A1c, dx 09/2011 improved from 6.23 September 2011 with weight loss and improved diet/exercise recheck now Check a1c q6-12 mo to montor same The patient is asked to make an attempt to improve diet and exercise patterns to aid in medical management of this problem.    Relevant Orders  Hemoglobin A1c      Lipid panel    Other Visit Diagnoses    Rash due to allergy    -  Primary

## 2014-05-24 NOTE — Progress Notes (Signed)
Pre visit review using our clinic review tool, if applicable. No additional management support is needed unless otherwise documented below in the visit note. 

## 2014-05-24 NOTE — Patient Instructions (Addendum)

## 2014-05-24 NOTE — Assessment & Plan Note (Signed)
Lab Results  Component Value Date   HGBA1C 6.1 12/06/2013   Diet controlled diabetes by A1c, dx 09/2011 improved from 6.23 September 2011 with weight loss and improved diet/exercise recheck now Check a1c q6-12 mo to montor same The patient is asked to make an attempt to improve diet and exercise patterns to aid in medical management of this problem.

## 2014-06-03 ENCOUNTER — Encounter: Payer: Self-pay | Admitting: Internal Medicine

## 2014-06-04 ENCOUNTER — Other Ambulatory Visit: Payer: Self-pay

## 2014-06-04 MED ORDER — CYANOCOBALAMIN 1000 MCG/ML IJ SOLN: 1000.0000 ug | INTRAMUSCULAR | Status: DC

## 2014-07-06 ENCOUNTER — Encounter: Payer: Medicare PPO | Admitting: Internal Medicine

## 2014-08-09 DIAGNOSIS — H25813 Combined forms of age-related cataract, bilateral: Secondary | ICD-10-CM | POA: Diagnosis not present

## 2014-08-09 DIAGNOSIS — H40013 Open angle with borderline findings, low risk, bilateral: Secondary | ICD-10-CM | POA: Diagnosis not present

## 2014-08-09 DIAGNOSIS — H3531 Nonexudative age-related macular degeneration: Secondary | ICD-10-CM | POA: Diagnosis not present

## 2014-08-14 ENCOUNTER — Encounter: Payer: Self-pay | Admitting: Internal Medicine

## 2014-08-15 NOTE — Telephone Encounter (Signed)
Please schedule her as acute with any of my partners for evaluation of these problems/symptoms . Thanks

## 2014-08-17 ENCOUNTER — Encounter: Payer: Self-pay | Admitting: Internal Medicine

## 2014-08-17 ENCOUNTER — Ambulatory Visit (INDEPENDENT_AMBULATORY_CARE_PROVIDER_SITE_OTHER): Payer: Medicare PPO | Admitting: Internal Medicine

## 2014-08-17 ENCOUNTER — Other Ambulatory Visit (INDEPENDENT_AMBULATORY_CARE_PROVIDER_SITE_OTHER): Payer: Medicare PPO

## 2014-08-17 VITALS — BP 114/62 | HR 77 | Temp 98.4°F | Ht 64.0 in | Wt 143.1 lb

## 2014-08-17 DIAGNOSIS — E785 Hyperlipidemia, unspecified: Secondary | ICD-10-CM

## 2014-08-17 DIAGNOSIS — R1011 Right upper quadrant pain: Secondary | ICD-10-CM

## 2014-08-17 LAB — CBC WITH DIFFERENTIAL/PLATELET
Basophils Absolute: 0 10*3/uL (ref 0.0–0.1)
Basophils Relative: 0.5 % (ref 0.0–3.0)
Eosinophils Absolute: 0.3 10*3/uL (ref 0.0–0.7)
Eosinophils Relative: 4.3 % (ref 0.0–5.0)
HCT: 41.2 % (ref 36.0–46.0)
Hemoglobin: 14 g/dL (ref 12.0–15.0)
Lymphocytes Relative: 36.5 % (ref 12.0–46.0)
Lymphs Abs: 2.7 10*3/uL (ref 0.7–4.0)
MCHC: 34 g/dL (ref 30.0–36.0)
MCV: 88.2 fl (ref 78.0–100.0)
Monocytes Absolute: 0.6 10*3/uL (ref 0.1–1.0)
Monocytes Relative: 8.4 % (ref 3.0–12.0)
Neutro Abs: 3.7 10*3/uL (ref 1.4–7.7)
Neutrophils Relative %: 50.3 % (ref 43.0–77.0)
Platelets: 268 10*3/uL (ref 150.0–400.0)
RBC: 4.67 Mil/uL (ref 3.87–5.11)
RDW: 13.1 % (ref 11.5–15.5)
WBC: 7.3 10*3/uL (ref 4.0–10.5)

## 2014-08-17 MED ORDER — RANITIDINE HCL 150 MG PO TABS: 150.0000 mg | ORAL_TABLET | Freq: Two times a day (BID) | ORAL | Status: DC

## 2014-08-17 NOTE — Progress Notes (Signed)
Pre visit review using our clinic review tool, if applicable. No additional management support is needed unless otherwise documented below in the visit note. 

## 2014-08-17 NOTE — Progress Notes (Signed)
   Subjective:    Patient ID: Erica [name], female    DOB: 1940-12-05, 74 y.o.   MRN: [id number]  HPI Symptoms began 07/30/14 as attack of severe indigestion associated with bloating and gas. It occurred after she'd eaten a heavy [place] meal which included cream sauces well as cheesecake. The pain radiated to the right scapula and neck. The pain was severe as a level VII  & described as sharp and achy, "like gas pains". That episode lasted four-5 hours &  resolved after she fell asleep in a chair. She's continued to have much milder episodes which are described as dull ache and shorter in induration after meals. She has decreased fatty foods. She took Gas-X with some benefit.  She's had some associated nausea but no vomiting. She describes the nausea as "queasiness".  Loose stools are normal for her.  She's had  fatigue but actually this actually was present prior to the onset of symptoms.  She previously drank 6 ounces of alcohol per week but has discontinued this.   Her mother had gallbladder disease.  She has a history of dyslipidemia; she has chosen not to take a statin. Her father had a elevated cholesterol as did her paternal grandfather and paternal grandmother. Paternal grandmother had a heart attack at 15.  Review of Systems  Exertional chest pain, palpitations, tachycardia, exertional dyspnea, paroxysmal nocturnal dyspnea, claudication or edema are absent.  Unexplained weight loss, significant dysphagia, melena, rectal bleeding, or persistently small caliber stools are denied.     Objective:   Physical Exam  Pertinent or positive findings include: She has slight nasal septal dislocation.  General appearance is one of good health and nourishment w/o distress. Eyes: No conjunctival inflammation or scleral icterus is present. Oral exam: Dental hygiene is good; lips and gums are healthy appearing.There is no oropharyngeal erythema or exudate noted.  Heart:  Normal rate and  regular rhythm. S1 and S2 normal without gallop, murmur, click, rub or other extra sounds   Lungs:Chest clear to auscultation; no wheezes, rhonchi,rales ,or rubs present.No increased work of breathing.  Abdomen: bowel sounds normal, soft and non-tender without masses, organomegaly or hernias noted.  No guarding or rebound . No tenderness over the flanks to percussion Musculoskeletal: Able to lie flat and sit up without help. Negative straight leg raising bilaterally. Gait normal Skin:Warm & dry.  Intact without suspicious lesions or rashes ; no jaundice or tenting Lymphatic: No lymphadenopathy is noted about the head, neck, axilla            Assessment & Plan:  #1 postprandial abdominal pain with severe episode 07/30/14 suggesting classic gallbladder disease. Response to Gas-X raises question of possible GERD component  #2 dyslipidemia; she has statin phobic. She does have a family history of premature heart disease.  Plan: Acute labs will be checked at this time.  To optimally assess her cholesterol associated risks; NMR LipoProfile could be performed. Additional labs which would be needed for her 08/25/14 CPX are BMET & TSH.

## 2014-08-17 NOTE — Patient Instructions (Signed)
Please avoid fatty or greasy meals as the symptoms do suggest possible gallbladder disease.  Reflux of gastric acid may be asymptomatic as this may occur mainly during sleep.The triggers for reflux  include stress; the "aspirin family" ; alcohol; peppermint; and caffeine (coffee, tea, cola, and chocolate). The aspirin family would include aspirin and the nonsteroidal agents such as ibuprofen &  Naproxen. Tylenol would not cause reflux. If having symptoms ; food & drink should be avoided for @ least 2 hours before going to bed.

## 2014-08-18 DIAGNOSIS — H35363 Drusen (degenerative) of macula, bilateral: Secondary | ICD-10-CM | POA: Diagnosis not present

## 2014-08-18 LAB — HEPATIC FUNCTION PANEL
ALT: 13 U/L (ref 0–35)
AST: 14 U/L (ref 0–37)
Albumin: 4.2 g/dL (ref 3.5–5.2)
Alkaline Phosphatase: 66 U/L (ref 39–117)
Bilirubin, Direct: 0 mg/dL (ref 0.0–0.3)
Total Bilirubin: 0.2 mg/dL (ref 0.2–1.2)
Total Protein: 6.8 g/dL (ref 6.0–8.3)

## 2014-08-18 LAB — AMYLASE: Amylase: 52 U/L (ref 27–131)

## 2014-08-18 LAB — LIPASE: Lipase: 21 U/L (ref 11.0–59.0)

## 2014-08-22 ENCOUNTER — Ambulatory Visit
Admission: RE | Admit: 2014-08-22 | Discharge: 2014-08-22 | Disposition: A | Discharge status: Home / Self Care | Payer: Medicare PPO | Source: Ambulatory Visit | Attending: Internal Medicine | Admitting: Internal Medicine

## 2014-08-22 DIAGNOSIS — R1011 Right upper quadrant pain: Secondary | ICD-10-CM

## 2014-09-01 ENCOUNTER — Ambulatory Visit (INDEPENDENT_AMBULATORY_CARE_PROVIDER_SITE_OTHER): Payer: Medicare PPO | Admitting: Internal Medicine

## 2014-09-01 ENCOUNTER — Encounter: Payer: Self-pay | Admitting: Internal Medicine

## 2014-09-01 ENCOUNTER — Other Ambulatory Visit (INDEPENDENT_AMBULATORY_CARE_PROVIDER_SITE_OTHER): Payer: Medicare PPO

## 2014-09-01 VITALS — BP 114/70 | HR 61 | Temp 98.1°F | Ht 64.0 in | Wt 142.5 lb

## 2014-09-01 DIAGNOSIS — E785 Hyperlipidemia, unspecified: Secondary | ICD-10-CM

## 2014-09-01 DIAGNOSIS — R300 Dysuria: Secondary | ICD-10-CM | POA: Diagnosis not present

## 2014-09-01 DIAGNOSIS — E119 Type 2 diabetes mellitus without complications: Secondary | ICD-10-CM

## 2014-09-01 DIAGNOSIS — R0789 Other chest pain: Secondary | ICD-10-CM

## 2014-09-01 DIAGNOSIS — Z0000 Encounter for general adult medical examination without abnormal findings: Secondary | ICD-10-CM

## 2014-09-01 LAB — URINALYSIS, ROUTINE W REFLEX MICROSCOPIC
Bilirubin Urine: NEGATIVE
Hgb urine dipstick: NEGATIVE
Ketones, ur: NEGATIVE
Leukocytes, UA: NEGATIVE
Nitrite: NEGATIVE
RBC / HPF: NONE SEEN (ref 0–?)
Specific Gravity, Urine: 1.02 (ref 1.000–1.030)
Total Protein, Urine: NEGATIVE
Urine Glucose: NEGATIVE
Urobilinogen, UA: 0.2 (ref 0.0–1.0)
WBC, UA: NONE SEEN (ref 0–?)
pH: 7 (ref 5.0–8.0)

## 2014-09-01 LAB — MICROALBUMIN / CREATININE URINE RATIO
Creatinine,U: 141.1 mg/dL
Microalb Creat Ratio: 0.5 mg/g (ref 0.0–30.0)
Microalb, Ur: <0.7 mg/dL (ref 0.0–1.9)

## 2014-09-01 LAB — BASIC METABOLIC PANEL
BUN: 15 mg/dL (ref 6–23)
CO2: 29 mEq/L (ref 19–32)
Calcium: 9.1 mg/dL (ref 8.4–10.5)
Chloride: 106 mEq/L (ref 96–112)
Creatinine, Ser: 0.83 mg/dL (ref 0.40–1.20)
GFR: 71.03 mL/min (ref >60.00)
Glucose, Bld: 101 mg/dL — ABNORMAL HIGH (ref 70–99)
Potassium: 4.3 mEq/L (ref 3.5–5.1)
Sodium: 139 mEq/L (ref 135–145)

## 2014-09-01 LAB — HEMOGLOBIN A1C: Hgb A1c MFr Bld: 6.1 % (ref 4.6–6.5)

## 2014-09-01 LAB — LIPID PANEL
Cholesterol: 186 mg/dL (ref 0–200)
HDL: 38.3 mg/dL — ABNORMAL LOW (ref >39.00)
LDL Cholesterol: 128 mg/dL — ABNORMAL HIGH (ref 0–99)
NonHDL: 147.7
Total CHOL/HDL Ratio: 5
Triglycerides: 99 mg/dL (ref 0.0–149.0)
VLDL: 19.8 mg/dL (ref 0.0–40.0)

## 2014-09-01 LAB — TSH: TSH: 1.93 u[IU]/mL (ref 0.35–4.50)

## 2014-09-01 MED ORDER — CYANOCOBALAMIN 1000 MCG/ML IJ SOLN: 1000.0000 ug | INTRAMUSCULAR | Status: DC

## 2014-09-01 MED ORDER — FLUCONAZOLE 150 MG PO TABS: 150.0000 mg | ORAL_TABLET | Freq: Once | ORAL | Status: DC

## 2014-09-01 MED ORDER — ASPIRIN EC 81 MG PO TBEC: 81.0000 mg | DELAYED_RELEASE_TABLET | Freq: Every day | ORAL | Status: DC

## 2014-09-01 NOTE — Patient Instructions (Addendum)
It was good to see you today.  We have reviewed your prior records including labs and tests today  Health Maintenance reviewed - all recommended immunizations and age-appropriate screenings are up-to-date.  Test(s) ordered today. Your results will be released to MyChart (or called to you) after review, usually within 72hours after test completion. If any changes need to be made, you will be notified at that same time.  Medications reviewed and updated Diflucan sent for your yeast symptoms Start baby aspirin daily at this time no other changes recommended at this time.  we'll make referral for cardiac stress test . Our office will contact you regarding appointment(s) once made.  Please schedule followup in 6 months for semiannual exam and labs, call sooner if problems.    Health Maintenance Adopting a healthy lifestyle and getting preventive care can go a long way to promote health and wellness. Talk with your health care provider about what schedule of regular examinations is right for you. This is a good chance for you to check in with your provider about disease prevention and staying healthy. In between checkups, there are plenty of things you can do on your own. Experts have done a lot of research about which lifestyle changes and preventive measures are most likely to keep you healthy. Ask your health care provider for more information. WEIGHT AND DIET  Eat a healthy diet  Be sure to include plenty of vegetables, fruits, low-fat dairy products, and lean protein.  Do not eat a lot of foods high in solid fats, added sugars, or salt.  Get regular exercise. This is one of the most important things you can do for your health.  Most adults should exercise for at least 150 minutes each week. The exercise should increase your heart rate and make you sweat (moderate-intensity exercise).  Most adults should also do strengthening exercises at least twice a week. This is in addition to the  moderate-intensity exercise.  Maintain a healthy weight  Body mass index (BMI) is a measurement that can be used to identify possible weight problems. It estimates body fat based on height and weight. Your health care provider can help determine your BMI and help you achieve or maintain a healthy weight.  For females 20 years of age and older:   A BMI below 18.5 is considered underweight.  A BMI of 18.5 to 24.9 is normal.  A BMI of 25 to 29.9 is considered overweight.  A BMI of 30 and above is considered obese.  Watch levels of cholesterol and blood lipids  You should start having your blood tested for lipids and cholesterol at 76 years of age, then have this test every 5 years.  You may need to have your cholesterol levels checked more often if:  Your lipid or cholesterol levels are high.  You are older than 76 years of age.  You are at high risk for heart disease.  CANCER SCREENING   Lung Cancer  Lung cancer screening is recommended for adults 55-80 years old who are at high risk for lung cancer because of a history of smoking.  A yearly low-dose CT scan of the lungs is recommended for people who:  Currently smoke.  Have quit within the past 15 years.  Have at least a 30-pack-year history of smoking. A pack year is smoking an average of one pack of cigarettes a day for 1 year.  Yearly screening should continue until it has been 15 years since you quit.  Yearly   screening should stop if you develop a health problem that would prevent you from having lung cancer treatment.  Breast Cancer  Practice breast self-awareness. This means understanding how your breasts normally appear and feel.  It also means doing regular breast self-exams. Let your health care provider know about any changes, no matter how small.  If you are in your 20s or 30s, you should have a clinical breast exam (CBE) by a health care provider every 1-3 years as part of a regular health exam.  If  you are 40 or older, have a CBE every year. Also consider having a breast X-ray (mammogram) every year.  If you have a family history of breast cancer, talk to your health care provider about genetic screening.  If you are at high risk for breast cancer, talk to your health care provider about having an MRI and a mammogram every year.  Breast cancer gene (BRCA) assessment is recommended for women who have family members with BRCA-related cancers. BRCA-related cancers include:  Breast.  Ovarian.  Tubal.  Peritoneal cancers.  Results of the assessment will determine the need for genetic counseling and BRCA1 and BRCA2 testing. Cervical Cancer Routine pelvic examinations to screen for cervical cancer are no longer recommended for nonpregnant women who are considered low risk for cancer of the pelvic organs (ovaries, uterus, and vagina) and who do not have symptoms. A pelvic examination may be necessary if you have symptoms including those associated with pelvic infections. Ask your health care provider if a screening pelvic exam is right for you.   The Pap test is the screening test for cervical cancer for women who are considered at risk.  If you had a hysterectomy for a problem that was not cancer or a condition that could lead to cancer, then you no longer need Pap tests.  If you are older than 65 years, and you have had normal Pap tests for the past 10 years, you no longer need to have Pap tests.  If you have had past treatment for cervical cancer or a condition that could lead to cancer, you need Pap tests and screening for cancer for at least 20 years after your treatment.  If you no longer get a Pap test, assess your risk factors if they change (such as having a new sexual partner). This can affect whether you should start being screened again.  Some women have medical problems that increase their chance of getting cervical cancer. If this is the case for you, your health care  provider may recommend more frequent screening and Pap tests.  The human papillomavirus (HPV) test is another test that may be used for cervical cancer screening. The HPV test looks for the virus that can cause cell changes in the cervix. The cells collected during the Pap test can be tested for HPV.  The HPV test can be used to screen women 30 years of age and older. Getting tested for HPV can extend the interval between normal Pap tests from three to five years.  An HPV test also should be used to screen women of any age who have unclear Pap test results.  After 76 years of age, women should have HPV testing as often as Pap tests.  Colorectal Cancer  This type of cancer can be detected and often prevented.  Routine colorectal cancer screening usually begins at 76 years of age and continues through 75 years of age.  Your health care provider may recommend screening at an   earlier age if you have risk factors for colon cancer.  Your health care provider may also recommend using home test kits to check for hidden blood in the stool.  A small camera at the end of a tube can be used to examine your colon directly (sigmoidoscopy or colonoscopy). This is done to check for the earliest forms of colorectal cancer.  Routine screening usually begins at age 3.  Direct examination of the colon should be repeated every 5-10 years through 76 years of age. However, you may need to be screened more often if early forms of precancerous polyps or small growths are found. Skin Cancer  Check your skin from head to toe regularly.  Tell your health care provider about any new moles or changes in moles, especially if there is a change in a mole's shape or color.  Also tell your health care provider if you have a mole that is larger than the size of a pencil eraser.  Always use sunscreen. Apply sunscreen liberally and repeatedly throughout the day.  Protect yourself by wearing long sleeves, pants, a  wide-brimmed hat, and sunglasses whenever you are outside. HEART DISEASE, DIABETES, AND HIGH BLOOD PRESSURE   Have your blood pressure checked at least every 1-2 years. High blood pressure causes heart disease and increases the risk of stroke.  If you are between 8 years and 36 years old, ask your health care provider if you should take aspirin to prevent strokes.  Have regular diabetes screenings. This involves taking a blood sample to check your fasting blood sugar level.  If you are at a normal weight and have a low risk for diabetes, have this test once every three years after 76 years of age.  If you are overweight and have a high risk for diabetes, consider being tested at a younger age or more often. PREVENTING INFECTION  Hepatitis B  If you have a higher risk for hepatitis B, you should be screened for this virus. You are considered at high risk for hepatitis B if:  You were born in a country where hepatitis B is common. Ask your health care provider which countries are considered high risk.  Your parents were born in a high-risk country, and you have not been immunized against hepatitis B (hepatitis B vaccine).  You have HIV or AIDS.  You use needles to inject street drugs.  You live with someone who has hepatitis B.  You have had sex with someone who has hepatitis B.  You get hemodialysis treatment.  You take certain medicines for conditions, including cancer, organ transplantation, and autoimmune conditions. Hepatitis C  Blood testing is recommended for:  Everyone born from [birth year] through 1967.  Anyone with known risk factors for hepatitis C. Sexually transmitted infections (STIs)  You should be screened for sexually transmitted infections (STIs) including gonorrhea and chlamydia if:  You are sexually active and are younger than 76 years of age.  You are older than 76 years of age and your health care provider tells you that you are at risk for this type of  infection.  Your sexual activity has changed since you were last screened and you are at an increased risk for chlamydia or gonorrhea. Ask your health care provider if you are at risk.  If you do not have HIV, but are at risk, it may be recommended that you take a prescription medicine daily to prevent HIV infection. This is called pre-exposure prophylaxis (PrEP). You are considered at risk  if:  You are sexually active and do not regularly use condoms or know the HIV status of your partner(s).  You take drugs by injection.  You are sexually active with a partner who has HIV. Talk with your health care provider about whether you are at high risk of being infected with HIV. If you choose to begin PrEP, you should first be tested for HIV. You should then be tested every 3 months for as long as you are taking PrEP.  PREGNANCY   If you are premenopausal and you may become pregnant, ask your health care provider about preconception counseling.  If you may become pregnant, take 400 to 800 micrograms (mcg) of folic acid every day.  If you want to prevent pregnancy, talk to your health care provider about birth control (contraception). OSTEOPOROSIS AND MENOPAUSE   Osteoporosis is a disease in which the bones lose minerals and strength with aging. This can result in serious bone fractures. Your risk for osteoporosis can be identified using a bone density scan.  If you are 19 years of age or older, or if you are at risk for osteoporosis and fractures, ask your health care provider if you should be screened.  Ask your health care provider whether you should take a calcium or vitamin D supplement to lower your risk for osteoporosis.  Menopause may have certain physical symptoms and risks.  Hormone replacement therapy may reduce some of these symptoms and risks. Talk to your health care provider about whether hormone replacement therapy is right for you.  HOME CARE INSTRUCTIONS   Schedule regular  health, dental, and eye exams.  Stay current with your immunizations.   Do not use any tobacco products including cigarettes, chewing tobacco, or electronic cigarettes.  If you are pregnant, do not drink alcohol.  If you are breastfeeding, limit how much and how often you drink alcohol.  Limit alcohol intake to no more than 1 drink per day for nonpregnant women. One drink equals 12 ounces of beer, 5 ounces of wine, or 1 ounces of hard liquor.  Do not use street drugs.  Do not share needles.  Ask your health care provider for help if you need support or information about quitting drugs.  Tell your health care provider if you often feel depressed.  Tell your health care provider if you have ever been abused or do not feel safe at home. Document Released: 12/24/2010 Document Revised: 10/25/2013 Document Reviewed: 05/12/2013 [facility] Patient Information 2015 [place], [place]. This information is not intended to replace advice given to you by your health care provider. Make sure you discuss any questions you have with your health care provider.

## 2014-09-01 NOTE — Assessment & Plan Note (Signed)
Lab Results  Component Value Date   HGBA1C 6.3 05/24/2014   Diet controlled diabetes by A1c, dx 09/2011 improved from 6.23 September 2011 with weight loss and improved diet/exercise recheck now Check a1c q6-12 mo to montor same The patient is asked to make an attempt to improve diet and exercise patterns to aid in medical management of this problem.

## 2014-09-01 NOTE — Progress Notes (Signed)
Subjective:    Patient ID: [name], female    DOB: 1940-09-25, 74 y.o.   MRN: [id number]  HPI   Here for medicare wellness  Diet: heart healthy, diabetic Physical activity: sedentary Depression/mood screen: negative Hearing: intact to whispered voice Visual acuity: grossly normal, performs annual eye exam  ADLs: capable Fall risk: none Home safety: good Cognitive evaluation: intact to orientation, naming, recall and repetition EOL planning: adv directives, full code/ I agree  I have personally reviewed and have noted 1. The patient's medical and social history 2. Their use of alcohol, tobacco or illicit drugs 3. Their current medications and supplements 4. The patient's functional ability including ADL's, fall risks, home safety risks and hearing or visual impairment. 5. Diet and physical activities 6. Evidence for depression or mood disorders  Also reviewed chronic medical issues and interval events No recurrence of severe pain symptoms as eval by [facility] 2/24 but took >1 wk to resolve - [place] negative - ?cardiac   Past Medical History  Diagnosis Date  . Diverticulosis   . Allergic rhinitis   . Arthritis   . Asthma   . Diet-controlled type 2 diabetes mellitus 09/2011 dx  . Recurrent kidney stones   . Dyslipidemia   . Macular degeneration     genetic etiology, not age-related per retinal specailist   Family History  Problem Relation Age of Onset  . Heart disease Father   . Hyperlipidemia Father   . Hypertension Father   . Colon cancer Mother   . Arthritis Mother   . Cancer Mother     colon cancer  . Diabetes Brother   . Diabetes Paternal Aunt    History  Substance Use Topics  . Smoking status: Never Smoker   . 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[place] but in [place] since 1988  . Alcohol Use: 1.8 oz/week    3 Glasses of wine per week    Review of Systems  Constitutional:  Negative for fatigue and unexpected weight change.  Respiratory: Positive for chest tightness (none in past 2-3 wks since eval for severe RUQ/epigastric and posterior chest pain). Negative for cough, shortness of breath and wheezing.   Cardiovascular: Negative for chest pain, palpitations and leg swelling.  Gastrointestinal: Negative for nausea, abdominal pain, diarrhea and constipation.  Genitourinary: Positive for dysuria, urgency, frequency and vaginal discharge (itch x 1 week).  Neurological: Negative for dizziness, weakness, light-headedness and headaches.  Psychiatric/Behavioral: Negative for dysphoric mood. The patient is not nervous/anxious.   All other systems reviewed and are negative.      Objective:    Physical Exam  Constitutional: She is oriented to person, place, and time. She appears well-developed and well-nourished. No distress.  Cardiovascular: Normal rate, regular rhythm and normal heart sounds.   No murmur heard. Pulmonary/Chest: Effort normal and breath sounds normal. No respiratory distress. She has no wheezes.  Abdominal: Soft. Bowel sounds are normal. She exhibits no distension. There is no tenderness. There is no rebound and no guarding.  Genitourinary:  defer  Musculoskeletal: She exhibits no edema.  Neurological: She is alert and oriented to person, place, and time. She has normal reflexes.  Skin: Skin is warm and dry. No erythema.  Psychiatric: She has a normal mood and affect. Her behavior is normal. Judgment and thought content normal.    BP 114/70 mmHg  Pulse 61  Temp(Src) 98.1 F (36.7 C) (Oral)  Ht 5\' 4"  (1.626 m)  Wt 142 lb 8 oz (64.638 kg)  BMI 24.45 kg/m2  SpO2 97% Wt Readings from Last 3 Encounters:  09/01/14 142 lb 8 oz (64.638 kg)  08/17/14 143 lb 1.9 oz (64.919 kg)  05/24/14 142 lb 8 oz (64.638 kg)     Lab Results  Component Value Date   WBC 7.3 08/17/2014   HGB 14.0 08/17/2014   HCT 41.2 08/17/2014   PLT 268.0 08/17/2014    GLUCOSE 98 03/06/2013   CHOL 204* 05/24/2014   TRIG 159.0* 05/24/2014   HDL 39.10 05/24/2014   LDLDIRECT 149.9 02/25/2013   LDLCALC 133* 05/24/2014   ALT 13 08/17/2014   AST 14 08/17/2014   NA 138 03/06/2013   K 4.2 03/06/2013   CL 105 03/06/2013   CREATININE 0.80 03/06/2013   BUN 17 03/06/2013   CO2 23 03/06/2013   TSH 1.35 08/18/2012   INR 1.0 01/24/2009   HGBA1C 6.3 05/24/2014   MICROALBUR 1.1 09/28/2012    US Abdomen Complete  08/22/2014   CLINICAL DATA:  Right upper quadrant pain.  EXAM: ULTRASOUND ABDOMEN COMPLETE  COMPARISON:  None.  FINDINGS: Gallbladder: No gallstones or wall thickening visualized. No sonographic Murphy sign noted.  Common bile duct: Diameter: 3.7 mm.  Liver: 8 mm simple cyst left lobe.  IVC: No abnormality visualized.  Pancreas: Visualized portion unremarkable.  Spleen: Size and appearance within normal limits.  Right Kidney: Length: 10.2 cm. Echogenicity within normal limits. No mass or hydronephrosis visualized.  Left Kidney: Length: Left 0.2 cm. Echogenicity within normal limits. No mass or hydronephrosis visualized.  Abdominal aorta: No aneurysm visualized.  Other findings: None.  IMPRESSION: No significant abnormality identified.   Electronically Signed   By: Marcello Moores  Register   On: 08/22/2014 12:09   ECG today: sinus @ 64 - RAE - unchanged from 2014     Assessment & Plan:   AWV/z00.01 - Today patient counseled on age appropriate routine health concerns for screening and prevention, each reviewed and up to date or declined. Immunizations reviewed and up to date or declined. Labs/ECG reviewed. Risk factors for depression reviewed and negative. Hearing function and visual acuity are intact. ADLs screened and addressed as needed. Functional ability and level of safety reviewed and appropriate. Education, counseling and referrals performed based on assessed risks today. Patient provided with a copy of personalized plan for preventive services.  Atypical chest  pain - eval for bilary symptoms negative - rule out cardiac condition given numerous risk factors for same - ECG today reviewed (no acute); refer for nuc stress test. recommended ASA 81qd  Dysuria - check UA and erx for empiric candida infx symptoms given hx same - Diflucan  Problem List Items Addressed This Visit    Diet-controlled type 2 diabetes mellitus    Lab Results  Component Value Date   HGBA1C 6.3 05/24/2014   Diet controlled diabetes by A1c, dx 09/2011 improved from 6.23 September 2011 with weight loss and improved diet/exercise recheck now Check a1c q6-12 mo to montor same The patient is asked to make an attempt to improve diet and exercise patterns to aid in medical management of this problem.      Relevant Medications   aspirin EC tablet   Other Relevant Orders   Hemoglobin A1c   Microalbumin / creatinine urine ratio   Myocardial Perfusion Imaging   Basic metabolic panel   Dyslipidemia    Prescribed atorva fall 2015 due to FLP and FH CAD Pt declines  statin due to concerns for potential side effects Will update intolerance allg list to reflect same      Relevant Orders   Lipid panel   Myocardial Perfusion Imaging    Other Visit Diagnoses    Routine general medical examination at a health care facility    -  Primary    Dysuria        Relevant Orders    Urinalysis, Routine w reflex microscopic    Atypical chest pain        Relevant Orders    Myocardial Perfusion Imaging    TSH    EKG 12-Lead        Gwendolyn Grant, MD

## 2014-09-01 NOTE — Assessment & Plan Note (Signed)
Prescribed atorva fall 2015 due to FLP and FH CAD Pt declines statin due to concerns for potential side effects Will update intolerance allg list to reflect same

## 2014-09-01 NOTE — Progress Notes (Signed)
Pre visit review using our clinic review tool, if applicable. No additional management support is needed unless otherwise documented below in the visit note. 

## 2014-09-07 ENCOUNTER — Encounter: Payer: Self-pay | Admitting: Internal Medicine

## 2014-09-07 DIAGNOSIS — H9190 Unspecified hearing loss, unspecified ear: Secondary | ICD-10-CM

## 2014-09-07 NOTE — Telephone Encounter (Signed)
Please check with [facility] on status of stress test referral ordered 3/10 - then let pt know same thanks

## 2014-09-08 NOTE — Telephone Encounter (Signed)
Gave msg to [facility] to check status on stress test.../lmb

## 2014-09-26 ENCOUNTER — Ambulatory Visit (HOSPITAL_COMMUNITY): Payer: Medicare PPO | Attending: Cardiology | Admitting: Radiology

## 2014-09-26 DIAGNOSIS — E119 Type 2 diabetes mellitus without complications: Secondary | ICD-10-CM | POA: Insufficient documentation

## 2014-09-26 DIAGNOSIS — R0789 Other chest pain: Secondary | ICD-10-CM | POA: Insufficient documentation

## 2014-09-26 DIAGNOSIS — E785 Hyperlipidemia, unspecified: Secondary | ICD-10-CM | POA: Diagnosis not present

## 2014-09-26 MED ORDER — TECHNETIUM TC 99M SESTAMIBI GENERIC - CARDIOLITE
33.0000 | Freq: Once | INTRAVENOUS | Status: AC | PRN
  Administered 2014-09-26: 33 via INTRAVENOUS

## 2014-09-26 MED ORDER — TECHNETIUM TC 99M SESTAMIBI GENERIC - CARDIOLITE
11.0000 | Freq: Once | INTRAVENOUS | Status: AC | PRN
  Administered 2014-09-26: 11 via INTRAVENOUS

## 2014-09-26 NOTE — Progress Notes (Signed)
[facility] [address] [place], [place] [address] [phone]    Cardiology Nuclear Med Study  Erica [name] is a 74 y.o. female     MRN : [id number]     DOB: 1941-06-13  Procedure Date: 09/26/2014  Nuclear Med Background Indication for Stress Test:  Evaluation for Ischemia History:  No known CAD Cardiac Risk Factors: NIDDM  Symptoms:  Chest Pain (last date of chest discomfort was July 30, 2014)   Nuclear Pre-Procedure Caffeine/Decaff Intake:  8:00pm NPO After: 8:30pm   Lungs:  clear O2 Sat: 96% on room air. IV 0.9% NS with Angio Cath:  22g  IV Site: R Hand  IV Started by:  Matilde Haymaker, RN  Chest Size (in):  34 Cup Size: B  Height: 5\' 4"  (1.626 m)  Weight:  143 lb (64.864 kg)  BMI:  Body mass index is 24.53 kg/(m^2). Tech Comments:  n/a    Nuclear Med Study 1 or 2 day study: 1 day  Stress Test Type:  Stress  Reading MD: n/a  Order Authorizing Provider:  Leone Haven  Resting Radionuclide: Technetium 18m Sestamibi  Resting Radionuclide Dose: 11.0 mCi   Stress Radionuclide:  Technetium 2m Sestamibi  Stress Radionuclide Dose: 33.0 mCi           Stress Protocol Rest HR: 60 Stress HR: 137  Rest BP: 143/73 Stress BP: 196/65  Exercise Time (min): 6:00 METS: 7.0   Predicted Max HR: 144 bpm % Max HR: 95.14 bpm Rate Pressure Product: 41962   Dose of Adenosine (mg):  n/a Dose of Lexiscan: n/a mg  Dose of Atropine (mg): n/a Dose of Dobutamine: n/a mcg/kg/min (at max HR)  Stress Test Technologist: Glade Lloyd, BS-ES  Nuclear Technologist:  Earl Many, CNMT     Rest Procedure:  Myocardial perfusion imaging was performed at rest 45 minutes following the intravenous administration of Technetium 35m Sestamibi. Rest ECG: NSR - Normal EKG  Stress Procedure:  The patient exercised on the treadmill utilizing the Bruce Protocol for 6:00 minutes. The patient stopped due to fatigue and had right sided clavicle pain that  radiated into her shoulder blades.  This pain went away about six minutes into exercise.  Technetium 97m Sestamibi was injected at peak exercise and myocardial perfusion imaging was performed after a brief delay.  Stress ECG: No significant change from baseline ECG  QPS Raw Data Images:  Normal; no motion artifact; normal heart/lung ratio. Stress Images:  Normal homogeneous uptake in all areas of the myocardium. Rest Images:  Normal homogeneous uptake in all areas of the myocardium. Subtraction (SDS):  No evidence of ischemia. Transient Ischemic Dilatation (Normal <1.22):  0.99 Lung/Heart Ratio (Normal <0.45):  0.28  Quantitative Gated Spect Images QGS EDV:  55 ml QGS ESV:  17 ml  Impression Exercise Capacity:  Good exercise capacity. BP Response:  Normal blood pressure response. Clinical Symptoms:  Right clavicular pain. ECG Impression:  No significant ST segment change suggestive of ischemia. Comparison with Prior Nuclear Study: No previous nuclear study performed  Overall Impression:  Normal stress nuclear study.  LV Ejection Fraction: 69%.  LV Wall Motion:  NL LV Function; NL Wall Motion  Darlin Coco MD

## 2014-12-09 DIAGNOSIS — H3531 Nonexudative age-related macular degeneration: Secondary | ICD-10-CM | POA: Diagnosis not present

## 2014-12-09 DIAGNOSIS — H25813 Combined forms of age-related cataract, bilateral: Secondary | ICD-10-CM | POA: Diagnosis not present

## 2014-12-09 DIAGNOSIS — H40053 Ocular hypertension, bilateral: Secondary | ICD-10-CM | POA: Diagnosis not present

## 2014-12-28 LAB — HM DIABETES EYE EXAM

## 2014-12-29 DIAGNOSIS — H3554 Dystrophies primarily involving the retinal pigment epithelium: Secondary | ICD-10-CM | POA: Diagnosis not present

## 2015-01-12 ENCOUNTER — Encounter: Payer: Self-pay | Admitting: Internal Medicine

## 2015-01-23 HISTORY — PX: CATARACT EXTRACTION: SUR2

## 2015-01-30 DIAGNOSIS — H2512 Age-related nuclear cataract, left eye: Secondary | ICD-10-CM | POA: Diagnosis not present

## 2015-01-30 DIAGNOSIS — H25812 Combined forms of age-related cataract, left eye: Secondary | ICD-10-CM | POA: Diagnosis not present

## 2015-02-05 ENCOUNTER — Encounter: Payer: Self-pay | Admitting: Internal Medicine

## 2015-02-07 DIAGNOSIS — H2511 Age-related nuclear cataract, right eye: Secondary | ICD-10-CM | POA: Diagnosis not present

## 2015-02-13 DIAGNOSIS — H25811 Combined forms of age-related cataract, right eye: Secondary | ICD-10-CM | POA: Diagnosis not present

## 2015-02-13 DIAGNOSIS — H2511 Age-related nuclear cataract, right eye: Secondary | ICD-10-CM | POA: Diagnosis not present

## 2015-02-14 DIAGNOSIS — H02052 Trichiasis without entropian right lower eyelid: Secondary | ICD-10-CM | POA: Diagnosis not present

## 2015-02-14 DIAGNOSIS — Z961 Presence of intraocular lens: Secondary | ICD-10-CM | POA: Diagnosis not present

## 2015-02-16 ENCOUNTER — Other Ambulatory Visit (INDEPENDENT_AMBULATORY_CARE_PROVIDER_SITE_OTHER): Payer: Medicare PPO

## 2015-02-16 ENCOUNTER — Ambulatory Visit (INDEPENDENT_AMBULATORY_CARE_PROVIDER_SITE_OTHER): Payer: Medicare PPO | Admitting: Family Medicine

## 2015-02-16 ENCOUNTER — Encounter: Payer: Self-pay | Admitting: Family Medicine

## 2015-02-16 ENCOUNTER — Ambulatory Visit (INDEPENDENT_AMBULATORY_CARE_PROVIDER_SITE_OTHER)
Admission: RE | Admit: 2015-02-16 | Discharge: 2015-02-16 | Disposition: A | Discharge status: Home / Self Care | Payer: Medicare PPO | Source: Ambulatory Visit | Attending: Family Medicine | Admitting: Family Medicine

## 2015-02-16 VITALS — BP 128/72 | HR 87 | Ht 64.0 in | Wt 142.0 lb

## 2015-02-16 DIAGNOSIS — S92355A Nondisplaced fracture of fifth metatarsal bone, left foot, initial encounter for closed fracture: Secondary | ICD-10-CM | POA: Insufficient documentation

## 2015-02-16 DIAGNOSIS — M79672 Pain in left foot: Secondary | ICD-10-CM

## 2015-02-16 DIAGNOSIS — M7732 Calcaneal spur, left foot: Secondary | ICD-10-CM | POA: Diagnosis not present

## 2015-02-16 NOTE — Progress Notes (Signed)
Pre visit review using our clinic review tool, if applicable. No additional management support is needed unless otherwise documented below in the visit note. 

## 2015-02-16 NOTE — Assessment & Plan Note (Signed)
I believe the patient does have more of a fifth metatarsal fracture. We discussed putting patient in a rigid shoe, postop. Patient will wear this and avoid being barefoot. We'll do icing, topical anti-inflammatory's, and we'll get x-rays to rule out any other bony abnormality that could be contribute in. I do think that this is not to take somewhere between 3 and 12 weeks to heal appropriately. Patient come back in 3 weeks for further evaluation and treatment.

## 2015-02-16 NOTE — Patient Instructions (Signed)
Nice to meet you Xrays downstairs today on your foot Wear the walking shoe during the day as much as you can, don't go barefoot  Add Vitamin D 4000mg /day Ice your ankle, not the area of your foot that is painful, twice/day - after activity and before bed  Try Pennsaid, topical anti-inflammatory, to the sore area 2x/day  See me again in 3 weeks

## 2015-02-16 NOTE — Progress Notes (Signed)
  [facility] [address], [place] [address] Phone: [phone] Subjective:    I'm seeing this patient by the request  of:  Gwendolyn Grant, MD   CC: Left foot pain  [id number] Erica [name] is a 74 y.o. female coming in with complaint of left foot pain.. Patient states over the course of the summer she started having pain on the lateral aspect of her foot. Does not remember any true injury. Does have a history of osteopenia. Patient states that in fortunate is starting to even affect her daily activities. She is no longer able to dance which is one of her favor pastimes. States that he can be throbbing at night now. Wondering what is going on. Denies any fevers chills or any abnormal weight loss. Rates the severity of pain a 6 out of 10. All aching with the sharp pain with certain movements.    Past medical history, social, surgical and family history all reviewed in electronic medical record.   Review of Systems: No headache, visual changes, nausea, vomiting, diarrhea, constipation, dizziness, abdominal pain, skin rash, fevers, chills, night sweats, weight loss, swollen lymph nodes, body aches, joint swelling, muscle aches, chest pain, shortness of breath, mood changes.   Objective Blood pressure 128/72, pulse 87, height 5\' 4"  (1.626 m), weight 142 lb (64.411 kg), SpO2 98 %.  General: No apparent distress alert and oriented x3 mood and affect normal, dressed appropriately.  HEENT: Pupils equal, extraocular movements intact  Respiratory: Patient's speak in full sentences and does not appear short of breath  Cardiovascular: No lower extremity edema, non tender, no erythema  Skin: Warm dry intact with no signs of infection or rash on extremities or on axial skeleton.  Abdomen: Soft nontender  Neuro: Cranial nerves II through XII are intact, neurovascularly intact in all extremities with 2+ DTRs and 2+ pulses.  Lymph: No lymphadenopathy of  posterior or anterior cervical chain or axillae bilaterally.  Gait normal with good balance and coordination.  MSK:  Non tender with full range of motion and good stability and symmetric strength and tone of shoulders, elbows, wrist, hip, nd ankles bilaterally.     Left foot shows patient does have an area of swelling over the proximal fifth metatarsal. Severely tender to palpation. Neurovascular intact distally. Full range of motion of the ankle.  Limited muscular skeletal ultrasound was performed and interpreted by Hulan Saas, M Limited ultrasound shows the patient with metatarsal does have significant hypoechoic changes and what appears to be either arthritic changes bursitis posttraumatic only abnormality of the fifth metatarsal. Impression: Arthritis of the fifth metatarsal, osteopenia, overlying swelling.   Impression and Recommendations:     This case required medical decision making of moderate complexity.

## 2015-02-17 ENCOUNTER — Encounter: Payer: Self-pay | Admitting: Family Medicine

## 2015-03-01 ENCOUNTER — Encounter: Payer: Self-pay | Admitting: Internal Medicine

## 2015-03-02 NOTE — Telephone Encounter (Signed)
Please recheck to patient to reschedule her appointment with me next week for appointment this week with someone

## 2015-03-03 NOTE — Telephone Encounter (Signed)
Spoke to pt and pt wants to stick to the Tuesday appt. Pt stated that she was feeling better.

## 2015-03-07 ENCOUNTER — Encounter: Payer: Self-pay | Admitting: Internal Medicine

## 2015-03-07 ENCOUNTER — Ambulatory Visit (INDEPENDENT_AMBULATORY_CARE_PROVIDER_SITE_OTHER): Payer: Medicare PPO | Admitting: Family Medicine

## 2015-03-07 ENCOUNTER — Other Ambulatory Visit (INDEPENDENT_AMBULATORY_CARE_PROVIDER_SITE_OTHER): Payer: Medicare PPO

## 2015-03-07 ENCOUNTER — Encounter: Payer: Self-pay | Admitting: Family Medicine

## 2015-03-07 ENCOUNTER — Ambulatory Visit (INDEPENDENT_AMBULATORY_CARE_PROVIDER_SITE_OTHER): Payer: Medicare PPO | Admitting: Internal Medicine

## 2015-03-07 VITALS — BP 110/68 | HR 67 | Ht 64.0 in | Wt 142.0 lb

## 2015-03-07 VITALS — BP 118/64 | HR 61 | Temp 97.4°F | Ht 64.0 in | Wt 143.5 lb

## 2015-03-07 DIAGNOSIS — F4323 Adjustment disorder with mixed anxiety and depressed mood: Secondary | ICD-10-CM | POA: Diagnosis not present

## 2015-03-07 DIAGNOSIS — R5383 Other fatigue: Secondary | ICD-10-CM

## 2015-03-07 DIAGNOSIS — M79672 Pain in left foot: Secondary | ICD-10-CM

## 2015-03-07 DIAGNOSIS — E119 Type 2 diabetes mellitus without complications: Secondary | ICD-10-CM | POA: Diagnosis not present

## 2015-03-07 DIAGNOSIS — S92355D Nondisplaced fracture of fifth metatarsal bone, left foot, subsequent encounter for fracture with routine healing: Secondary | ICD-10-CM

## 2015-03-07 LAB — CBC WITH DIFFERENTIAL/PLATELET
Basophils Absolute: 0.1 10*3/uL (ref 0.0–0.1)
Basophils Relative: 0.9 % (ref 0.0–3.0)
Eosinophils Absolute: 0.3 10*3/uL (ref 0.0–0.7)
Eosinophils Relative: 3 % (ref 0.0–5.0)
HCT: 43.2 % (ref 36.0–46.0)
Hemoglobin: 14.5 g/dL (ref 12.0–15.0)
Lymphocytes Relative: 34.2 % (ref 12.0–46.0)
Lymphs Abs: 2.9 10*3/uL (ref 0.7–4.0)
MCHC: 33.7 g/dL (ref 30.0–36.0)
MCV: 89.8 fl (ref 78.0–100.0)
Monocytes Absolute: 0.6 10*3/uL (ref 0.1–1.0)
Monocytes Relative: 6.6 % (ref 3.0–12.0)
Neutro Abs: 4.6 10*3/uL (ref 1.4–7.7)
Neutrophils Relative %: 55.3 % (ref 43.0–77.0)
Platelets: 298 10*3/uL (ref 150.0–400.0)
RBC: 4.8 Mil/uL (ref 3.87–5.11)
RDW: 13.6 % (ref 11.5–15.5)
WBC: 8.4 10*3/uL (ref 4.0–10.5)

## 2015-03-07 LAB — BASIC METABOLIC PANEL
BUN: 14 mg/dL (ref 6–23)
CO2: 27 mEq/L (ref 19–32)
Calcium: 9.4 mg/dL (ref 8.4–10.5)
Chloride: 104 mEq/L (ref 96–112)
Creatinine, Ser: 0.73 mg/dL (ref 0.40–1.20)
GFR: 82.26 mL/min (ref >60.00)
Glucose, Bld: 93 mg/dL (ref 70–99)
Potassium: 4.8 mEq/L (ref 3.5–5.1)
Sodium: 138 mEq/L (ref 135–145)

## 2015-03-07 LAB — HEPATIC FUNCTION PANEL
ALT: 18 U/L (ref 0–35)
AST: 15 U/L (ref 0–37)
Albumin: 4.1 g/dL (ref 3.5–5.2)
Alkaline Phosphatase: 57 U/L (ref 39–117)
Bilirubin, Direct: 0.1 mg/dL (ref 0.0–0.3)
Total Bilirubin: 0.4 mg/dL (ref 0.2–1.2)
Total Protein: 7.1 g/dL (ref 6.0–8.3)

## 2015-03-07 LAB — TSH: TSH: 2.47 u[IU]/mL (ref 0.35–4.50)

## 2015-03-07 LAB — HEMOGLOBIN A1C: Hgb A1c MFr Bld: 6 % (ref 4.6–6.5)

## 2015-03-07 MED ORDER — CYANOCOBALAMIN 1000 MCG/ML IJ SOLN: 1000.0000 ug | INTRAMUSCULAR | Status: DC

## 2015-03-07 MED ORDER — ESCITALOPRAM OXALATE 5 MG PO TABS: 5.0000 mg | ORAL_TABLET | Freq: Every day | ORAL | Status: DC

## 2015-03-07 NOTE — Patient Instructions (Addendum)
It was good to see you today.  Your annual flu shot was given and/or updated today.  We have reviewed your prior records including labs and tests today  Test(s) ordered today. Your results will be released to [place] (or called to you) after review, usually within 72hours after test completion. If any changes need to be made, you will be notified at that same time.  Medications reviewed and updated Start low dose Lexapro - Your prescription(s) have been submitted to your pharmacy. Please take as directed and contact our office if you believe you are having problem(s) with the medication(s). No other changes recommended at this time.  Please schedule followup in 6 weeks, call sooner if problems.

## 2015-03-07 NOTE — Progress Notes (Signed)
Pre visit review using our clinic review tool, if applicable. No additional management support is needed unless otherwise documented below in the visit note. 

## 2015-03-07 NOTE — Assessment & Plan Note (Signed)
Adjustment disorder. Multiple medical issues recently contributing to generalized nausea, anxiety and depression. If labs okay, patient agrees to begin low-dose SSRI therapy - titrate as needed based on symptoms and response. patient denies SI/HI. Support offered at length

## 2015-03-07 NOTE — Progress Notes (Signed)
  [facility] [address], [place] [address] Phone: [phone] Subjective:    I'm seeing this patient by the request  of:  Gwendolyn Grant, MD   CC: Left foot pain  [id number] Erica [name] is a 74 y.o. female coming in with complaint of left foot pain.. Patient was seen previously and did have a fifth metatarsal fracture with osteophytic changes. Patient has been in the postop boot since last visit. Patient states that she is feeling much better.'s significant decrease in swelling. Still some mild tenderness on that side after walking long distances. Has been avoiding walking barefoot. When she does walk barefoot she does have pain though. Denies any new symptoms such as numbness or weakness. Still able to do daily activities.    Past medical history, social, surgical and family history all reviewed in electronic medical record.   Review of Systems: No headache, visual changes, nausea, vomiting, diarrhea, constipation, dizziness, abdominal pain, skin rash, fevers, chills, night sweats, weight loss, swollen lymph nodes, body aches, joint swelling, muscle aches, chest pain, shortness of breath, mood changes.   Objective Blood pressure 110/68, pulse 67, height 5\' 4"  (1.626 m), weight 142 lb (64.411 kg), SpO2 95 %.  General: No apparent distress alert and oriented x3 mood and affect normal, dressed appropriately.  HEENT: Pupils equal, extraocular movements intact  Respiratory: Patient's speak in full sentences and does not appear short of breath  Cardiovascular: No lower extremity edema, non tender, no erythema  Skin: Warm dry intact with no signs of infection or rash on extremities or on axial skeleton.  Abdomen: Soft nontender  Neuro: Cranial nerves II through XII are intact, neurovascularly intact in all extremities with 2+ DTRs and 2+ pulses.  Lymph: No lymphadenopathy of posterior or anterior cervical chain or axillae bilaterally.  Gait  normal with good balance and coordination.  MSK:  Non tender with full range of motion and good stability and symmetric strength and tone of shoulders, elbows, wrist, hip, and ankles bilaterally.     Left foot shows patient does have resolution of swelling from previous exam. tender to palpation still over the proximal fifth metatarsal. Neurovascular intact distally. Full range of motion of the ankle.  Limited muscular skeletal ultrasound was performed and interpreted by Hulan Saas, M Limited ultrasound shows the patient with metatarsal does have significant hypoechoic changes and what appears to be either arthritic changes bursitis posttraumatic only abnormality of the fifth metatarsal. Impression: Improvement in swelling with callus formation noted over the fifth metatarsal.   Impression and Recommendations:     This case required medical decision making of moderate complexity.

## 2015-03-07 NOTE — Assessment & Plan Note (Signed)
Callus formation noted on ultrasound today. We discussed icing regimen and home exercises. Patient can start walking in a rigid shoe other than the Cam Walker over the course the next 2-3 weeks. We discussed if any worsening pain to go back into the boot. Patient will come back and see me again in 3 weeks for further evaluation. Patient knows that this can take a total of 12 weeks to fully heal.

## 2015-03-07 NOTE — Progress Notes (Signed)
Subjective:    Patient ID: Erica [name], female    DOB: 04/15/1941, 74 y.o.   MRN: [id number]  HPI  Patient here for follow up - Also reviewed chronic medical conditions, interval events and current concerns  Past Medical History  Diagnosis Date  . Diverticulosis   . Allergic rhinitis   . Arthritis   . Asthma   . Diet-controlled type 2 diabetes mellitus 09/2011 dx  . Recurrent kidney stones   . Dyslipidemia   . Macular degeneration     genetic etiology, not age-related per retinal specailist    Review of Systems  Constitutional: Positive for fatigue (x 5 weeks ago since 1st cataract surg, worse following 2nd surg two weeks later).  Eyes: Negative for photophobia, pain, discharge, redness, itching and visual disturbance.  Respiratory: Positive for shortness of breath (episodes, lasting few minutes-hour). Negative for cough.   Cardiovascular: Positive for palpitations (episodic, not related to activity or position). Negative for chest pain and leg swelling.  Gastrointestinal: Positive for nausea. Negative for vomiting, abdominal pain, diarrhea, constipation and blood in stool.  Neurological: Positive for dizziness (episodic), light-headedness and headaches. Negative for speech difficulty, weakness and numbness.  Psychiatric/Behavioral: Positive for dysphoric mood. Negative for suicidal ideas, sleep disturbance and self-injury. The patient is not nervous/anxious.        Objective:    Physical Exam  Constitutional: She appears well-developed and well-nourished. No distress.  Cardiovascular: Normal rate, regular rhythm and normal heart sounds.   No murmur heard. Pulmonary/Chest: Effort normal and breath sounds normal. No respiratory distress.  Musculoskeletal: She exhibits no edema.  Psychiatric:  Slightly dysphoric and tearful tendency    BP 118/64 mmHg  Pulse 61  Temp(Src) 97.4 F (36.3 C) (Oral)  Ht 5\' 4"  (1.626 m)  Wt 143 lb 8 oz (65.091 kg)  BMI 24.62 kg/m2   SpO2 96% Wt Readings from Last 3 Encounters:  03/07/15 143 lb 8 oz (65.091 kg)  03/07/15 142 lb (64.411 kg)  02/16/15 142 lb (64.411 kg)    Lab Results  Component Value Date   WBC 7.3 08/17/2014   HGB 14.0 08/17/2014   HCT 41.2 08/17/2014   PLT 268.0 08/17/2014   GLUCOSE 101* 09/01/2014   CHOL 186 09/01/2014   TRIG 99.0 09/01/2014   HDL 38.30* 09/01/2014   LDLDIRECT 149.9 02/25/2013   LDLCALC 128* 09/01/2014   ALT 13 08/17/2014   AST 14 08/17/2014   NA 139 09/01/2014   K 4.3 09/01/2014   CL 106 09/01/2014   CREATININE 0.83 09/01/2014   BUN 15 09/01/2014   CO2 29 09/01/2014   TSH 1.93 09/01/2014   INR 1.0 01/24/2009   HGBA1C 6.1 09/01/2014   MICROALBUR <0.7 09/01/2014    [place] Extrem Low Left Ltd  02/16/2015   Limited muscular skeletal ultrasound was performed and interpreted by  Hulan Saas, M Limited ultrasound shows the patient with metatarsal does have significant  hypoechoic changes and what appears to be either arthritic changes  bursitis posttraumatic only abnormality of the fifth metatarsal. Impression: Arthritis of the fifth metatarsal, osteopenia, overlying  swelling.  Dg Foot Complete Left  02/16/2015   CLINICAL DATA:  Lateral foot pain for 2 months, no known injury  EXAM: LEFT FOOT - COMPLETE 3+ VIEW  COMPARISON:  None.  FINDINGS: Three views of the left foot submitted. No acute fracture or subluxation. Tiny posterior spur of calcaneus.  IMPRESSION: No acute fracture or subluxation.  Tiny posterior spur of calcaneus.   Electronically Signed  By: Lahoma Crocker M.D.   On: 02/16/2015 12:24       Assessment & Plan:   Fatigue - nonspecific symptoms/exam - check screening labs   Problem List Items Addressed This Visit    Adjustment disorder with mixed anxiety and depressed mood    Adjustment disorder. Multiple medical issues recently contributing to generalized nausea, anxiety and depression. If labs okay, patient agrees to begin low-dose SSRI therapy - titrate as  needed based on symptoms and response. patient denies SI/HI. Support offered at length      Diet-controlled type 2 diabetes mellitus    Lab Results  Component Value Date   HGBA1C 6.1 09/01/2014   Diet controlled diabetes by A1c, dx 09/2011 improved from 6.23 September 2011 with weight loss and improved diet/exercise recheck now Check a1c q6-12 mo to montor same The patient is asked to make an attempt to improve diet and exercise patterns to aid in medical management of this problem.       Other Visit Diagnoses    Other fatigue    -  Primary    Relevant Orders    Basic metabolic panel    CBC with Differential/Platelet    Hepatic function panel    TSH    Diet-controlled diabetes mellitus        Relevant Orders    Hemoglobin A1c        Gwendolyn Grant, MD

## 2015-03-07 NOTE — Patient Instructions (Signed)
Good to see you Ice still when you need it Continue the vitamin D 2000 IU daily OK to switch to shoes for little activities and in the house for next 2-3 weeks then shoes all the time Rigid bottom See me again in 4 weeks.

## 2015-03-07 NOTE — Assessment & Plan Note (Signed)
Lab Results  Component Value Date   HGBA1C 6.1 09/01/2014   Diet controlled diabetes by A1c, dx 09/2011 improved from 6.23 September 2011 with weight loss and improved diet/exercise recheck now Check a1c q6-12 mo to montor same The patient is asked to make an attempt to improve diet and exercise patterns to aid in medical management of this problem.

## 2015-03-08 ENCOUNTER — Telehealth: Payer: Self-pay

## 2015-03-08 NOTE — Telephone Encounter (Signed)
PCP requested that I contact the pt and have her come back in for a ROV in 6 weeks.  Pt contacted and appt scheduled.

## 2015-03-15 DIAGNOSIS — Z961 Presence of intraocular lens: Secondary | ICD-10-CM | POA: Diagnosis not present

## 2015-04-04 ENCOUNTER — Encounter: Payer: Self-pay | Admitting: Family Medicine

## 2015-04-04 ENCOUNTER — Ambulatory Visit (INDEPENDENT_AMBULATORY_CARE_PROVIDER_SITE_OTHER): Payer: Medicare PPO | Admitting: Family Medicine

## 2015-04-04 VITALS — BP 122/64 | HR 83 | Ht 64.0 in | Wt 143.0 lb

## 2015-04-04 DIAGNOSIS — S92355D Nondisplaced fracture of fifth metatarsal bone, left foot, subsequent encounter for fracture with routine healing: Secondary | ICD-10-CM | POA: Diagnosis not present

## 2015-04-04 NOTE — Assessment & Plan Note (Signed)
Patient overall seems to be healed but will have some post-arthritic changes of this joint. We discussed than what treatments to doing home modalities to do if any exacerbation occurs. If patient has any worsening symptoms proper shoes as though is going to be beneficial. We can always try injection if necessary Evaro area down. Patient does long she does well follow-up as needed.

## 2015-04-04 NOTE — Progress Notes (Signed)
Pre visit review using our clinic review tool, if applicable. No additional management support is needed unless otherwise documented below in the visit note. 

## 2015-04-04 NOTE — Progress Notes (Signed)
  [name] [address], [place] [address] Phone: [phone] Subjective:    I'm seeing this patient by the request  of:  Gwendolyn Grant, MD   CC: Left foot pain  [id number] Erica [name] is a 74 y.o. female coming in with complaint of left foot pain.. Patient was seen previously and did have a fifth metatarsal fracture with  underlying arthritic changes was well. Patient was in a postop boot and has transitioned into a shoe. Patient was to continue the icing as well as the topical anti-inflammatories in the high-dose vitamin D supplementation. Patient states overall she is doing approximately 75-85% better. Still mild discomfort especially when trying to rotate on the foot. Patient denies any pain though with sitting. Past medical history, social, surgical and family history all reviewed in electronic medical record.   Review of Systems: No headache, visual changes, nausea, vomiting, diarrhea, constipation, dizziness, abdominal pain, skin rash, fevers, chills, night sweats, weight loss, swollen lymph nodes, body aches, joint swelling, muscle aches, chest pain, shortness of breath, mood changes.   Objective Blood pressure 122/64, pulse 83, height 5\' 4"  (1.626 m), weight 143 lb (64.864 kg), SpO2 98 %.  General: No apparent distress alert and oriented x3 mood and affect normal, dressed appropriately.  HEENT: Pupils equal, extraocular movements intact  Respiratory: Patient's speak in full sentences and does not appear short of breath  Cardiovascular: No lower extremity edema, non tender, no erythema  Skin: Warm dry intact with no signs of infection or rash on extremities or on axial skeleton.  Abdomen: Soft nontender  Neuro: Cranial nerves II through XII are intact, neurovascularly intact in all extremities with 2+ DTRs and 2+ pulses.  Lymph: No lymphadenopathy of posterior or anterior cervical chain or axillae bilaterally.  Gait normal with  good balance and coordination.  MSK:  Non tender with full range of motion and good stability and symmetric strength and tone of shoulders, elbows, wrist, hip, and ankles bilaterally.     Left foot shows patient does have resolution of swelling from previous exam. tender to palpation still over the proximal fifth metatarsal. Neurovascular intact distally. Full range of motion of the ankle. Mild improvement from previous exam.  Limited muscular skeletal ultrasound was performed and interpreted by Hulan Saas, M Limited ultrasound shows the patient with metatarsal fracture is well-healed but severe posttraumatic arthritis noted Impression: Post traumatic arthritis   Impression and Recommendations:     This case required medical decision making of moderate complexity.

## 2015-04-04 NOTE — Patient Instructions (Signed)
Great to see you You are doing great  I think we have reached near the best we will get Tylenol 500mg  3 times daily Ice when acting up Vitamin D 2000 IU daily still but not the calcium See me when you need me or if thumb gets worse.

## 2015-04-18 ENCOUNTER — Encounter: Payer: Self-pay | Admitting: Internal Medicine

## 2015-04-18 ENCOUNTER — Ambulatory Visit (INDEPENDENT_AMBULATORY_CARE_PROVIDER_SITE_OTHER): Payer: Medicare PPO | Admitting: Internal Medicine

## 2015-04-18 VITALS — BP 122/58 | HR 70 | Temp 98.3°F | Ht 64.0 in | Wt 143.5 lb

## 2015-04-18 DIAGNOSIS — Z23 Encounter for immunization: Secondary | ICD-10-CM | POA: Diagnosis not present

## 2015-04-18 DIAGNOSIS — F4323 Adjustment disorder with mixed anxiety and depressed mood: Secondary | ICD-10-CM | POA: Diagnosis not present

## 2015-04-18 NOTE — Progress Notes (Signed)
Subjective:    Patient ID: Erica [name], female    DOB: [birth year], 74 y.o.   MRN: [id number]  HPI  Patient here for follow-up. Persisting nausea, potentially exacerbated by iron but uncertain if related to Lexapro. Did not begin Lexapro for initial 2 weeks but has not taken same and feels somewhat better, specifically less leg "heaviness" with better energy in the morning  Past Medical History  Diagnosis Date  . Diverticulosis   . Allergic rhinitis   . Arthritis   . Asthma   . Diet-controlled type 2 diabetes mellitus ([place]) 09/2011 dx  . Recurrent kidney stones   . Dyslipidemia   . Macular degeneration     genetic etiology, not age-related per retinal specailist    Review of Systems  Constitutional: Positive for fatigue. Negative for unexpected weight change.  Respiratory: Negative for cough and shortness of breath.   Cardiovascular: Negative for leg swelling.  Gastrointestinal: Positive for nausea (after meals, mild -?iron related).       Objective:    Physical Exam  Constitutional: She appears well-developed and well-nourished. No distress.  Cardiovascular: Normal rate, regular rhythm and normal heart sounds.   No murmur heard. Pulmonary/Chest: Effort normal and breath sounds normal. No respiratory distress.  Musculoskeletal: She exhibits no edema.    BP 122/58 mmHg  Pulse 70  Temp(Src) 98.3 F (36.8 C) (Oral)  Ht 5\' 4"  (1.626 m)  Wt 143 lb 8 oz (65.091 kg)  BMI 24.62 kg/m2  SpO2 96% Wt Readings from Last 3 Encounters:  04/18/15 143 lb 8 oz (65.091 kg)  04/04/15 143 lb (64.864 kg)  03/07/15 143 lb 8 oz (65.091 kg)     Lab Results  Component Value Date   WBC 8.4 03/07/2015   HGB 14.5 03/07/2015   HCT 43.2 03/07/2015   PLT 298.0 03/07/2015   GLUCOSE 93 03/07/2015   CHOL 186 09/01/2014   TRIG 99.0 09/01/2014   HDL 38.30* 09/01/2014   LDLDIRECT 149.9 02/25/2013   LDLCALC 128* 09/01/2014   ALT 18 03/07/2015   AST 15 03/07/2015   NA 138 03/07/2015     K 4.8 03/07/2015   CL 104 03/07/2015   CREATININE 0.73 03/07/2015   BUN 14 03/07/2015   CO2 27 03/07/2015   TSH 2.47 03/07/2015   INR 1.0 01/24/2009   HGBA1C 6.0 03/07/2015   MICROALBUR <0.7 09/01/2014    [place] Extrem Low Left Ltd  02/16/2015  Limited muscular skeletal ultrasound was performed and interpreted by Hulan Saas, M Limited ultrasound shows the patient with metatarsal does have significant hypoechoic changes and what appears to be either arthritic changes bursitis posttraumatic only abnormality of the fifth metatarsal. Impression: Arthritis of the fifth metatarsal, osteopenia, overlying swelling.  Dg Foot Complete Left  02/16/2015  CLINICAL DATA:  Lateral foot pain for 2 months, no known injury EXAM: LEFT FOOT - COMPLETE 3+ VIEW COMPARISON:  None. FINDINGS: Three views of the left foot submitted. No acute fracture or subluxation. Tiny posterior spur of calcaneus. IMPRESSION: No acute fracture or subluxation.  Tiny posterior spur of calcaneus. Electronically Signed   By: Lahoma Crocker M.D.   On: 02/16/2015 12:24       Assessment & Plan:   Problem List Items Addressed This Visit    Adjustment disorder with mixed anxiety and depressed mood    Adjustment disorder. Multiple medical issues (including complicated recovery from cataract surgery) recently contributing to generalized nausea, anxiety and depression symptoms  Lab screening unremarkable for metabolic abnormality  so patient agreed to begin low-dose SSRI therapy 02/2015 Did not begin taking same until 2 weeks ago, denies adverse side effects related to same and may feel marginally improved Encourage continuation of same without dose increase at this time. Follow-up in 6 weeks to review symptoms and determine if titration would be beneficial versus discontinuation of same if no change Patient agrees to call if nausea symptoms continue to worsen or other problems related to medication  patient denies SI/HI. Support offered at  length       Other Visit Diagnoses    Need for prophylactic vaccination and inoculation against influenza    -  Primary    Relevant Orders    Flu vaccine HIGH DOSE PF (Fluzone High dose)        Gwendolyn Grant, MD

## 2015-04-18 NOTE — Assessment & Plan Note (Signed)
Adjustment disorder. Multiple medical issues (including complicated recovery from cataract surgery) recently contributing to generalized nausea, anxiety and depression symptoms  Lab screening unremarkable for metabolic abnormality so patient agreed to begin low-dose SSRI therapy 02/2015 Did not begin taking same until 2 weeks ago, denies adverse side effects related to same and may feel marginally improved Encourage continuation of same without dose increase at this time. Follow-up in 6 weeks to review symptoms and determine if titration would be beneficial versus discontinuation of same if no change Patient agrees to call if nausea symptoms continue to worsen or other problems related to medication  patient denies SI/HI. Support offered at length

## 2015-04-18 NOTE — Patient Instructions (Signed)
It was good to see you today.  We have reviewed your prior records including labs and tests today  Medications reviewed and updated - stop iron supplement and continue with daily low dose "lexapro" - no other changes recommended at this time.  If continued or worsenig nausea, please stop Lexapro (and notify our office of same)  Please schedule followup in 6 weeks to review symptoms and medication, call sooner if problems.

## 2015-04-18 NOTE — Progress Notes (Signed)
Pre visit review using our clinic review tool, if applicable. No additional management support is needed unless otherwise documented below in the visit note. 

## 2015-04-23 DIAGNOSIS — R42 Dizziness and giddiness: Secondary | ICD-10-CM | POA: Diagnosis not present

## 2015-04-23 DIAGNOSIS — M542 Cervicalgia: Secondary | ICD-10-CM | POA: Diagnosis not present

## 2015-04-23 DIAGNOSIS — R079 Chest pain, unspecified: Secondary | ICD-10-CM | POA: Diagnosis not present

## 2015-04-26 ENCOUNTER — Encounter: Payer: Self-pay | Admitting: Internal Medicine

## 2015-05-19 ENCOUNTER — Encounter: Payer: Self-pay | Admitting: Internal Medicine

## 2015-05-22 NOTE — Telephone Encounter (Signed)
OV any provider for this issue - thanks

## 2015-05-25 ENCOUNTER — Encounter: Payer: Self-pay | Admitting: Family

## 2015-05-25 ENCOUNTER — Ambulatory Visit (INDEPENDENT_AMBULATORY_CARE_PROVIDER_SITE_OTHER): Payer: Medicare PPO | Admitting: Family

## 2015-05-25 VITALS — BP 128/70 | HR 77 | Temp 98.2°F | Resp 18 | Ht 64.0 in | Wt 144.8 lb

## 2015-05-25 DIAGNOSIS — J0100 Acute maxillary sinusitis, unspecified: Secondary | ICD-10-CM

## 2015-05-25 DIAGNOSIS — L609 Nail disorder, unspecified: Secondary | ICD-10-CM

## 2015-05-25 DIAGNOSIS — J329 Chronic sinusitis, unspecified: Secondary | ICD-10-CM | POA: Insufficient documentation

## 2015-05-25 DIAGNOSIS — L608 Other nail disorders: Secondary | ICD-10-CM | POA: Insufficient documentation

## 2015-05-25 NOTE — Progress Notes (Signed)
Subjective:    Patient ID: Erica [name], female    DOB: 18-Nov-1940, 74 y.o.   MRN: [id number]  Chief Complaint  Patient presents with  . Nail Problem    has a fungus on her right thumb nail, has sinus problems    HPI:  Erica [name] is a 74 y.o. female who  has a past medical history of Diverticulosis; Allergic rhinitis; Arthritis; Asthma; Diet-controlled type 2 diabetes mellitus ([place]) (09/2011 dx); Recurrent kidney stones; Dyslipidemia; and Macular degeneration. and presents today for an acute office visit.  1.) Nail problem - Previously seen by a dermatologist for cracking nail following trauma to her nail as a teenager and told there was nothing that she can do. Now experiencing the associated symptom change in nail color. Indicates that it may be looking a little better since the onset. She inquired through her pharmacist and there was concern there may be infection.   2.) Sinus problem - this is a new problem. Associated symptom of congestion, postnasal drip and sinus pressure has been going on for about 1.5 months. Denies any modifying factors or treatments in attempts to make it better. Denies any recent antibiotics.   Allergies  Allergen Reactions  . Scallops [Shellfish Allergy] Nausea And Vomiting  . Statins Other (See Comments)    Phobia of statins     Current Outpatient Prescriptions on File Prior to Visit  Medication Sig Dispense Refill  . cholecalciferol (VITAMIN D) 1000 UNITS tablet Take 1,000 Units by mouth daily.    . cyanocobalamin (,VITAMIN B-12,) 1000 MCG/ML injection Inject 1 mL (1,000 mcg total) into the muscle every 30 (thirty) days. 3 mL 3  . diphenhydrAMINE (BENADRYL) 25 MG tablet Take 25 mg by mouth every 6 (six) hours as needed.    . loperamide (IMODIUM) 2 MG capsule Take 2 mg by mouth as needed for diarrhea or loose stools.     No current facility-administered medications on file prior to visit.     Past Surgical History  Procedure Laterality  Date  . Tonsillectomy and adenoidectomy    . Dilation and curettage of uterus    . Cataract extraction Bilateral 01/2015    L on 8/8, R on 8/22      Review of Systems  Constitutional: Negative for fever and chills.  HENT: Positive for congestion and sinus pressure. Negative for ear pain and sore throat.   Respiratory: Negative for cough, chest tightness and shortness of breath.   Skin: Positive for color change (nail change).      Objective:    BP 128/70 mmHg  Pulse 77  Temp(Src) 98.2 F (36.8 C) (Oral)  Resp 18  Ht 5\' 4"  (1.626 m)  Wt 144 lb 12.8 oz (65.681 kg)  BMI 24.84 kg/m2  SpO2 95% Nursing note and vital signs reviewed.  Physical Exam  Constitutional: She is oriented to person, place, and time. She appears well-developed and well-nourished. No distress.  HENT:  Right Ear: Hearing, tympanic membrane, external ear and ear canal normal.  Left Ear: Hearing, tympanic membrane, external ear and ear canal normal.  Nose: Right sinus exhibits maxillary sinus tenderness. Right sinus exhibits no frontal sinus tenderness. Left sinus exhibits maxillary sinus tenderness. Left sinus exhibits no frontal sinus tenderness.  Mouth/Throat: Uvula is midline, oropharynx is clear and moist and mucous membranes are normal.  Cardiovascular: Normal rate, regular rhythm, normal heart sounds and intact distal pulses.   Pulmonary/Chest: Effort normal and breath sounds normal.  Neurological: She is  alert and oriented to person, place, and time.  Skin: Skin is warm and dry.  Right thumbnail with split distal one third and normal proximal nail. No pain or discoloration noted. Appears splinterlike and split in the middle  Psychiatric: She has a normal mood and affect. Her behavior is normal. Judgment and thought content normal.       Assessment & Plan:   Problem List Items Addressed This Visit      Respiratory   Sinusitis    Symptoms and exam consistent with sinusitis with sinus pressure and  no other symptoms noted at present. Treat conservatively with over-the-counter medications as needed for symptom relief. If symptoms worsen or fail to improve consider antibiotic therapy.        Musculoskeletal and Integument   Nail deformity - Primary    Right thumbnail with splinterlike/split deformity. Discussed potential referral to dermatology for further evaluation and possible biopsy to rule out fungal infection. Patient would like to observe at this time to see if improvement occurs on its own with most recent improvement. Follow-up for any worsening or referral to dermatology.

## 2015-05-25 NOTE — Assessment & Plan Note (Signed)
Right thumbnail with splinterlike/split deformity. Discussed potential referral to dermatology for further evaluation and possible biopsy to rule out fungal infection. Patient would like to observe at this time to see if improvement occurs on its own with most recent improvement. Follow-up for any worsening or referral to dermatology.

## 2015-05-25 NOTE — Progress Notes (Signed)
Pre visit review using our clinic review tool, if applicable. No additional management support is needed unless otherwise documented below in the visit note. 

## 2015-05-25 NOTE — Patient Instructions (Signed)
Thank you for choosing [facility].  Summary/Instructions:   Please continue to monitor your thumb - if no improvement please let us know and we will refer you to dermatology  General Recommendations:    Please drink plenty of fluids.  Get plenty of rest   Sleep in humidified air  Use saline nasal sprays  Netti pot   OTC Medications:  Decongestants - helps relieve congestion   Flonase (generic fluticasone) or Nasacort (generic triamcinolone) - please make sure to use the "cross-over" technique at a 45 degree angle towards the opposite eye as opposed to straight up the nasal passageway.   Sudafed (generic pseudoephedrine - Note this is the one that is available behind the pharmacy counter); Products with phenylephrine (-PE) may also be used but is often not as effective as pseudoephedrine.   If you have HIGH BLOOD PRESSURE - Coricidin HBP; AVOID any product that is -D as this contains pseudoephedrine which may increase your blood pressure.  Afrin (oxymetazoline) every 6-8 hours for up to 3 days.   Allergies - helps relieve runny nose, itchy eyes and sneezing   Claritin (generic loratidine), Allegra (fexofenidine), or Zyrtec (generic cyrterizine) for runny nose. These medications should not cause drowsiness.  Note - Benadryl (generic diphenhydramine) may be used however may cause drowsiness  Cough -   Delsym or Robitussin (generic dextromethorphan)  Expectorants - helps loosen mucus to ease removal   Mucinex (generic guaifenesin) as directed on the package.  Headaches / General Aches   Tylenol (generic acetaminophen) - DO NOT EXCEED 3 grams (3,000 mg) in a 24 hour time period  Advil/Motrin (generic ibuprofen)   Sore Throat -   Salt water gargle   Chloraseptic (generic benzocaine) spray or lozenges / Sucrets (generic dyclonine)

## 2015-05-25 NOTE — Assessment & Plan Note (Signed)
Symptoms and exam consistent with sinusitis with sinus pressure and no other symptoms noted at present. Treat conservatively with over-the-counter medications as needed for symptom relief. If symptoms worsen or fail to improve consider antibiotic therapy.

## 2015-06-27 ENCOUNTER — Encounter: Payer: Self-pay | Admitting: Internal Medicine

## 2015-06-27 ENCOUNTER — Ambulatory Visit (INDEPENDENT_AMBULATORY_CARE_PROVIDER_SITE_OTHER): Payer: Medicare Other | Admitting: Internal Medicine

## 2015-06-27 ENCOUNTER — Ambulatory Visit: Payer: Medicare PPO | Admitting: Internal Medicine

## 2015-06-27 ENCOUNTER — Other Ambulatory Visit (INDEPENDENT_AMBULATORY_CARE_PROVIDER_SITE_OTHER): Payer: Medicare Other

## 2015-06-27 VITALS — BP 110/58 | HR 77 | Temp 98.2°F | Resp 14 | Ht 64.0 in | Wt 143.0 lb

## 2015-06-27 DIAGNOSIS — E119 Type 2 diabetes mellitus without complications: Secondary | ICD-10-CM

## 2015-06-27 DIAGNOSIS — I2089 Other forms of angina pectoris: Secondary | ICD-10-CM

## 2015-06-27 DIAGNOSIS — I208 Other forms of angina pectoris: Secondary | ICD-10-CM

## 2015-06-27 DIAGNOSIS — E785 Hyperlipidemia, unspecified: Secondary | ICD-10-CM

## 2015-06-27 LAB — BRAIN NATRIURETIC PEPTIDE: Pro B Natriuretic peptide (BNP): 32 pg/mL (ref 0.0–100.0)

## 2015-06-27 LAB — TROPONIN I: TNIDX: 0 ug/l (ref 0.00–0.06)

## 2015-06-27 NOTE — Patient Instructions (Signed)
We will check some lab work today and call you back about the results to check on the heart.   We will also have you see the heart doctor for further work up of these episodes.   The EKG of the heart was not changed from last March and was not abnormal.

## 2015-06-27 NOTE — Assessment & Plan Note (Signed)
Her episodes are concerning for cardiac. It is unclear if the high altitude put extra stress on her heart. Given that she has had multiple episodes will refer to cardiology for further evaluation. It is unclear if her fatigue in the last several months is related as well.

## 2015-06-27 NOTE — Progress Notes (Signed)
Pre visit review using our clinic review tool, if applicable. No additional management support is needed unless otherwise documented below in the visit note. 

## 2015-06-27 NOTE — Progress Notes (Signed)
   Subjective:    Patient ID: Erica [name], female    DOB: Feb 08, 1941, 75 y.o.   MRN: [id number]  HPI The patient is a 75 YO female coming in for 3 episodes of chest and neck/jaw pain. She was visiting a daughter in [place] over the holidays (5000 feet elevation). She was walking at a good pace and had an episode with severe chest pain which radiated up to her neck. Rated 10/10. She had to stop due to pain and wait for the pain to pass. Took 5-10 minutes. Then she was able to walk slowly back to the house. She did not seek care. Next day she was dancing quickly at home and had a similar episode which took about 10 minutes to resolve. She had a third episode while rushing through the airport to get to her flight. Same symptoms and resolved on its own.During episode no diaphoresis, no nausea or vomiting, no arm pain. During one episode had SOB and felt like she could not get full breath due to pain. No history of heart attack or stroke. Had stress test in 09/2014 which did not show any problems. Has been feeling more sluggish since the summer.  Prior one other episode in October for which she sought care at urgent care. They did EKG which they said was normal and did not take any blood work. That episode was not as severe and more neck pain. She states they told her it was muscle sprain. Has mild nausea with that episode.   Review of Systems  Constitutional: Positive for fatigue. Negative for chills, activity change, appetite change and unexpected weight change.  HENT: Negative.   Eyes: Negative.   Respiratory: Positive for shortness of breath. Negative for cough, chest tightness and wheezing.   Cardiovascular: Positive for chest pain. Negative for palpitations and leg swelling.  Gastrointestinal: Negative for nausea, abdominal pain, diarrhea, constipation and abdominal distention.  Musculoskeletal: Positive for neck pain.  Skin: Negative.   Neurological: Negative.   Psychiatric/Behavioral:  Negative.       Objective:   Physical Exam  Constitutional: She is oriented to person, place, and time. She appears well-developed and well-nourished.  HENT:  Head: Normocephalic and atraumatic.  Eyes: EOM are normal.  Neck: Normal range of motion. No JVD present. No thyromegaly present.  Cardiovascular: Normal rate and regular rhythm.   No murmur heard. Carotids without murmur  Pulmonary/Chest: Effort normal and breath sounds normal. No respiratory distress. She has no wheezes. She has no rales.  Abdominal: Soft. Bowel sounds are normal. She exhibits no distension. There is no tenderness. There is no rebound.  Musculoskeletal: She exhibits no edema.  Neck without tenderness to palpation  Lymphadenopathy:    She has no cervical adenopathy.  Neurological: She is alert and oriented to person, place, and time.  Skin: Skin is warm and dry.  Psychiatric: She has a normal mood and affect.   Filed Vitals:   06/27/15 0847  BP: 110/58  Pulse: 77  Temp: 98.2 F (36.8 C)  TempSrc: Oral  Resp: 14  Height: 5\' 4"  (1.626 m)  Weight: 143 lb (64.864 kg)  SpO2: 98%   EKG: rate 67, axis normal, regular sinus, intervals normal, rsr' in v1, compared to prior no significant change, no ST or T wave changes.     Assessment & Plan:

## 2015-06-27 NOTE — Assessment & Plan Note (Signed)
Most recent LDL 128 which is slightly above goal given her concurrent diagnosis of diabetes. Not tolerant of statins.

## 2015-06-27 NOTE — Assessment & Plan Note (Signed)
This does put her at increased risk of heart disease although she does not currently take medicine for the sugars and last HgA1c 6.0.

## 2015-07-03 NOTE — Progress Notes (Signed)
2   Cardiology Office Note   Date:  07/04/2015   ID:  Erica [name], Erica [name] 06/30/1940, MRN [id number]  PCP:  Myrlene Broker, MD  Cardiologist:   Madilyn Hook, MD   Chief Complaint  Patient presents with  . New Patient (Initial Visit)    no chst pain, no shortness of breath, edema and pain in lower Left leg, no cramping, no lightheaded or dizziness      History of Present Illness: Erica [name] is a 75 y.o. female with diabetes mellitus type 2 and hyperlipidemia who presents for n evaluation of chest pain. Ms. Ridl was seen by her primary care provider, Dr. Hillard Danker, on 06/27/15. At that appointment she reported several episodes of chest, neck, and jaw pain. EKG was unchanged from prior.  She was referred to cardiology for further evaluation.  She reports one episode that occurred in October and another event that was more severe while visiting her daughter in [place] at [place].  She went walking with her husband and as she was walking up an incline felt 6 out of 10 substernal chest pain that radiated to her left arm and neck.  The sensation was associated with shortness of breath.  She denied ausea but did feel clammy and diaphoretic. The sensation improved with rest but recurred when she tried to walk up the hill again. It subsided while walking on flat land.  She had recurrent symptoms the following day when she tried to dance with her husband.  hile boarding the plane several days later she had a small episode that was not as severe.The pain has not recurred since she returned home. However, she has not been exerting herself out of fear she will have chest pain.   Ms. Spruiell denies lower extremity edema, other than left ankle swelling occasionally. This has been ongoing since she fractured her ankle twice.She has noted occasional orthopnea but denies PND.  She sleeps on one to two pillows.  She reports a feeling of general fatigue that has been ongoing  since the fall.  She also has not been sleeping well at night.  She attributes this somewhat to frequent night awakenings to urinate.When she gets up to use the bathroom she has a hard time falling back to sleep.  Ms. Samonte had an exercise Myoview stess test 2 years ago that was normal.  She exercises once or twice per week by dancing.  She was walking regularly until the Fall but has been feeling tired and no longer walks for exercise.  Past Medical History  Diagnosis Date  . Diverticulosis   . Allergic rhinitis   . Arthritis   . Asthma   . Diet-controlled type 2 diabetes mellitus ([facility]) 09/2011 dx  . Recurrent kidney stones   . Dyslipidemia   . Macular degeneration     genetic etiology, not age-related per retinal specailist    Past Surgical History  Procedure Laterality Date  . Tonsillectomy and adenoidectomy    . Dilation and curettage of uterus    . Cataract extraction Bilateral 01/2015    L on 8/8, R on 8/22     Current Outpatient Prescriptions  Medication Sig Dispense Refill  . cholecalciferol (VITAMIN D) 1000 UNITS tablet Take 1,000 Units by mouth daily.    . cyanocobalamin (,VITAMIN B-12,) 1000 MCG/ML injection Inject 1 mL (1,000 mcg total) into the muscle every 30 (thirty) days. 3 mL 3  . diphenhydrAMINE (BENADRYL) 25 MG tablet Take 25  mg by mouth every 6 (six) hours as needed.    . loperamide (IMODIUM) 2 MG capsule Take 2 mg by mouth as needed for diarrhea or loose stools.    . nitroGLYCERIN (NITROSTAT) 0.4 MG SL tablet Place 1 tablet (0.4 mg total) under the tongue every 5 (five) minutes as needed for chest pain. 25 tablet 3   No current facility-administered medications for this visit.    Allergies:   Scallops and Statins    Social History:  The patient  reports that she has never smoked. She has never used smokeless tobacco. She reports that she drinks about 1.8 oz of alcohol per week. She reports that she does not use illicit drugs.   Family History:  The  patient's family history includes Arthritis in her mother; Cancer in her mother; Colon cancer in her mother; Diabetes in her brother and paternal aunt; Heart disease in her father; Hyperlipidemia in her father; Hypertension in her father.    ROS:  Please see the history of present illness.   Otherwise, review of systems are positive for none.   All other systems are reviewed and negative.    PHYSICAL EXAM: VS:  BP 110/66 mmHg  Pulse 80  Ht 5\' 4"  (1.626 m)  Wt 65.828 kg (145 lb 2 oz)  BMI 24.90 kg/m2 , BMI Body mass index is 24.9 kg/(m^2). GENERAL:  Well appearing HEENT:  Pupils equal round and reactive, fundi not visualized, oral mucosa unremarkable NECK:  No jugular venous distention, waveform within normal limits, carotid upstroke brisk and symmetric, no bruits, no thyromegaly LYMPHATICS:  No cervical adenopathy LUNGS:  Clear to auscultation bilaterally HEART:  RRR.  PMI not displaced or sustained,S1 and S2 within normal limits, no S3, no S4, no clicks, no rubs, no murmurs ABD:  Flat, positive bowel sounds normal in frequency in pitch, no bruits, no rebound, no guarding, no midline pulsatile mass, no hepatomegaly, no splenomegaly EXT:  2 plus pulses throughout, no edema, no cyanosis no clubbing SKIN:  No rashes no nodules NEURO:  Cranial nerves II through XII grossly intact, motor grossly intact throughout PSYCH:  Cognitively intact, oriented to person place and time   EKG:  EKG is not ordered today. Independent interpretation of the ekg ordered 06/27/15 demonstrates sinus rhythm rate 67 bpm.  Exercise Myoview 09/27/14: Impression Exercise Capacity: Good exercise capacity. BP Response: Normal blood pressure response. Clinical Symptoms: Right clavicular pain. ECG Impression: No significant ST segment change suggestive of ischemia. Comparison with Prior Nuclear Study: No previous nuclear study performed  Overall Impression: Normal stress nuclear study.  LV Ejection Fraction:  69%. LV Wall Motion: NL LV Function; NL Wall Motion  Recent Labs: 03/07/2015: ALT 18; BUN 14; Creatinine, Ser 0.73; Hemoglobin 14.5; Platelets 298.0; Potassium 4.8; Sodium 138; TSH 2.47 06/27/2015: Pro B Natriuretic peptide (BNP) 32.0    Lipid Panel    Component Value Date/Time   CHOL 186 09/01/2014 1004   TRIG 99.0 09/01/2014 1004   HDL 38.30* 09/01/2014 1004   CHOLHDL 5 09/01/2014 1004   VLDL 19.8 09/01/2014 1004   LDLCALC 128* 09/01/2014 1004   LDLDIRECT 149.9 02/25/2013 1010      Wt Readings from Last 3 Encounters:  07/04/15 65.828 kg (145 lb 2 oz)  06/27/15 64.864 kg (143 lb)  05/25/15 65.681 kg (144 lb 12.8 oz)      ASSESSMENT AND PLAN:  # Chest pain: Ms. Tengan's symptoms are concerning for coronary disease. She has noted progressive exertional dyspnea and chest pain  that is alleviated with rest. We will obtain an exercise Cardiolite to evaluate for ischemia. Other potential etiologies include pulmonary embolism, though this seems unlikely as she had symptoms in October which preceded her recent plane ride.  She will start aspirin 81 mg daily and nitroglycerin 0.4 mg every 5 minutes when necessary.  If her symptoms do not resolve after 3 nitroglycerin tablets she understands that she should present to the emergency department for evaluation.  # Hyperlipidemia: Ms. Schaible was advised bycare physicia to start a as this contributed to the progression of hercataracts. Her ASCVD 10 year risk is 13.6%.  We will wait until the above testing is completed prior to discussing the need for statin therapy.     Current medicines are reviewed at length with the patient today.  The patient does not have concerns regarding medicines.  The following changes have been made:  Start aspirin and nitroglycerin  Labs/ tests ordered today include:   Orders Placed This Encounter  Procedures  . Myocardial Perfusion Imaging     Disposition:   FU with Naseem Varden C. Duke Salvia, MD, [facility] in 6  months.    This note was written with the assistance of speech recognition software.  Please excuse any transcriptional errors.  Signed, Harkirat Orozco C. Duke Salvia, MD, [facility]  07/04/2015 1:25 PM    [facility]

## 2015-07-04 ENCOUNTER — Encounter: Payer: Self-pay | Admitting: Cardiovascular Disease

## 2015-07-04 ENCOUNTER — Ambulatory Visit (INDEPENDENT_AMBULATORY_CARE_PROVIDER_SITE_OTHER): Payer: Medicare Other | Admitting: Cardiovascular Disease

## 2015-07-04 VITALS — BP 110/66 | HR 80 | Ht 64.0 in | Wt 145.1 lb

## 2015-07-04 DIAGNOSIS — R079 Chest pain, unspecified: Secondary | ICD-10-CM

## 2015-07-04 DIAGNOSIS — E785 Hyperlipidemia, unspecified: Secondary | ICD-10-CM

## 2015-07-04 DIAGNOSIS — R0602 Shortness of breath: Secondary | ICD-10-CM | POA: Diagnosis not present

## 2015-07-04 MED ORDER — NITROGLYCERIN 0.4 MG SL SUBL: 0.4000 mg | SUBLINGUAL_TABLET | SUBLINGUAL | Status: DC | PRN

## 2015-07-04 NOTE — Patient Instructions (Signed)
Medication Instructions:  START Aspirin 81 mg - take 1 tablet daily START Nitroglycerin 0.4 mg - place 1 tablet underneath your tongue and let dissolve every 5 minutes as needed for up to 3 doses for chest pain. Please go to the ER if your chest pain has not resolved with the 3rd dose of nitroglycerin.  Labwork: NONE  Testing/Procedures: 1. Exercise Cardiolite Stress test - Your physician has requested that you have en exercise stress myoview. For further information please visit [email or web address]. Please follow instruction sheet, as given.  Follow-Up: Dr Oval Linsey recommends that you schedule a follow-up appointment in 6 months. You will receive a reminder letter in the mail two months in advance. If you don't receive a letter, please call our office to schedule the follow-up appointment.  If you need a refill on your cardiac medications before your next appointment, please call your pharmacy.

## 2015-07-06 ENCOUNTER — Ambulatory Visit (HOSPITAL_COMMUNITY)
Admission: RE | Admit: 2015-07-06 | Discharge: 2015-07-06 | Disposition: A | Discharge status: Home / Self Care | Payer: Medicare Other | Source: Ambulatory Visit | Attending: Cardiology | Admitting: Cardiology

## 2015-07-06 DIAGNOSIS — R079 Chest pain, unspecified: Secondary | ICD-10-CM

## 2015-07-06 DIAGNOSIS — R0602 Shortness of breath: Secondary | ICD-10-CM

## 2015-07-10 ENCOUNTER — Other Ambulatory Visit: Payer: Self-pay | Admitting: Cardiovascular Disease

## 2015-07-10 DIAGNOSIS — R0602 Shortness of breath: Secondary | ICD-10-CM

## 2015-07-10 DIAGNOSIS — R079 Chest pain, unspecified: Secondary | ICD-10-CM

## 2015-07-14 ENCOUNTER — Telehealth (HOSPITAL_COMMUNITY): Payer: Self-pay

## 2015-07-14 NOTE — Telephone Encounter (Signed)
Encounter complete. 

## 2015-07-18 ENCOUNTER — Telehealth (HOSPITAL_COMMUNITY): Payer: Self-pay

## 2015-07-18 NOTE — Telephone Encounter (Signed)
Encounter complete. 

## 2015-07-19 ENCOUNTER — Ambulatory Visit (HOSPITAL_COMMUNITY)
Admission: RE | Admit: 2015-07-19 | Discharge: 2015-07-19 | Disposition: A | Discharge status: Home / Self Care | Payer: Medicare Other | Source: Ambulatory Visit | Attending: Cardiology | Admitting: Cardiology

## 2015-07-19 DIAGNOSIS — E119 Type 2 diabetes mellitus without complications: Secondary | ICD-10-CM | POA: Diagnosis not present

## 2015-07-19 DIAGNOSIS — R0609 Other forms of dyspnea: Secondary | ICD-10-CM | POA: Diagnosis not present

## 2015-07-19 DIAGNOSIS — R0602 Shortness of breath: Secondary | ICD-10-CM | POA: Insufficient documentation

## 2015-07-19 DIAGNOSIS — R079 Chest pain, unspecified: Secondary | ICD-10-CM | POA: Diagnosis not present

## 2015-07-19 DIAGNOSIS — R0789 Other chest pain: Secondary | ICD-10-CM | POA: Insufficient documentation

## 2015-07-19 DIAGNOSIS — R5383 Other fatigue: Secondary | ICD-10-CM | POA: Insufficient documentation

## 2015-07-19 DIAGNOSIS — R002 Palpitations: Secondary | ICD-10-CM | POA: Diagnosis not present

## 2015-07-19 DIAGNOSIS — Z8249 Family history of ischemic heart disease and other diseases of the circulatory system: Secondary | ICD-10-CM | POA: Diagnosis not present

## 2015-07-19 MED ORDER — TECHNETIUM TC 99M SESTAMIBI GENERIC - CARDIOLITE
10.3000 | Freq: Once | INTRAVENOUS | Status: AC | PRN
  Administered 2015-07-19: 10.3 via INTRAVENOUS

## 2015-07-19 MED ORDER — TECHNETIUM TC 99M SESTAMIBI GENERIC - CARDIOLITE
30.2000 | Freq: Once | INTRAVENOUS | Status: AC | PRN
  Administered 2015-07-19: 30.2 via INTRAVENOUS

## 2015-07-20 LAB — MYOCARDIAL PERFUSION IMAGING
Estimated workload: 8.5 METS
Exercise duration (min): 7 min
LV dias vol: 49 mL
LV sys vol: 16 mL
MPHR: 144 {beats}/min
Peak HR: 150 {beats}/min
Percent HR: 104 %
RPE: 15
Rest HR: 68 {beats}/min
SDS: 2
SRS: 4
SSS: 6
TID: 1.08

## 2015-07-21 ENCOUNTER — Telehealth: Payer: Self-pay | Admitting: *Deleted

## 2015-07-21 NOTE — Telephone Encounter (Signed)
-----   Message from Skeet Latch, MD sent at 07/20/2015 10:57 PM EST ----- Normal stress test.

## 2015-07-21 NOTE — Telephone Encounter (Signed)
Spoke to patient. Result given . Verbalized understanding  

## 2015-07-24 ENCOUNTER — Encounter: Payer: Self-pay | Admitting: Internal Medicine

## 2015-07-25 NOTE — Telephone Encounter (Signed)
Can you call her and schedule if she wants?

## 2015-07-26 ENCOUNTER — Ambulatory Visit (INDEPENDENT_AMBULATORY_CARE_PROVIDER_SITE_OTHER): Payer: Medicare Other | Admitting: Nurse Practitioner

## 2015-07-26 VITALS — BP 100/54 | HR 73 | Temp 98.1°F | Ht 64.0 in | Wt 145.0 lb

## 2015-07-26 DIAGNOSIS — J069 Acute upper respiratory infection, unspecified: Secondary | ICD-10-CM

## 2015-07-26 DIAGNOSIS — B9789 Other viral agents as the cause of diseases classified elsewhere: Principal | ICD-10-CM

## 2015-07-26 MED ORDER — AZITHROMYCIN 250 MG PO TABS: ORAL_TABLET | ORAL | Status: DC

## 2015-07-26 MED ORDER — METHYLPREDNISOLONE 4 MG PO TABS: ORAL_TABLET | ORAL | Status: DC

## 2015-07-26 NOTE — Progress Notes (Signed)
Pre visit review using our clinic review tool, if applicable. No additional management support is needed unless otherwise documented below in the visit note. 

## 2015-07-26 NOTE — Patient Instructions (Signed)
Prednisone with breakfast or lunch at the latest.  6 tablets on day 1, 5 tablets on day 2, 4 tablets on day 3, 3 tablets on day 4, 2 tablets day 5, 1 tablet on day 6...done! Take tablets all together not spaced out Don't take with NSAIDs (Ibuprofen, Aleve, Naproxen, Meloxicam ect...)

## 2015-07-26 NOTE — Progress Notes (Signed)
Patient ID: Erica [name], female    DOB: 1940-10-13  Age: 75 y.o. MRN: [id number]  CC: Bronchitis and Sinusitis   HPI Shunta ROMEY SOBIERAJ presents for flu 2 weeks ago Monday, coughing worsening.   1) Worse during daytime  Cough productive since last night  Ran a temp for about 5 days-subjective temperature  Treatment to date: Cough medicine and cough drops- helpful     History Matalie has a past medical history of Diverticulosis; Allergic rhinitis; Arthritis; Asthma; Diet-controlled type 2 diabetes mellitus ([place]) (09/2011 dx); Recurrent kidney stones; Dyslipidemia; and Macular degeneration.   She has past surgical history that includes Tonsillectomy and adenoidectomy; Dilation and curettage of uterus; and Cataract extraction (Bilateral, 01/2015).   Her family history includes Arthritis in her mother; Cancer in her mother; Colon cancer in her mother; Diabetes in her brother and paternal aunt; Heart disease in her father; Hyperlipidemia in her father and son; Hypertension in her father.She reports that she has never smoked. She has never used smokeless tobacco. She reports that she drinks about 1.8 oz of alcohol per week. She reports that she does not use illicit drugs.  Outpatient Prescriptions Prior to Visit  Medication Sig Dispense Refill  . cholecalciferol (VITAMIN D) 1000 UNITS tablet Take 1,000 Units by mouth daily.    . cyanocobalamin (,VITAMIN B-12,) 1000 MCG/ML injection Inject 1 mL (1,000 mcg total) into the muscle every 30 (thirty) days. 3 mL 3  . diphenhydrAMINE (BENADRYL) 25 MG tablet Take 25 mg by mouth every 6 (six) hours as needed.    . loperamide (IMODIUM) 2 MG capsule Take 2 mg by mouth as needed for diarrhea or loose stools. Reported on 07/26/2015    . nitroGLYCERIN (NITROSTAT) 0.4 MG SL tablet Place 1 tablet (0.4 mg total) under the tongue every 5 (five) minutes as needed for chest pain. (Patient not taking: Reported on 07/26/2015) 25 tablet 3   No facility-administered  medications prior to visit.    ROS Review of Systems  Constitutional: Positive for fever. Negative for chills, diaphoresis and fatigue.  Respiratory: Positive for cough. Negative for chest tightness, shortness of breath and wheezing.   Cardiovascular: Negative for chest pain, palpitations and leg swelling.  Gastrointestinal: Negative for nausea, vomiting and diarrhea.  Skin: Negative for rash.  Neurological: Negative for dizziness, weakness, numbness and headaches.  Psychiatric/Behavioral: The patient is not nervous/anxious.     Objective:  BP 100/54 mmHg  Pulse 73  Temp(Src) 98.1 F (36.7 C) (Oral)  Ht 5\' 4"  (1.626 m)  Wt 145 lb (65.772 kg)  BMI 24.88 kg/m2  SpO2 97%  Physical Exam  Constitutional: She is oriented to person, place, and time. She appears well-developed and well-nourished. No distress.  HENT:  Head: Normocephalic and atraumatic.  Right Ear: External ear normal.  Left Ear: External ear normal.  Mouth/Throat: Oropharynx is clear and moist. No oropharyngeal exudate.  Eyes: EOM are normal. Pupils are equal, round, and reactive to light. Right eye exhibits no discharge. Left eye exhibits no discharge. No scleral icterus.  Neck: Normal range of motion. Neck supple.  Cardiovascular: Normal rate, regular rhythm and normal heart sounds.  Exam reveals no gallop and no friction rub.   No murmur heard. Pulmonary/Chest: Effort normal and breath sounds normal. No respiratory distress. She has no wheezes. She has no rales. She exhibits no tenderness.  Lymphadenopathy:    She has no cervical adenopathy.  Neurological: She is alert and oriented to person, place, and time. No cranial nerve deficit.  She exhibits normal muscle tone. Coordination normal.  Skin: Skin is warm and dry. No rash noted. She is not diaphoretic.  Psychiatric: She has a normal mood and affect. Her behavior is normal. Judgment and thought content normal.   Assessment & Plan:   Jamie was seen today for  bronchitis and sinusitis.  Diagnoses and all orders for this visit:  Viral URI with cough  Other orders -     methylPREDNISolone (MEDROL) 4 MG tablet; Take 6 tablets by mouth with breakfast or lunch and decrease by 1 tablet each day until gone. -     azithromycin (ZITHROMAX) 250 MG tablet; Take 2 tablets by mouth on day 1, take 1 tablet by mouth each day after for 4 days.   I am having Ms. Miyamoto start on methylPREDNISolone and azithromycin. I am also having her maintain her loperamide, diphenhydrAMINE, cholecalciferol, cyanocobalamin, nitroGLYCERIN, and aspirin.  Meds ordered this encounter  Medications  . aspirin 81 MG tablet    Sig: Take 81 mg by mouth daily.  . methylPREDNISolone (MEDROL) 4 MG tablet    Sig: Take 6 tablets by mouth with breakfast or lunch and decrease by 1 tablet each day until gone.    Dispense:  21 tablet    Refill:  0    Order Specific Question:  Supervising Provider    Answer:  Deborra Medina L [[id number]]  . azithromycin (ZITHROMAX) 250 MG tablet    Sig: Take 2 tablets by mouth on day 1, take 1 tablet by mouth each day after for 4 days.    Dispense:  6 each    Refill:  0    Order Specific Question:  Supervising Provider    Answer:  Crecencio Mc [[id number]]     Follow-up: Return if symptoms worsen or fail to improve.

## 2015-07-30 ENCOUNTER — Encounter: Payer: Self-pay | Admitting: Nurse Practitioner

## 2015-07-30 DIAGNOSIS — B9789 Other viral agents as the cause of diseases classified elsewhere: Principal | ICD-10-CM

## 2015-07-30 DIAGNOSIS — J069 Acute upper respiratory infection, unspecified: Secondary | ICD-10-CM | POA: Insufficient documentation

## 2015-07-30 NOTE — Assessment & Plan Note (Signed)
New onset Will treat conservatively due to probable viral nature Prednisone taper to decrease inflammation given  Instructions for taking done verbally and on AVS Antibiotic prescription printed/sent to pharmacy in case of no improvement or fever within 48 hours. Pt verbalized understanding of need to hold.  FU prn worsening/failure to improve.

## 2015-08-13 NOTE — Progress Notes (Signed)
2   Cardiology Office Note   Date:  08/14/2015   ID:  Erica, [name] [birth year], MRN [id number]  PCP:  Myrlene Broker, MD  Cardiologist:   Madilyn Hook, MD   Chief Complaint  Patient presents with  . Chest Pain    Pain with exertion, advice about exercise     Patient ID: Erica [name] is a 75 y.o. female with hyperlipidemia who presents for an evaluation of chest pain.   Interval History 08/14/15: After her last appointment Erica [name] underwent exercise Cardiolite that was negative for ischemia.  She notes that she continues to have chest pain.  It typically occurs in the setting of stress accompanied by physical exertion.  She has been fearful of resuming her exercise routine.  The episodes last for a few moments an are associated with shortness of breath.  She notes that she has been feeling anxious lately. She has an adult daughter who is going through a divorce and her son is an alcoholic.  Her PCP previously recommended that she start an SSRI for depression, but she declined.  She does not think she is depressed.  She does admit to feeling stressed and anxious.   History of Present Illness 07/03/15: Erica [name] was seen by her primary care provider, Dr. Hillard Danker, on 06/27/15. At that appointment she reported several episodes of chest, neck, and jaw pain. EKG was unchanged from prior.  She was referred to cardiology for further evaluation.  She reports one episode that occurred in October and another event that was more severe while visiting her daughter in [place] at [place].  She went walking with her husband and as she was walking up an incline felt 6 out of 10 substernal chest pain that radiated to her left arm and neck.  The sensation was associated with shortness of breath.  She denied nausea but did feel clammy and diaphoretic. The sensation improved with rest but recurred when she tried to walk up the hill again. It subsided while walking on flat  land.  She had recurrent symptoms the following day when she tried to dance with her husband.  While boarding the plane several days later she had a small episode that was not as severe.The pain has not recurred since she returned home. However, she has not been exerting herself out of fear she will have chest pain.   Erica [name] denies lower extremity edema, other than left ankle swelling occasionally. This has been ongoing since she fractured her ankle twice.She has noted occasional orthopnea but denies PND.  She sleeps on one to two pillows.  She reports a feeling of general fatigue that has been ongoing since the fall.  She also has not been sleeping well at night.  She attributes this somewhat to frequent night awakenings to urinate.When she gets up to use the bathroom she has a hard time falling back to sleep.  Erica [name] had an exercise Myoview stess test 2 years ago that was normal.  She exercises once or twice per week by dancing.  She was walking regularly until the Fall but has been feeling tired and no longer walks for exercise.  Past Medical History  Diagnosis Date  . Diverticulosis   . Allergic rhinitis   . Arthritis   . Asthma   . Diet-controlled type 2 diabetes mellitus ([facility]) 09/2011 dx  . Recurrent kidney stones   . Dyslipidemia   . Macular degeneration  genetic etiology, not age-related per retinal specailist    Past Surgical History  Procedure Laterality Date  . Tonsillectomy and adenoidectomy    . Dilation and curettage of uterus    . Cataract extraction Bilateral 01/2015    L on 8/8, R on 8/22     Current Outpatient Prescriptions  Medication Sig Dispense Refill  . aspirin 81 MG tablet Take 81 mg by mouth daily.    . cholecalciferol (VITAMIN D) 1000 UNITS tablet Take 1,000 Units by mouth daily.    . cyanocobalamin (,VITAMIN B-12,) 1000 MCG/ML injection Inject 1 mL (1,000 mcg total) into the muscle every 30 (thirty) days. 3 mL 3  . diphenhydrAMINE (BENADRYL) 25  MG tablet Take 25 mg by mouth every 6 (six) hours as needed.    . loperamide (IMODIUM) 2 MG capsule Take 2 mg by mouth as needed for diarrhea or loose stools. Reported on 07/26/2015    . nitroGLYCERIN (NITROSTAT) 0.4 MG SL tablet Place 1 tablet (0.4 mg total) under the tongue every 5 (five) minutes as needed for chest pain. 25 tablet 3   No current facility-administered medications for this visit.    Allergies:   Scallops and Statins    Social History:  The patient  reports that she has never smoked. She has never used smokeless tobacco. She reports that she drinks about 1.8 oz of alcohol per week. She reports that she does not use illicit drugs.   Family History:  The patient's family history includes Arthritis in her mother; Cancer in her mother; Colon cancer in her mother; Diabetes in her brother and paternal aunt; Heart disease in her father; Hyperlipidemia in her father and son; Hypertension in her father.    ROS:  Please see the history of present illness.   Otherwise, review of systems are positive for none.   All other systems are reviewed and negative.    PHYSICAL EXAM: VS:  BP 108/70 mmHg  Pulse 72  Ht 5\' 4"  (1.626 m)  Wt 65.318 kg (144 lb)  BMI 24.71 kg/m2 , BMI Body mass index is 24.71 kg/(m^2). GENERAL:  Well appearing HEENT:  Pupils equal round and reactive, fundi not visualized, oral mucosa unremarkable NECK:  No jugular venous distention, waveform within normal limits, carotid upstroke brisk and symmetric, no bruits LYMPHATICS:  No cervical adenopathy LUNGS:  Clear to auscultation bilaterally HEART:  RRR.  PMI not displaced or sustained,S1 and S2 within normal limits, no S3, no S4, no clicks, no rubs, no murmurs ABD:  Flat, positive bowel sounds normal in frequency in pitch, no bruits, no rebound, no guarding, no midline pulsatile mass, no hepatomegaly, no splenomegaly EXT:  2 plus pulses throughout, no edema, no cyanosis no clubbing SKIN:  No rashes no nodules NEURO:   Cranial nerves II through XII grossly intact, motor grossly intact throughout PSYCH:  Cognitively intact, oriented to person place and time   EKG:  EKG is not ordered today. Independent interpretation of the ekg ordered 06/27/15 demonstrates sinus rhythm rate 67 bpm.  Exercise Myoview 09/27/14: Impression Exercise Capacity: Good exercise capacity. BP Response: Normal blood pressure response. Clinical Symptoms: Right clavicular pain. ECG Impression: No significant ST segment change suggestive of ischemia. Comparison with Prior Nuclear Study: No previous nuclear study performed  Overall Impression: Normal stress nuclear study.  LV Ejection Fraction: 69%. LV Wall Motion: NL LV Function; NL Wall Motion  Exercise Cardiolite 07/19/15:  The left ventricular ejection fraction is hyperdynamic (>65%).  Nuclear stress EF: 68%.  There was no ST segment deviation noted during stress.  The study is normal.  This is a low risk study.  Normal myocardial perfusion.  Normal exercise nuclear study demonstrating normal myocardial perfusion and function; EF 68%. The patient exercised to an 8.5 met workload and peak HR of 104% APMHR without chest pain or ECG changes of ischemia.  Recent Labs: 03/07/2015: ALT 18; BUN 14; Creatinine, Ser 0.73; Hemoglobin 14.5; Platelets 298.0; Potassium 4.8; Sodium 138; TSH 2.47 06/27/2015: Pro B Natriuretic peptide (BNP) 32.0    Lipid Panel    Component Value Date/Time   CHOL 186 09/01/2014 1004   TRIG 99.0 09/01/2014 1004   HDL 38.30* 09/01/2014 1004   CHOLHDL 5 09/01/2014 1004   VLDL 19.8 09/01/2014 1004   LDLCALC 128* 09/01/2014 1004   LDLDIRECT 149.9 02/25/2013 1010      Wt Readings from Last 3 Encounters:  08/14/15 65.318 kg (144 lb)  07/26/15 65.772 kg (145 lb)  07/19/15 65.772 kg (145 lb)      ASSESSMENT AND PLAN:  # Chest pain: Stress test was negative for ischemia.  She wondered whether she could have microvascular disease.  I  independently reviewed her stress test today and it is completely normal.  Also, her only risk factor is hyperlipidemia, making this diagnosis very unlikely.  I suspect that her chest pain is related to stress and anxiety. I suggested that she discuss this with her PCP and consider seeing a therapist.  She expressed understanding.   # Hyperlipidemia: Her ASCVD 10 year risk is 13.6%.  I recommended that she increase her physical activity to at least 30 minutes most days of the week.  Repeat lipids in months.  If they remain elevated, we will start a statin.   Current medicines are reviewed at length with the patient today.  The patient does not have concerns regarding medicines.  The following changes have been made: None  Labs/ tests ordered today include:   No orders of the defined types were placed in this encounter.     Disposition:   FU with Josejulian Tarango C. Duke Salvia, MD, [facility] in 1 year.    This note was written with the assistance of speech recognition software.  Please excuse any transcriptional errors.  Signed, Gregori Abril C. Duke Salvia, MD, [facility]  08/14/2015 3:28 PM    [facility]

## 2015-08-14 ENCOUNTER — Encounter: Payer: Self-pay | Admitting: Cardiovascular Disease

## 2015-08-14 ENCOUNTER — Ambulatory Visit (INDEPENDENT_AMBULATORY_CARE_PROVIDER_SITE_OTHER): Payer: Medicare Other | Admitting: Cardiovascular Disease

## 2015-08-14 VITALS — BP 108/70 | HR 72 | Ht 64.0 in | Wt 144.0 lb

## 2015-08-14 DIAGNOSIS — F419 Anxiety disorder, unspecified: Secondary | ICD-10-CM | POA: Diagnosis not present

## 2015-08-14 DIAGNOSIS — E785 Hyperlipidemia, unspecified: Secondary | ICD-10-CM | POA: Diagnosis not present

## 2015-08-14 DIAGNOSIS — R072 Precordial pain: Secondary | ICD-10-CM | POA: Diagnosis not present

## 2015-08-14 NOTE — Patient Instructions (Signed)
No changes with current  Medications.   Your physician wants you to follow-up in 12 months with Dr Oval Linsey.  You will receive a reminder letter in the mail two months in advance. If you don't receive a letter, please call our office to schedule the follow-up appointment.  If you need a refill on your cardiac medications before your next appointment, please call your pharmacy.

## 2015-09-02 ENCOUNTER — Encounter: Payer: Self-pay | Admitting: Internal Medicine

## 2015-09-26 ENCOUNTER — Ambulatory Visit (INDEPENDENT_AMBULATORY_CARE_PROVIDER_SITE_OTHER): Payer: Medicare Other

## 2015-09-26 VITALS — BP 120/80 | Ht 64.0 in | Wt 144.5 lb

## 2015-09-26 DIAGNOSIS — Z0000 Encounter for general adult medical examination without abnormal findings: Secondary | ICD-10-CM

## 2015-09-26 NOTE — Patient Instructions (Addendum)
[name] , Thank you for taking time to come for your Medicare Wellness Visit. I appreciate your ongoing commitment to your health goals. Please review the following plan we discussed and let me know if I can assist you in the future.   Mammogram optional;  Can discuss future dexa with Dr. Sharlet [name] I will fup on B12 and night cramping   Osteoporosis foundation   Goals; will increase her exercise    These are the goals we discussed: Goals    None      This is a list of the screening recommended for you and due dates:  Health Maintenance  Topic Date Due  . Complete foot exam   09/01/2015  . Urine Protein Check  09/01/2015  . Hemoglobin A1C  09/04/2015  . Eye exam for diabetics  12/28/2015  . Flu Shot  01/23/2016  . Colon Cancer Screening  01/13/2017  . Tetanus Vaccine  06/25/2019  . DEXA scan (bone density measurement)  Completed  . Shingles Vaccine  Completed  . Pneumonia vaccines  Completed   To note; patient is pre-diabetic; foot exam, urine protein and A1c postponed.  Fall Prevention in the Home  Falls can cause injuries. They can happen to people of all ages. There are many things you can do to make your home safe and to help prevent falls.  WHAT CAN I DO ON THE OUTSIDE OF MY HOME?  Regularly fix the edges of walkways and driveways and fix any cracks.  Remove anything that might make you trip as you walk through a door, such as a raised step or threshold.  Trim any bushes or trees on the path to your home.  Use bright outdoor lighting.  Clear any walking paths of anything that might make someone trip, such as rocks or tools.  Regularly check to see if handrails are loose or broken. Make sure that both sides of any steps have handrails.  Any raised decks and porches should have guardrails on the edges.  Have any leaves, snow, or ice cleared regularly.  Use sand or salt on walking paths during winter.  Clean up any spills in your garage right away. This  includes oil or grease spills. WHAT CAN I DO IN THE BATHROOM?   Use night lights.  Install grab bars by the toilet and in the tub and shower. Do not use towel bars as grab bars.  Use non-skid mats or decals in the tub or shower.  If you need to sit down in the shower, use a plastic, non-slip stool.  Keep the floor dry. Clean up any water that spills on the floor as soon as it happens.  Remove soap buildup in the tub or shower regularly.  Attach bath mats securely with double-sided non-slip rug tape.  Do not have throw rugs and other things on the floor that can make you trip. WHAT CAN I DO IN THE BEDROOM?  Use night lights.  Make sure that you have a light by your bed that is easy to reach.  Do not use any sheets or blankets that are too big for your bed. They should not hang down onto the floor.  Have a firm chair that has side arms. You can use this for support while you get dressed.  Do not have throw rugs and other things on the floor that can make you trip. WHAT CAN I DO IN THE KITCHEN?  Clean up any spills right away.  Avoid walking on wet  floors.  Keep items that you use a lot in easy-to-reach places.  If you need to reach something above you, use a strong step stool that has a grab bar.  Keep electrical cords out of the way.  Do not use floor polish or wax that makes floors slippery. If you must use wax, use non-skid floor wax.  Do not have throw rugs and other things on the floor that can make you trip. WHAT CAN I DO WITH MY STAIRS?  Do not leave any items on the stairs.  Make sure that there are handrails on both sides of the stairs and use them. Fix handrails that are broken or loose. Make sure that handrails are as long as the stairways.  Check any carpeting to make sure that it is firmly attached to the stairs. Fix any carpet that is loose or worn.  Avoid having throw rugs at the top or bottom of the stairs. If you do have throw rugs, attach them to  the floor with carpet tape.  Make sure that you have a light switch at the top of the stairs and the bottom of the stairs. If you do not have them, ask someone to add them for you. WHAT ELSE CAN I DO TO HELP PREVENT FALLS?  Wear shoes that:  Do not have high heels.  Have rubber bottoms.  Are comfortable and fit you well.  Are closed at the toe. Do not wear sandals.  If you use a stepladder:  Make sure that it is fully opened. Do not climb a closed stepladder.  Make sure that both sides of the stepladder are locked into place.  Ask someone to hold it for you, if possible.  Clearly mark and make sure that you can see:  Any grab bars or handrails.  First and last steps.  Where the edge of each step is.  Use tools that help you move around (mobility aids) if they are needed. These include:  Canes.  Walkers.  Scooters.  Crutches.  Turn on the lights when you go into a dark area. Replace any light bulbs as soon as they burn out.  Set up your furniture so you have a clear path. Avoid moving your furniture around.  If any of your floors are uneven, fix them.  If there are any pets around you, be aware of where they are.  Review your medicines with your doctor. Some medicines can make you feel dizzy. This can increase your chance of falling. Ask your doctor what other things that you can do to help prevent falls.   This information is not intended to replace advice given to you by your health care provider. Make sure you discuss any questions you have with your health care provider.   Document Released: 04/06/2009 Document Revised: 10/25/2014 Document Reviewed: 07/15/2014 Elsevier Interactive Patient Education 2016 [place] Maintenance, Female Adopting a healthy lifestyle and getting preventive care can go a long way to promote health and wellness. Talk with your health care provider about what schedule of regular examinations is right for you. This is a  good chance for you to check in with your provider about disease prevention and staying healthy. In between checkups, there are plenty of things you can do on your own. Experts have done a lot of research about which lifestyle changes and preventive measures are most likely to keep you healthy. Ask your health care provider for more information. WEIGHT AND DIET  Eat a  healthy diet  Be sure to include plenty of vegetables, fruits, low-fat dairy products, and lean protein.  Do not eat a lot of foods high in solid fats, added sugars, or salt.  Get regular exercise. This is one of the most important things you can do for your health.  Most adults should exercise for at least 150 minutes each week. The exercise should increase your heart rate and make you sweat (moderate-intensity exercise).  Most adults should also do strengthening exercises at least twice a week. This is in addition to the moderate-intensity exercise.  Maintain a healthy weight  Body mass index (BMI) is a measurement that can be used to identify possible weight problems. It estimates body fat based on height and weight. Your health care provider can help determine your BMI and help you achieve or maintain a healthy weight.  For females 67 years of age and older:   A BMI below 18.5 is considered underweight.  A BMI of 18.5 to 24.9 is normal.  A BMI of 25 to 29.9 is considered overweight.  A BMI of 30 and above is considered obese.  Watch levels of cholesterol and blood lipids  You should start having your blood tested for lipids and cholesterol at 77 years of age, then have this test every 5 years.  You may need to have your cholesterol levels checked more often if:  Your lipid or cholesterol levels are high.  You are older than 77 years of age.  You are at high risk for heart disease.  CANCER SCREENING   Lung Cancer  Lung cancer screening is recommended for adults 20-12 years old who are at high risk for  lung cancer because of a history of smoking.  A yearly low-dose CT scan of the lungs is recommended for people who:  Currently smoke.  Have quit within the past 15 years.  Have at least a 30-pack-year history of smoking. A pack year is smoking an average of one pack of cigarettes a day for 1 year.  Yearly screening should continue until it has been 15 years since you quit.  Yearly screening should stop if you develop a health problem that would prevent you from having lung cancer treatment.  Breast Cancer  Practice breast self-awareness. This means understanding how your breasts normally appear and feel.  It also means doing regular breast self-exams. Let your health care provider know about any changes, no matter how small.  If you are in your 20s or 30s, you should have a clinical breast exam (CBE) by a health care provider every 1-3 years as part of a regular health exam.  If you are 45 or older, have a CBE every year. Also consider having a breast X-ray (mammogram) every year.  If you have a family history of breast cancer, talk to your health care provider about genetic screening.  If you are at high risk for breast cancer, talk to your health care provider about having an MRI and a mammogram every year.  Breast cancer gene (BRCA) assessment is recommended for women who have family members with BRCA-related cancers. BRCA-related cancers include:  Breast.  Ovarian.  Tubal.  Peritoneal cancers.  Results of the assessment will determine the need for genetic counseling and BRCA1 and BRCA2 testing. Cervical Cancer Your health care provider may recommend that you be screened regularly for cancer of the pelvic organs (ovaries, uterus, and vagina). This screening involves a pelvic examination, including checking for microscopic changes to the surface  of your cervix (Pap test). You may be encouraged to have this screening done every 3 years, beginning at age 91.  For women ages  43-65, health care providers may recommend pelvic exams and Pap testing every 3 years, or they may recommend the Pap and pelvic exam, combined with testing for human papilloma virus (HPV), every 5 years. Some types of HPV increase your risk of cervical cancer. Testing for HPV may also be done on women of any age with unclear Pap test results.  Other health care providers may not recommend any screening for nonpregnant women who are considered low risk for pelvic cancer and who do not have symptoms. Ask your health care provider if a screening pelvic exam is right for you.  If you have had past treatment for cervical cancer or a condition that could lead to cancer, you need Pap tests and screening for cancer for at least 20 years after your treatment. If Pap tests have been discontinued, your risk factors (such as having a new sexual partner) need to be reassessed to determine if screening should resume. Some women have medical problems that increase the chance of getting cervical cancer. In these cases, your health care provider may recommend more frequent screening and Pap tests. Colorectal Cancer  This type of cancer can be detected and often prevented.  Routine colorectal cancer screening usually begins at 77 years of age and continues through 77 years of age.  Your health care provider may recommend screening at an earlier age if you have risk factors for colon cancer.  Your health care provider may also recommend using home test kits to check for hidden blood in the stool.  A small camera at the end of a tube can be used to examine your colon directly (sigmoidoscopy or colonoscopy). This is done to check for the earliest forms of colorectal cancer.  Routine screening usually begins at age 46.  Direct examination of the colon should be repeated every 5-10 years through 77 years of age. However, you may need to be screened more often if early forms of precancerous polyps or small growths are  found. Skin Cancer  Check your skin from head to toe regularly.  Tell your health care provider about any new moles or changes in moles, especially if there is a change in a mole's shape or color.  Also tell your health care provider if you have a mole that is larger than the size of a pencil eraser.  Always use sunscreen. Apply sunscreen liberally and repeatedly throughout the day.  Protect yourself by wearing long sleeves, pants, a wide-brimmed hat, and sunglasses whenever you are outside. HEART DISEASE, DIABETES, AND HIGH BLOOD PRESSURE   High blood pressure causes heart disease and increases the risk of stroke. High blood pressure is more likely to develop in:  People who have blood pressure in the high end of the normal range (130-139/85-89 mm Hg).  People who are overweight or obese.  People who are African American.  If you are 59-54 years of age, have your blood pressure checked every 3-5 years. If you are 52 years of age or older, have your blood pressure checked every year. You should have your blood pressure measured twice--once when you are at a hospital or clinic, and once when you are not at a hospital or clinic. Record the average of the two measurements. To check your blood pressure when you are not at a hospital or clinic, you can use:  An  automated blood pressure machine at a pharmacy.  A home blood pressure monitor.  If you are between 39 years and 46 years old, ask your health care provider if you should take aspirin to prevent strokes.  Have regular diabetes screenings. This involves taking a blood sample to check your fasting blood sugar level.  If you are at a normal weight and have a low risk for diabetes, have this test once every three years after 77 years of age.  If you are overweight and have a high risk for diabetes, consider being tested at a younger age or more often. PREVENTING INFECTION  Hepatitis B  If you have a higher risk for hepatitis B,  you should be screened for this virus. You are considered at high risk for hepatitis B if:  You were born in a country where hepatitis B is common. Ask your health care provider which countries are considered high risk.  Your parents were born in a high-risk country, and you have not been immunized against hepatitis B (hepatitis B vaccine).  You have HIV or AIDS.  You use needles to inject street drugs.  You live with someone who has hepatitis B.  You have had sex with someone who has hepatitis B.  You get hemodialysis treatment.  You take certain medicines for conditions, including cancer, organ transplantation, and autoimmune conditions. Hepatitis C  Blood testing is recommended for:  Everyone born from [birth year] through 1967.  Anyone with known risk factors for hepatitis C. Sexually transmitted infections (STIs)  You should be screened for sexually transmitted infections (STIs) including gonorrhea and chlamydia if:  You are sexually active and are younger than 77 years of age.  You are older than 77 years of age and your health care provider tells you that you are at risk for this type of infection.  Your sexual activity has changed since you were last screened and you are at an increased risk for chlamydia or gonorrhea. Ask your health care provider if you are at risk.  If you do not have HIV, but are at risk, it may be recommended that you take a prescription medicine daily to prevent HIV infection. This is called pre-exposure prophylaxis (PrEP). You are considered at risk if:  You are sexually active and do not regularly use condoms or know the HIV status of your partner(s).  You take drugs by injection.  You are sexually active with a partner who has HIV. Talk with your health care provider about whether you are at high risk of being infected with HIV. If you choose to begin PrEP, you should first be tested for HIV. You should then be tested every 3 months for as long as  you are taking PrEP.  PREGNANCY   If you are premenopausal and you may become pregnant, ask your health care provider about preconception counseling.  If you may become pregnant, take 400 to 800 micrograms (mcg) of folic acid every day.  If you want to prevent pregnancy, talk to your health care provider about birth control (contraception). OSTEOPOROSIS AND MENOPAUSE   Osteoporosis is a disease in which the bones lose minerals and strength with aging. This can result in serious bone fractures. Your risk for osteoporosis can be identified using a bone density scan.  If you are 30 years of age or older, or if you are at risk for osteoporosis and fractures, ask your health care provider if you should be screened.  Ask your health care provider whether  you should take a calcium or vitamin D supplement to lower your risk for osteoporosis.  Menopause may have certain physical symptoms and risks.  Hormone replacement therapy may reduce some of these symptoms and risks. Talk to your health care provider about whether hormone replacement therapy is right for you.  HOME CARE INSTRUCTIONS   Schedule regular health, dental, and eye exams.  Stay current with your immunizations.   Do not use any tobacco products including cigarettes, chewing tobacco, or electronic cigarettes.  If you are pregnant, do not drink alcohol.  If you are breastfeeding, limit how much and how often you drink alcohol.  Limit alcohol intake to no more than 1 drink per day for nonpregnant women. One drink equals 12 ounces of beer, 5 ounces of wine, or 1 ounces of hard liquor.  Do not use street drugs.  Do not share needles.  Ask your health care provider for help if you need support or information about quitting drugs.  Tell your health care provider if you often feel depressed.  Tell your health care provider if you have ever been abused or do not feel safe at home.   This information is not intended to replace  advice given to you by your health care provider. Make sure you discuss any questions you have with your health care provider.   Document Released: 12/24/2010 Document Revised: 07/01/2014 Document Reviewed: 05/12/2013 Elsevier Interactive Patient Education [employer].

## 2015-09-26 NOTE — Progress Notes (Addendum)
Subjective:   Jamilya CORNESHIA ZELAYA is a 75 y.o. female who presents for Medicare Annual (Subsequent) preventive examination.  Review of Systems:   HRA assessment completed during visit; Malachi Carl  The Patient was informed that this wellness visit is to identify risk and educate on how to reduce risk for increase disease through lifestyle changes.   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   BMI: 24; would like to lose 7 to 8lbs  Diet; is good;  Breakfast; spinach, feta cheese and egg omelet; Tacos in the am Spouse makes a fruit bowl;  Dinner; grills; beef only 1 time per week;  Usually have fish or chicken or pork Spaghetti 1 to 2 times Likes vegetables,  Exercise;  Hip; hop class one time a week Goes dancing x 1 a week; ballroom and swing with spouse Spouse is diligent at exercising every am She walks a mile -2 times a week;    Would like to have her B12 checked; has been on B12 x 2 years;  Getting leg cramped; seems to get worse prior to getting B12  Doesn't seem to be as bad after B12; trying to drink more fluids   States she is concerned about spouse;  really having trouble doing task - financial Totaled car recently; had issues with balancing some financially  Advertising account planner by profession)  Doesn't seem to have memory issues like Alzheimer's but when he has to do a task, it takes him a long time Sleeps well;    SAFETY; uses railing as needed  Safety  reviewed for the home;  Removal of clutter clearing paths through the home,   Bathroom safety; separate shower; rails up [employer] safety; yes;  Smoke detectors yes Firearms safety / He have antique gun; put away Driving accidents and seatbelt/ no accidents  Sun protection/ sunscreen Stressors 1 -5 (3 with regard to spouse)   Medication review/ no issues  Fall assessment; no  Mobilization and Functional losses in the last year./ no Sleep patterns/ adequate   Urinary or fecal incontinence reviewed/ no   Counseling: Took foot exam; ua microalbumin and A1c out  Colonoscopy; 12/2014  / HM due 12/2016 (thought it was 5 to 7 years)  EKG: 06/2015 Mammogram 09/2011/ 02/2014/ neg/ undecided to have another  Dexa 11/2011 spine -0.1; femur -1.5; / dexa ; taking Vit d and calcium; exercising PAP 06/2009 Hearing: been screened; hearing aid in left ear Ophthalmology exam; 12/2014/ due 12/2015; goes q 6 months to retinal doctor due to macular degeneration; States she may not be able to drive at hs soon;  Is concerned regarding her eyes failing and souse's memory, which makes long term plan complicated; Spouse unwilling to consider living in senior community or other;  Immunizations none due  Advanced Directive; yes  Health advice or referrals Educated on memory loss issues and HCPOA; fup on banking to safeguard; Given resources as needed  Apt made for AWV with spouse  Current Care Team reviewed and updated        Objective:     Vitals: BP 120/80 mmHg  Ht 5\' 4"  (1.626 m)  Wt 144 lb 8 oz (65.545 kg)  BMI 24.79 kg/m2  Body mass index is 24.79 kg/(m^2).   Tobacco History  Smoking status  . Never Smoker   Smokeless tobacco  . Never Used    Comment: 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[place] but in [place] since [place] given: Yes   Past Medical History  Diagnosis Date  . Diverticulosis   . Allergic rhinitis   .  Arthritis   . Asthma   . Diet-controlled type 2 diabetes mellitus ([facility]) 09/2011 dx  . Recurrent kidney stones   . Dyslipidemia   . Macular degeneration     genetic etiology, not age-related per retinal specailist   Past Surgical History  Procedure Laterality Date  . Tonsillectomy and adenoidectomy    . Dilation and curettage of uterus    . Cataract extraction Bilateral 01/2015    L on 8/8, R on 8/22   Family History  Problem Relation Age of Onset  . Heart disease Father   . Hyperlipidemia Father   . Hypertension Father   . Colon cancer Mother   . Arthritis Mother   . Cancer Mother     colon cancer  . Diabetes Brother   . Diabetes Paternal Aunt   . Hyperlipidemia Son    History  Sexual Activity  . Sexual Activity: Not on file    Outpatient Encounter Prescriptions as of 09/26/2015  Medication Sig  . aspirin 81 MG tablet Take 81 mg by mouth daily.  . cholecalciferol (VITAMIN D) 1000 UNITS tablet Take 1,000 Units by mouth daily.  . cyanocobalamin (,VITAMIN B-12,) 1000 MCG/ML injection Inject 1 mL (1,000 mcg total) into the muscle every 30 (thirty) days.  . diphenhydrAMINE (BENADRYL) 25 MG tablet Take 25 mg by mouth every 6 (six) hours as needed.  . loperamide (IMODIUM) 2 MG capsule Take 2 mg by mouth as needed for diarrhea or loose stools. Reported on 07/26/2015  . nitroGLYCERIN (NITROSTAT) 0.4 MG SL tablet Place 1 tablet (0.4 mg total) under the tongue every 5 (five) minutes as needed for chest pain.   No facility-administered encounter medications on file as of 09/26/2015.    Activities of Daily Living No flowsheet data found.  Patient Care Team: Hoyt Koch, MD as PCP - General (Internal Medicine) Lafayette Dragon, MD as Attending Physician (Gastroenterology) Clent Jacks, MD (Ophthalmology)    Assessment:     Exercise Activities and Dietary recommendations    Goals    . Exercise 150 minutes per week (moderate activity)     Will exercise more      Fall  Risk Fall Risk  09/26/2015 09/01/2014 05/31/2013 05/19/2012  Falls in the past year? No No No No   Depression Screen PHQ 2/9 Scores 09/26/2015 09/01/2014 05/31/2013 05/19/2012  PHQ - 2 Score 3 0 1 0     Cognitive Testing No flowsheet data found.   AD8 score 0 Given informant information and information regarding memory decline;   Immunization History  Administered Date(s) Administered  . Hepatitis A, Adult 02/24/2013  . Influenza Split 03/08/2014  . Influenza, High Dose Seasonal PF 04/18/2015  . Influenza,inj,Quad PF,36+ Mos 02/24/2013  . Pneumococcal Conjugate-13 05/31/2013  . Pneumococcal-Unspecified 09/22/2008  . Td 06/24/2009  . Zoster 09/09/2007   Screening  Tests Health Maintenance  Topic Date Due  . FOOT EXAM  09/01/2015  . URINE MICROALBUMIN  09/01/2015  . HEMOGLOBIN A1C  09/04/2015  . OPHTHALMOLOGY EXAM  12/28/2015  . INFLUENZA VACCINE  01/23/2016  . COLONOSCOPY  01/13/2017  . TETANUS/TDAP  06/25/2019  . DEXA SCAN  Completed  . ZOSTAVAX  Completed  . PNA vac Low Risk Adult  Completed      Plan:   Mammogram optional; not sure she will continue with no hx of breast cancer  Can discuss future dexa with Dr. Sharlet Salina does not plan to take medication but will review site osteoporosis site for information  I will fup on B12 and night cramping   Goals; will increase her exercise   During the course of the visit the patient was educated and counseled about the following appropriate screening and preventive services:   Vaccines to include Pneumoccal, Influenza, Hepatitis B, Td, Zostavax, HCV/none due  Electrocardiogram 06/2015  Cardiovascular Disease/ cardiology seeing; r/o for anxiety due to health of spouse  Colorectal cancer screening ? Due 12/2016; the patient felt it was due 0223; will fup  Bone density screening ; 2013; will consider fup   Diabetes screening/ states she does not have dm; pre-diabetes reveiwed  Glaucoma screening/ followed for macular  degeneration  Mammography/ 2015; not sure she will repeat Nutrition counseling / discussed weight loss; via exercise; diet fairly healthy  Patient Instructions (the written plan) was given to the patient.    States she has generalized leg cramping approx 1 week prior to b12; Would like to fup with Dr Sharlet Salina.   Wynetta Fines, RN  09/26/2015     Medical screening examination/treatment/procedure(s) were performed by non-physician practitioner and as supervising provider I was immediately available for consultation/collaboration. I agree with above. Mauricio Po, FNP

## 2015-09-28 ENCOUNTER — Encounter: Payer: Self-pay | Admitting: Internal Medicine

## 2015-11-07 ENCOUNTER — Encounter: Payer: Self-pay | Admitting: Internal Medicine

## 2015-11-10 ENCOUNTER — Encounter: Payer: Self-pay | Admitting: Internal Medicine

## 2015-11-10 ENCOUNTER — Ambulatory Visit (INDEPENDENT_AMBULATORY_CARE_PROVIDER_SITE_OTHER): Payer: Medicare Other | Admitting: Internal Medicine

## 2015-11-10 VITALS — BP 124/62 | HR 76 | Temp 98.7°F | Resp 12 | Ht 64.0 in | Wt 144.8 lb

## 2015-11-10 DIAGNOSIS — E119 Type 2 diabetes mellitus without complications: Secondary | ICD-10-CM

## 2015-11-10 DIAGNOSIS — E538 Deficiency of other specified B group vitamins: Secondary | ICD-10-CM

## 2015-11-10 DIAGNOSIS — Z0000 Encounter for general adult medical examination without abnormal findings: Secondary | ICD-10-CM | POA: Diagnosis not present

## 2015-11-10 DIAGNOSIS — E785 Hyperlipidemia, unspecified: Secondary | ICD-10-CM | POA: Diagnosis not present

## 2015-11-10 MED ORDER — CYANOCOBALAMIN 1000 MCG/ML IJ SOLN: 1000.0000 ug | INTRAMUSCULAR | Status: DC

## 2015-11-10 NOTE — Patient Instructions (Signed)
We will have you come back to the lab fasting sometime next week.   Increase the vitamin B12 shots up to every 3 weeks instead of every month.   Come back next year for the check up and call us sooner if you have any problems or questions.   The benadryl is safe to take if needed in the evening for sleep but another safer alternative is called melatonin which you take before bedtime to help to calm down the brain. It is a natural substance made in the brain and so boosting levels helps you to get to sleep more naturally. It can take 1 week of taking for it to take effect.   Health Maintenance, Female Adopting a healthy lifestyle and getting preventive care can go a long way to promote health and wellness. Talk with your health care provider about what schedule of regular examinations is right for you. This is a good chance for you to check in with your provider about disease prevention and staying healthy. In between checkups, there are plenty of things you can do on your own. Experts have done a lot of research about which lifestyle changes and preventive measures are most likely to keep you healthy. Ask your health care provider for more information. WEIGHT AND DIET  Eat a healthy diet  Be sure to include plenty of vegetables, fruits, low-fat dairy products, and lean protein.  Do not eat a lot of foods high in solid fats, added sugars, or salt.  Get regular exercise. This is one of the most important things you can do for your health.  Most adults should exercise for at least 150 minutes each week. The exercise should increase your heart rate and make you sweat (moderate-intensity exercise).  Most adults should also do strengthening exercises at least twice a week. This is in addition to the moderate-intensity exercise.  Maintain a healthy weight  Body mass index (BMI) is a measurement that can be used to identify possible weight problems. It estimates body fat based on height and weight.  Your health care provider can help determine your BMI and help you achieve or maintain a healthy weight.  For females 55 years of age and older:   A BMI below 18.5 is considered underweight.  A BMI of 18.5 to 24.9 is normal.  A BMI of 25 to 29.9 is considered overweight.  A BMI of 30 and above is considered obese.  Watch levels of cholesterol and blood lipids  You should start having your blood tested for lipids and cholesterol at 77 years of age, then have this test every 5 years.  You may need to have your cholesterol levels checked more often if:  Your lipid or cholesterol levels are high.  You are older than 77 years of age.  You are at high risk for heart disease.  CANCER SCREENING   Lung Cancer  Lung cancer screening is recommended for adults 28-28 years old who are at high risk for lung cancer because of a history of smoking.  A yearly low-dose CT scan of the lungs is recommended for people who:  Currently smoke.  Have quit within the past 15 years.  Have at least a 30-pack-year history of smoking. A pack year is smoking an average of one pack of cigarettes a day for 1 year.  Yearly screening should continue until it has been 15 years since you quit.  Yearly screening should stop if you develop a health problem that would prevent  you from having lung cancer treatment.  Breast Cancer  Practice breast self-awareness. This means understanding how your breasts normally appear and feel.  It also means doing regular breast self-exams. Let your health care provider know about any changes, no matter how small.  If you are in your 20s or 30s, you should have a clinical breast exam (CBE) by a health care provider every 1-3 years as part of a regular health exam.  If you are 61 or older, have a CBE every year. Also consider having a breast X-ray (mammogram) every year.  If you have a family history of breast cancer, talk to your health care provider about genetic  screening.  If you are at high risk for breast cancer, talk to your health care provider about having an MRI and a mammogram every year.  Breast cancer gene (BRCA) assessment is recommended for women who have family members with BRCA-related cancers. BRCA-related cancers include:  Breast.  Ovarian.  Tubal.  Peritoneal cancers.  Results of the assessment will determine the need for genetic counseling and BRCA1 and BRCA2 testing. Cervical Cancer Your health care provider may recommend that you be screened regularly for cancer of the pelvic organs (ovaries, uterus, and vagina). This screening involves a pelvic examination, including checking for microscopic changes to the surface of your cervix (Pap test). You may be encouraged to have this screening done every 3 years, beginning at age 80.  For women ages 74-65, health care providers may recommend pelvic exams and Pap testing every 3 years, or they may recommend the Pap and pelvic exam, combined with testing for human papilloma virus (HPV), every 5 years. Some types of HPV increase your risk of cervical cancer. Testing for HPV may also be done on women of any age with unclear Pap test results.  Other health care providers may not recommend any screening for nonpregnant women who are considered low risk for pelvic cancer and who do not have symptoms. Ask your health care provider if a screening pelvic exam is right for you.  If you have had past treatment for cervical cancer or a condition that could lead to cancer, you need Pap tests and screening for cancer for at least 20 years after your treatment. If Pap tests have been discontinued, your risk factors (such as having a new sexual partner) need to be reassessed to determine if screening should resume. Some women have medical problems that increase the chance of getting cervical cancer. In these cases, your health care provider may recommend more frequent screening and Pap tests. Colorectal  Cancer  This type of cancer can be detected and often prevented.  Routine colorectal cancer screening usually begins at 77 years of age and continues through 77 years of age.  Your health care provider may recommend screening at an earlier age if you have risk factors for colon cancer.  Your health care provider may also recommend using home test kits to check for hidden blood in the stool.  A small camera at the end of a tube can be used to examine your colon directly (sigmoidoscopy or colonoscopy). This is done to check for the earliest forms of colorectal cancer.  Routine screening usually begins at age 26.  Direct examination of the colon should be repeated every 5-10 years through 77 years of age. However, you may need to be screened more often if early forms of precancerous polyps or small growths are found. Skin Cancer  Check your skin from head to  toe regularly.  Tell your health care provider about any new moles or changes in moles, especially if there is a change in a mole's shape or color.  Also tell your health care provider if you have a mole that is larger than the size of a pencil eraser.  Always use sunscreen. Apply sunscreen liberally and repeatedly throughout the day.  Protect yourself by wearing long sleeves, pants, a wide-brimmed hat, and sunglasses whenever you are outside. HEART DISEASE, DIABETES, AND HIGH BLOOD PRESSURE   High blood pressure causes heart disease and increases the risk of stroke. High blood pressure is more likely to develop in:  People who have blood pressure in the high end of the normal range (130-139/85-89 mm Hg).  People who are overweight or obese.  People who are African American.  If you are 62-50 years of age, have your blood pressure checked every 3-5 years. If you are 62 years of age or older, have your blood pressure checked every year. You should have your blood pressure measured twice--once when you are at a hospital or clinic,  and once when you are not at a hospital or clinic. Record the average of the two measurements. To check your blood pressure when you are not at a hospital or clinic, you can use:  An automated blood pressure machine at a pharmacy.  A home blood pressure monitor.  If you are between 27 years and 55 years old, ask your health care provider if you should take aspirin to prevent strokes.  Have regular diabetes screenings. This involves taking a blood sample to check your fasting blood sugar level.  If you are at a normal weight and have a low risk for diabetes, have this test once every three years after 77 years of age.  If you are overweight and have a high risk for diabetes, consider being tested at a younger age or more often. PREVENTING INFECTION  Hepatitis B  If you have a higher risk for hepatitis B, you should be screened for this virus. You are considered at high risk for hepatitis B if:  You were born in a country where hepatitis B is common. Ask your health care provider which countries are considered high risk.  Your parents were born in a high-risk country, and you have not been immunized against hepatitis B (hepatitis B vaccine).  You have HIV or AIDS.  You use needles to inject street drugs.  You live with someone who has hepatitis B.  You have had sex with someone who has hepatitis B.  You get hemodialysis treatment.  You take certain medicines for conditions, including cancer, organ transplantation, and autoimmune conditions. Hepatitis C  Blood testing is recommended for:  Everyone born from [birth year] through 1967.  Anyone with known risk factors for hepatitis C. Sexually transmitted infections (STIs)  You should be screened for sexually transmitted infections (STIs) including gonorrhea and chlamydia if:  You are sexually active and are younger than 77 years of age.  You are older than 77 years of age and your health care provider tells you that you are at risk  for this type of infection.  Your sexual activity has changed since you were last screened and you are at an increased risk for chlamydia or gonorrhea. Ask your health care provider if you are at risk.  If you do not have HIV, but are at risk, it may be recommended that you take a prescription medicine daily to prevent HIV infection. This  is called pre-exposure prophylaxis (PrEP). You are considered at risk if:  You are sexually active and do not regularly use condoms or know the HIV status of your partner(s).  You take drugs by injection.  You are sexually active with a partner who has HIV. Talk with your health care provider about whether you are at high risk of being infected with HIV. If you choose to begin PrEP, you should first be tested for HIV. You should then be tested every 3 months for as long as you are taking PrEP.  PREGNANCY   If you are premenopausal and you may become pregnant, ask your health care provider about preconception counseling.  If you may become pregnant, take 400 to 800 micrograms (mcg) of folic acid every day.  If you want to prevent pregnancy, talk to your health care provider about birth control (contraception). OSTEOPOROSIS AND MENOPAUSE   Osteoporosis is a disease in which the bones lose minerals and strength with aging. This can result in serious bone fractures. Your risk for osteoporosis can be identified using a bone density scan.  If you are 10 years of age or older, or if you are at risk for osteoporosis and fractures, ask your health care provider if you should be screened.  Ask your health care provider whether you should take a calcium or vitamin D supplement to lower your risk for osteoporosis.  Menopause may have certain physical symptoms and risks.  Hormone replacement therapy may reduce some of these symptoms and risks. Talk to your health care provider about whether hormone replacement therapy is right for you.  HOME CARE INSTRUCTIONS    Schedule regular health, dental, and eye exams.  Stay current with your immunizations.   Do not use any tobacco products including cigarettes, chewing tobacco, or electronic cigarettes.  If you are pregnant, do not drink alcohol.  If you are breastfeeding, limit how much and how often you drink alcohol.  Limit alcohol intake to no more than 1 drink per day for nonpregnant women. One drink equals 12 ounces of beer, 5 ounces of wine, or 1 ounces of hard liquor.  Do not use street drugs.  Do not share needles.  Ask your health care provider for help if you need support or information about quitting drugs.  Tell your health care provider if you often feel depressed.  Tell your health care provider if you have ever been abused or do not feel safe at home.   This information is not intended to replace advice given to you by your health care provider. Make sure you discuss any questions you have with your health care provider.   Document Released: 12/24/2010 Document Revised: 07/01/2014 Document Reviewed: 05/12/2013 Elsevier Interactive Patient Education [employer].

## 2015-11-10 NOTE — Progress Notes (Signed)
Pre visit review using our clinic review tool, if applicable. No additional management support is needed unless otherwise documented below in the visit note. 

## 2015-11-12 DIAGNOSIS — E538 Deficiency of other specified B group vitamins: Secondary | ICD-10-CM | POA: Insufficient documentation

## 2015-11-12 DIAGNOSIS — Z0000 Encounter for general adult medical examination without abnormal findings: Secondary | ICD-10-CM | POA: Insufficient documentation

## 2015-11-12 NOTE — Assessment & Plan Note (Addendum)
Checking HgA1c today. Adjust as needed. Reminded about yearly eye exam and routine foot care. Will do foot exam and urine next visit per patient preference.

## 2015-11-12 NOTE — Assessment & Plan Note (Signed)
Checking B12 level after shot to see efficacy. Increase to every 3 weeks as she is getting symptoms around then until shot 1 week later.

## 2015-11-12 NOTE — Assessment & Plan Note (Signed)
Immunizations are up to date and colonoscopy and mammogram up to date. Bone density done. Counseled on home safety and fall prevention. Given 10 year screening recommendations.

## 2015-11-12 NOTE — Assessment & Plan Note (Signed)
Not on meds due to statin reaction. Checking lipid panel and will talk to her about lipid clinic if still high.

## 2015-11-12 NOTE — Progress Notes (Signed)
   Subjective:    Patient ID: Erica [name], female    DOB: Jul 23, 1940, 75 y.o.   MRN: [id number]  HPI The patient is a 75 YO female coming in for physical and no new concerns. Wants to review her conditions and meds.   PMH, [facility], social history reviewed and updated.   Review of Systems  Constitutional: Positive for fatigue. Negative for chills, activity change, appetite change and unexpected weight change.  HENT: Negative.   Eyes: Negative.   Respiratory: Negative for cough, chest tightness, shortness of breath and wheezing.   Cardiovascular: Negative for chest pain, palpitations and leg swelling.  Gastrointestinal: Negative for nausea, abdominal pain, diarrhea, constipation and abdominal distention.  Musculoskeletal: Negative.   Skin: Negative.   Neurological: Negative.   Psychiatric/Behavioral: Negative.       Objective:   Physical Exam  Constitutional: She is oriented to person, place, and time. She appears well-developed and well-nourished.  HENT:  Head: Normocephalic and atraumatic.  Eyes: EOM are normal.  Neck: Normal range of motion. No JVD present. No thyromegaly present.  Cardiovascular: Normal rate and regular rhythm.   No murmur heard. Carotids without murmur  Pulmonary/Chest: Effort normal and breath sounds normal. No respiratory distress. She has no wheezes. She has no rales.  Abdominal: Soft. Bowel sounds are normal. She exhibits no distension. There is no tenderness. There is no rebound.  Musculoskeletal: She exhibits no edema.  Lymphadenopathy:    She has no cervical adenopathy.  Neurological: She is alert and oriented to person, place, and time.  Skin: Skin is warm and dry.  Psychiatric: She has a normal mood and affect.   Filed Vitals:   11/10/15 1536  BP: 124/62  Pulse: 76  Temp: 98.7 F (37.1 C)  TempSrc: Oral  Resp: 12  Height: 5\' 4"  (1.626 m)  Weight: 144 lb 12.8 oz (65.681 kg)  SpO2: 94%      Assessment & Plan:

## 2015-11-13 ENCOUNTER — Encounter: Payer: Self-pay | Admitting: Internal Medicine

## 2015-11-16 ENCOUNTER — Encounter: Payer: Self-pay | Admitting: Internal Medicine

## 2015-11-16 ENCOUNTER — Ambulatory Visit (INDEPENDENT_AMBULATORY_CARE_PROVIDER_SITE_OTHER): Payer: Medicare Other | Admitting: Internal Medicine

## 2015-11-16 VITALS — BP 136/76 | HR 68 | Temp 98.0°F | Resp 12 | Ht 64.0 in | Wt 146.0 lb

## 2015-11-16 DIAGNOSIS — L439 Lichen planus, unspecified: Secondary | ICD-10-CM | POA: Insufficient documentation

## 2015-11-16 MED ORDER — CLOBETASOL PROPIONATE 0.05 % EX OINT: TOPICAL_OINTMENT | CUTANEOUS | Status: DC

## 2015-11-16 NOTE — Patient Instructions (Signed)
We have sent in the clobetasol ointment for the area that is itching. Use thin film twice daily for 2 weeks, then daily for 2 months, then 3 times per week for 2 months, then once a week for 2 months then stop.  If the itching comes back after stopping the ointment call us for advice.

## 2015-11-16 NOTE — Progress Notes (Signed)
   Subjective:    Patient ID: Erica [name], female    DOB: June 01, 1941, 75 y.o.   MRN: [id number]  HPI The patient is a 75 YO female coming in for vaginal itching. No new sexual partners. Going on for about 6-7 months. Had talked to previous PCP about it and given cream which did not help much. Not having vaginal dryness. No discharge or burning. No pain with urination.   Review of Systems  Constitutional: Negative for fever, activity change, appetite change, fatigue and unexpected weight change.  Respiratory: Negative.   Cardiovascular: Negative.   Gastrointestinal: Negative.   Genitourinary: Negative for frequency, vaginal bleeding, vaginal discharge, difficulty urinating, genital sores, pelvic pain and dyspareunia.  Musculoskeletal: Negative.   Skin:       itching      Objective:   Physical Exam  Constitutional: She is oriented to person, place, and time. She appears well-developed and well-nourished.  HENT:  Head: Normocephalic and atraumatic.  Eyes: EOM are normal.  Neck: Normal range of motion.  Cardiovascular: Normal rate and regular rhythm.   Pulmonary/Chest: Effort normal and breath sounds normal.  Abdominal: Soft.  Genitourinary:  Vagina moist and no signs of discharge or yeast infection, some changes on the labia consistent with lichen planus  Neurological: She is alert and oriented to person, place, and time.  Skin: Skin is warm and dry.   Filed Vitals:   11/16/15 0955  BP: 136/76  Pulse: 68  Temp: 98 F (36.7 C)  TempSrc: Oral  Resp: 12  Height: 5\' 4"  (1.626 m)  Weight: 146 lb (66.225 kg)  SpO2: 97%      Assessment & Plan:

## 2015-11-16 NOTE — Assessment & Plan Note (Signed)
Rx for clobetasol ointment with treatment and taper over the next 4-5 months.

## 2015-11-16 NOTE — Progress Notes (Signed)
Pre visit review using our clinic review tool, if applicable. No additional management support is needed unless otherwise documented below in the visit note. 

## 2015-11-22 ENCOUNTER — Other Ambulatory Visit (INDEPENDENT_AMBULATORY_CARE_PROVIDER_SITE_OTHER): Payer: Medicare Other

## 2015-11-22 DIAGNOSIS — Z0000 Encounter for general adult medical examination without abnormal findings: Secondary | ICD-10-CM

## 2015-11-22 LAB — LIPID PANEL
Cholesterol: 180 mg/dL (ref 0–200)
HDL: 42.1 mg/dL (ref >39.00)
LDL Cholesterol: 121 mg/dL — ABNORMAL HIGH (ref 0–99)
NonHDL: 137.44
Total CHOL/HDL Ratio: 4
Triglycerides: 80 mg/dL (ref 0.0–149.0)
VLDL: 16 mg/dL (ref 0.0–40.0)

## 2015-11-22 LAB — CBC
HCT: 41 % (ref 36.0–46.0)
Hemoglobin: 13.8 g/dL (ref 12.0–15.0)
MCHC: 33.6 g/dL (ref 30.0–36.0)
MCV: 89.1 fl (ref 78.0–100.0)
Platelets: 281 10*3/uL (ref 150.0–400.0)
RBC: 4.6 Mil/uL (ref 3.87–5.11)
RDW: 13.6 % (ref 11.5–15.5)
WBC: 7.7 10*3/uL (ref 4.0–10.5)

## 2015-11-22 LAB — COMPREHENSIVE METABOLIC PANEL
ALT: 13 U/L (ref 0–35)
AST: 12 U/L (ref 0–37)
Albumin: 4 g/dL (ref 3.5–5.2)
Alkaline Phosphatase: 59 U/L (ref 39–117)
BUN: 18 mg/dL (ref 6–23)
CO2: 26 mEq/L (ref 19–32)
Calcium: 9.1 mg/dL (ref 8.4–10.5)
Chloride: 108 mEq/L (ref 96–112)
Creatinine, Ser: 0.81 mg/dL (ref 0.40–1.20)
GFR: 72.82 mL/min (ref >60.00)
Glucose, Bld: 103 mg/dL — ABNORMAL HIGH (ref 70–99)
Potassium: 4.5 mEq/L (ref 3.5–5.1)
Sodium: 139 mEq/L (ref 135–145)
Total Bilirubin: 0.3 mg/dL (ref 0.2–1.2)
Total Protein: 6.3 g/dL (ref 6.0–8.3)

## 2015-11-22 LAB — VITAMIN B12: Vitamin B-12: 342 pg/mL (ref 211–911)

## 2015-11-22 LAB — HEMOGLOBIN A1C: Hgb A1c MFr Bld: 6.2 % (ref 4.6–6.5)

## 2016-01-15 ENCOUNTER — Encounter: Payer: Self-pay | Admitting: Internal Medicine

## 2016-01-15 DIAGNOSIS — R5383 Other fatigue: Secondary | ICD-10-CM

## 2016-01-16 ENCOUNTER — Other Ambulatory Visit (INDEPENDENT_AMBULATORY_CARE_PROVIDER_SITE_OTHER): Payer: Medicare Other

## 2016-01-16 DIAGNOSIS — R5383 Other fatigue: Secondary | ICD-10-CM

## 2016-01-16 LAB — T4, FREE: Free T4: 0.7 ng/dL (ref 0.60–1.60)

## 2016-01-16 LAB — TSH: TSH: 1.35 u[IU]/mL (ref 0.35–4.50)

## 2016-01-17 ENCOUNTER — Encounter: Payer: Self-pay | Admitting: Internal Medicine

## 2016-01-17 DIAGNOSIS — E038 Other specified hypothyroidism: Secondary | ICD-10-CM

## 2016-01-19 MED ORDER — LEVOTHYROXINE SODIUM 50 MCG PO TABS: 50.0000 ug | ORAL_TABLET | Freq: Every day | ORAL | 3 refills | Status: DC

## 2016-02-10 ENCOUNTER — Encounter: Payer: Self-pay | Admitting: Internal Medicine

## 2016-04-11 ENCOUNTER — Encounter: Payer: Self-pay | Admitting: Geriatric Medicine

## 2016-05-07 ENCOUNTER — Encounter: Payer: Self-pay | Admitting: Internal Medicine

## 2016-05-09 ENCOUNTER — Encounter: Payer: Self-pay | Admitting: Internal Medicine

## 2016-05-09 ENCOUNTER — Ambulatory Visit (INDEPENDENT_AMBULATORY_CARE_PROVIDER_SITE_OTHER): Payer: Medicare Other | Admitting: Internal Medicine

## 2016-05-09 DIAGNOSIS — Q383 Other congenital malformations of tongue: Secondary | ICD-10-CM

## 2016-05-09 NOTE — Assessment & Plan Note (Signed)
Suspect distended vein at the base of the tongue, benign and reassurance given.

## 2016-05-09 NOTE — Progress Notes (Signed)
Pre visit review using our clinic review tool, if applicable. No additional management support is needed unless otherwise documented below in the visit note. 

## 2016-05-09 NOTE — Progress Notes (Signed)
   Subjective:    Patient ID: [name], female    DOB: February 01, 1941, 75 y.o.   MRN: [id number]  HPI The patient is a 75 YO female coming in for a lump on her tongue. She noticed it about 1 week ago but it may have been there for some time. It is not painful. No change to taste. No recent dental procedure or teeth with problems at this time. No fevers or chills. No allergies now.   Review of Systems  Constitutional: Negative for activity change, appetite change, fatigue, fever and unexpected weight change.  HENT:       Bump on tongue  Eyes: Negative.   Respiratory: Negative.   Cardiovascular: Negative.   Gastrointestinal: Negative.       Objective:   Physical Exam  Constitutional: She appears well-developed and well-nourished.  HENT:  Head: Normocephalic and atraumatic.  Tongue normal in color, no plaque, prominent vein on the base of the tongue, no mole or black color.   Eyes: EOM are normal.  Neck: Normal range of motion.  Cardiovascular: Normal rate and regular rhythm.   Pulmonary/Chest: Effort normal. No respiratory distress. She has no wheezes. She has no rales.  Abdominal: Soft. She exhibits no distension. There is no tenderness. There is no rebound.   Vitals:   05/09/16 1107  BP: 138/60  Pulse: 79  Resp: 12  Temp: 98.3 F (36.8 C)  TempSrc: Oral  SpO2: 96%  Weight: 147 lb (66.7 kg)  Height: 5\' 4"  (1.626 m)      Assessment & Plan:

## 2016-05-09 NOTE — Patient Instructions (Signed)
This is a vein on the tongue and is not harmful.

## 2016-07-30 ENCOUNTER — Encounter: Payer: Self-pay | Admitting: Internal Medicine

## 2016-08-05 ENCOUNTER — Encounter: Payer: Self-pay | Admitting: Internal Medicine

## 2016-08-05 ENCOUNTER — Ambulatory Visit (INDEPENDENT_AMBULATORY_CARE_PROVIDER_SITE_OTHER): Payer: Medicare Other | Admitting: Internal Medicine

## 2016-08-05 DIAGNOSIS — R1310 Dysphagia, unspecified: Secondary | ICD-10-CM

## 2016-08-05 DIAGNOSIS — R1319 Other dysphagia: Secondary | ICD-10-CM

## 2016-08-05 NOTE — Progress Notes (Signed)
   Subjective:    Patient ID: Erica [name], female    DOB: 11-11-1940, 76 y.o.   MRN: [id number]  HPI The patient is a 76 YO female coming in for swallowing problems. She has had some intermittent dysphagia to solids for several years. She is able to avoid problems if she chews food well. For the last several weeks she is having some problems with solids and pills even if she chews well. During the same time she is having some extra allergies or mild cold symptoms. She does not like to take medicines if not needed. She does take zyrtec in the spring and fall for allergies but has not tried it for this. She denies much acid reflux lately but has had some. She is able to swallow the foods with some grapes which are not well chewed but liquids alone are not enough to clear the blockage.   Review of Systems  Constitutional: Negative for activity change, appetite change, chills, fatigue, fever and unexpected weight change.  HENT: Positive for congestion, postnasal drip, rhinorrhea and trouble swallowing. Negative for ear discharge, ear pain, sinus pain, sinus pressure, sore throat, tinnitus and voice change.   Eyes: Negative.   Respiratory: Negative.   Cardiovascular: Negative for chest pain, palpitations and leg swelling.  Gastrointestinal: Negative for abdominal distention, abdominal pain, constipation, diarrhea, nausea and vomiting.  Musculoskeletal: Negative.   Skin: Negative.       Objective:   Physical Exam  Constitutional: She is oriented to person, place, and time. She appears well-developed and well-nourished.  HENT:  Head: Normocephalic and atraumatic.  Oropharynx with redness and clear drainage, nose without crusting.   Eyes: EOM are normal.  Neck: Normal range of motion. Neck supple. No JVD present. No thyromegaly present.  Cardiovascular: Normal rate and regular rhythm.   Pulmonary/Chest: Effort normal and breath sounds normal. No respiratory distress. She has no wheezes. She has  no rales. She exhibits no tenderness.  Abdominal: Soft. Bowel sounds are normal. She exhibits no distension. There is no tenderness. There is no rebound.  Lymphadenopathy:    She has no cervical adenopathy.  Neurological: She is alert and oriented to person, place, and time.  Skin: Skin is warm and dry.   Vitals:   08/05/16 1341  BP: 118/62  Pulse: 78  Temp: 98.4 F (36.9 C)  TempSrc: Oral  SpO2: 98%  Weight: 146 lb 12 oz (66.6 kg)  Height: 5\' 4"  (1.626 m)      Assessment & Plan:

## 2016-08-05 NOTE — Patient Instructions (Addendum)
Start back to taking the zyrtec daily for several weeks.   If the swallowing does not go back to normal let us know.   Turmeric is a good option to try as well as glucosamine.

## 2016-08-05 NOTE — Progress Notes (Signed)
Pre visit review using our clinic review tool, if applicable. No additional management support is needed unless otherwise documented below in the visit note. 

## 2016-08-07 DIAGNOSIS — R1310 Dysphagia, unspecified: Secondary | ICD-10-CM | POA: Insufficient documentation

## 2016-08-07 NOTE — Assessment & Plan Note (Signed)
Likely some longstanding stenosis causing the solid food dysphagia made worse by post nasal drip. Advised to resume her zyrtec for the next several weeks to see if there is improvement. If not will need barium swallow test.

## 2016-08-26 ENCOUNTER — Encounter: Payer: Self-pay | Admitting: Internal Medicine

## 2016-08-28 ENCOUNTER — Encounter: Payer: Self-pay | Admitting: Internal Medicine

## 2016-08-28 ENCOUNTER — Other Ambulatory Visit (INDEPENDENT_AMBULATORY_CARE_PROVIDER_SITE_OTHER): Payer: Medicare Other

## 2016-08-28 ENCOUNTER — Ambulatory Visit (INDEPENDENT_AMBULATORY_CARE_PROVIDER_SITE_OTHER): Payer: Medicare Other | Admitting: Internal Medicine

## 2016-08-28 VITALS — BP 118/60 | HR 77 | Temp 97.9°F | Ht 64.0 in | Wt 147.0 lb

## 2016-08-28 DIAGNOSIS — E038 Other specified hypothyroidism: Secondary | ICD-10-CM

## 2016-08-28 DIAGNOSIS — R0602 Shortness of breath: Secondary | ICD-10-CM

## 2016-08-28 LAB — BASIC METABOLIC PANEL
BUN: 18 mg/dL (ref 6–23)
CO2: 29 mEq/L (ref 19–32)
Calcium: 9.6 mg/dL (ref 8.4–10.5)
Chloride: 106 mEq/L (ref 96–112)
Creatinine, Ser: 1.25 mg/dL — ABNORMAL HIGH (ref 0.40–1.20)
GFR: 44.05 mL/min — ABNORMAL LOW (ref >60.00)
Glucose, Bld: 102 mg/dL — ABNORMAL HIGH (ref 70–99)
Potassium: 4.6 mEq/L (ref 3.5–5.1)
Sodium: 141 mEq/L (ref 135–145)

## 2016-08-28 LAB — D-DIMER, QUANTITATIVE: D-Dimer, Quant: 0.32 mcg/mL FEU (ref <0.50)

## 2016-08-28 LAB — BRAIN NATRIURETIC PEPTIDE: Pro B Natriuretic peptide (BNP): 18 pg/mL (ref 0.0–100.0)

## 2016-08-28 LAB — TSH: TSH: 1.82 u[IU]/mL (ref 0.35–4.50)

## 2016-08-28 LAB — T4, FREE: Free T4: 0.94 ng/dL (ref 0.60–1.60)

## 2016-08-28 NOTE — Progress Notes (Signed)
   Subjective:    Patient ID: Erica [name], female    DOB: Feb 13, 1941, 76 y.o.   MRN: [id number]  HPI The patient is a 76 YO female coming in for new SOB. Has been going on for 1 week or so. Hits her when she is exerting and feels like she cannot fill up her lungs. She recently flew back and forth to [facility] within the last 2 week. Denies swelling in her legs or pain in her legs. She denies chest pains or palpitations. She denies having breathing problems with lying down. No cough or nasal congestion or cold symptoms. Is under more stress lately and he is not sure if that is related to her breathing.   Review of Systems  Constitutional: Negative for activity change, appetite change, fatigue, fever and unexpected weight change.  HENT: Negative.   Eyes: Negative.   Respiratory: Positive for chest tightness and shortness of breath. Negative for cough and wheezing.   Cardiovascular: Negative for chest pain, palpitations and leg swelling.  Gastrointestinal: Negative.   Musculoskeletal: Positive for arthralgias. Negative for back pain, gait problem, myalgias, neck pain and neck stiffness.       Not changed  Skin: Negative.   Neurological: Negative.   Psychiatric/Behavioral: The patient is nervous/anxious.        More stressed      Objective:   Physical Exam  Constitutional: She is oriented to person, place, and time. She appears well-developed and well-nourished.  HENT:  Head: Normocephalic and atraumatic.  Right Ear: External ear normal.  Left Ear: External ear normal.  Mouth/Throat: Oropharynx is clear and moist.  Eyes: EOM are normal.  Neck: Normal range of motion. No JVD present.  Cardiovascular: Normal rate and regular rhythm.   Pulmonary/Chest: Effort normal and breath sounds normal. No respiratory distress. She has no wheezes. She has no rales.  Abdominal: Soft. She exhibits no distension. There is no tenderness. There is no rebound and no guarding.  Musculoskeletal: She  exhibits no edema.  No calf tenderness  Lymphadenopathy:    She has no cervical adenopathy.  Neurological: She is alert and oriented to person, place, and time. Coordination normal.  Skin: Skin is warm and dry.  Psychiatric: She has a normal mood and affect.   Vitals:   08/28/16 0907  BP: 118/60  Pulse: 77  Temp: 97.9 F (36.6 C)  TempSrc: Oral  SpO2: 99%  Weight: 147 lb (66.7 kg)  Height: 5\' 4"  (1.626 m)      Assessment & Plan:

## 2016-08-28 NOTE — Patient Instructions (Signed)
We will check the blood work today and call you back with the results.    

## 2016-08-28 NOTE — Progress Notes (Signed)
Pre visit review using our clinic review tool, if applicable. No additional management support is needed unless otherwise documented below in the visit note. 

## 2016-08-28 NOTE — Assessment & Plan Note (Signed)
Given the recent long travel to [place] will check d-dimer to rule out PE (otherwise low risk). Lungs clear on exam and no symptoms to suggest viral etiology. Checking BNP to rule out edema although symptoms are not suggestive. Anxiety would be diagnosis of exclusion.

## 2016-09-08 ENCOUNTER — Encounter: Payer: Self-pay | Admitting: Internal Medicine

## 2016-10-15 ENCOUNTER — Ambulatory Visit (INDEPENDENT_AMBULATORY_CARE_PROVIDER_SITE_OTHER)
Admission: RE | Admit: 2016-10-15 | Discharge: 2016-10-15 | Disposition: A | Discharge status: Home / Self Care | Payer: Medicare Other | Source: Ambulatory Visit | Attending: Internal Medicine | Admitting: Internal Medicine

## 2016-10-15 ENCOUNTER — Ambulatory Visit (INDEPENDENT_AMBULATORY_CARE_PROVIDER_SITE_OTHER): Payer: Medicare Other | Admitting: Internal Medicine

## 2016-10-15 ENCOUNTER — Encounter: Payer: Self-pay | Admitting: Internal Medicine

## 2016-10-15 ENCOUNTER — Other Ambulatory Visit (INDEPENDENT_AMBULATORY_CARE_PROVIDER_SITE_OTHER): Payer: Medicare Other

## 2016-10-15 VITALS — BP 130/60 | HR 85 | Temp 98.3°F | Resp 14 | Ht 64.0 in | Wt 146.0 lb

## 2016-10-15 DIAGNOSIS — M545 Low back pain, unspecified: Secondary | ICD-10-CM | POA: Insufficient documentation

## 2016-10-15 DIAGNOSIS — M791 Myalgia, unspecified site: Secondary | ICD-10-CM

## 2016-10-15 DIAGNOSIS — M5441 Lumbago with sciatica, right side: Secondary | ICD-10-CM

## 2016-10-15 DIAGNOSIS — M5442 Lumbago with sciatica, left side: Secondary | ICD-10-CM | POA: Diagnosis not present

## 2016-10-15 LAB — COMPREHENSIVE METABOLIC PANEL
ALT: 20 U/L (ref 0–35)
AST: 16 U/L (ref 0–37)
Albumin: 4.2 g/dL (ref 3.5–5.2)
Alkaline Phosphatase: 59 U/L (ref 39–117)
BUN: 17 mg/dL (ref 6–23)
CO2: 28 mEq/L (ref 19–32)
Calcium: 9.3 mg/dL (ref 8.4–10.5)
Chloride: 105 mEq/L (ref 96–112)
Creatinine, Ser: 0.88 mg/dL (ref 0.40–1.20)
GFR: 66.02 mL/min (ref >60.00)
Glucose, Bld: 111 mg/dL — ABNORMAL HIGH (ref 70–99)
Potassium: 4 mEq/L (ref 3.5–5.1)
Sodium: 140 mEq/L (ref 135–145)
Total Bilirubin: 0.3 mg/dL (ref 0.2–1.2)
Total Protein: 6.7 g/dL (ref 6.0–8.3)

## 2016-10-15 LAB — CBC
HCT: 43.2 % (ref 36.0–46.0)
Hemoglobin: 14.6 g/dL (ref 12.0–15.0)
MCHC: 33.8 g/dL (ref 30.0–36.0)
MCV: 89.9 fl (ref 78.0–100.0)
Platelets: 278 10*3/uL (ref 150.0–400.0)
RBC: 4.81 Mil/uL (ref 3.87–5.11)
RDW: 13.7 % (ref 11.5–15.5)
WBC: 7.2 10*3/uL (ref 4.0–10.5)

## 2016-10-15 LAB — FERRITIN: Ferritin: 142.2 ng/mL (ref 10.0–291.0)

## 2016-10-15 LAB — CK: Total CK: 83 U/L (ref 7–177)

## 2016-10-15 NOTE — Progress Notes (Signed)
   Subjective:    Patient ID: Erica [name], female    DOB: July 31, 1940, 76 y.o.   MRN: [id number]  HPI The patient is a 76 YO female coming in for new myalgias and back pain. She is having low right sided back pain and radiates down into the her right thigh. Started about 1 month ago and no trigger. She has tried aspirin for it which helped some. She is worried about it as it is persistent since that time. Denies numbness or weakness in her legs. No bowel or bladder changes. She denies injury to the area prior to onset. No change since onset.  The muscle aches are more in her neck and shoulders and trunk area. In the mornings she is stiff for hours and then gradually improve during the day. If she does too much they hurt worse and feel weak. She has not tried anything for this. This is going on for the last 1-2 months and overall she feels like this is worsening. No fevers or chills. No weight change. Not in her wrists or feet. No rash on the skin.   Review of Systems  Constitutional: Positive for activity change, fatigue and unexpected weight change. Negative for appetite change, chills and fever.  HENT: Negative.   Eyes: Negative.   Respiratory: Negative.   Cardiovascular: Negative.   Gastrointestinal: Negative.   Musculoskeletal: Positive for arthralgias, back pain, myalgias, neck pain and neck stiffness. Negative for gait problem and joint swelling.  Skin: Negative.   Neurological: Negative.   Psychiatric/Behavioral: Negative.       Objective:   Physical Exam  Constitutional: She is oriented to person, place, and time. She appears well-developed and well-nourished.  HENT:  Head: Normocephalic and atraumatic.  Eyes: EOM are normal.  Neck: Normal range of motion. No JVD present.  Some slow ROM but full  Cardiovascular: Normal rate and regular rhythm.   Pulmonary/Chest: Effort normal and breath sounds normal.  Abdominal: Soft.  Musculoskeletal: She exhibits tenderness.  Pain in the  midline lumbar region with radiation to the bilateral thighs, right more than left  Lymphadenopathy:    She has no cervical adenopathy.  Neurological: She is alert and oriented to person, place, and time. Coordination abnormal.  Slow to rise and slow gait  Skin: Skin is warm and dry.    Vitals:   10/15/16 1336  BP: 130/60  Pulse: 85  Resp: 14  Temp: 98.3 F (36.8 C)  TempSrc: Oral  SpO2: 98%  Weight: 146 lb (66.2 kg)  Height: 5\' 4"  (1.626 m)      Assessment & Plan:

## 2016-10-15 NOTE — Patient Instructions (Signed)
We will check the labs today and the x-ray.   

## 2016-10-15 NOTE — Assessment & Plan Note (Signed)
Given the time course and onset it is concerning for polymyositis or autoimmune disease. Checking CK, ANA, RF and CMP, CBC to rule out alternative etiology. It is possible that this is muscular and she again elects not to take medication due to multiple intolerance with medication in the past.

## 2016-10-15 NOTE — Assessment & Plan Note (Signed)
Right worse than left. Will check x-ray today given the time course of symptoms. She elects not to try medication today for the pain because she is very sensitive to medications. Referral to sports medicine or neurosurgery if indicated.

## 2016-10-15 NOTE — Progress Notes (Signed)
Pre visit review using our clinic review tool, if applicable. No additional management support is needed unless otherwise documented below in the visit note. 

## 2016-10-16 LAB — RHEUMATOID FACTOR: Rhuematoid fact SerPl-aCnc: <14 IU/mL (ref <14)

## 2016-10-16 LAB — ANTI-NUCLEAR AB-TITER (ANA TITER): ANA Titer 1: 1:160 {titer} — ABNORMAL HIGH

## 2016-10-16 LAB — ANA: Anti Nuclear Antibody(ANA): POSITIVE — AB

## 2016-10-21 ENCOUNTER — Other Ambulatory Visit: Payer: Self-pay | Admitting: Internal Medicine

## 2016-10-21 DIAGNOSIS — M791 Myalgia, unspecified site: Secondary | ICD-10-CM

## 2016-10-21 DIAGNOSIS — R768 Other specified abnormal immunological findings in serum: Secondary | ICD-10-CM

## 2016-10-28 ENCOUNTER — Encounter: Payer: Self-pay | Admitting: Gastroenterology

## 2016-11-12 ENCOUNTER — Encounter: Payer: Medicare Other | Admitting: Internal Medicine

## 2016-11-27 ENCOUNTER — Other Ambulatory Visit (INDEPENDENT_AMBULATORY_CARE_PROVIDER_SITE_OTHER): Payer: Medicare Other

## 2016-11-27 ENCOUNTER — Encounter: Payer: Self-pay | Admitting: Internal Medicine

## 2016-11-27 ENCOUNTER — Ambulatory Visit (INDEPENDENT_AMBULATORY_CARE_PROVIDER_SITE_OTHER): Payer: Medicare Other | Admitting: Internal Medicine

## 2016-11-27 VITALS — BP 122/64 | HR 76 | Temp 98.1°F | Resp 12 | Ht 64.0 in | Wt 146.0 lb

## 2016-11-27 DIAGNOSIS — E785 Hyperlipidemia, unspecified: Secondary | ICD-10-CM | POA: Diagnosis not present

## 2016-11-27 DIAGNOSIS — E119 Type 2 diabetes mellitus without complications: Secondary | ICD-10-CM

## 2016-11-27 DIAGNOSIS — R7989 Other specified abnormal findings of blood chemistry: Secondary | ICD-10-CM

## 2016-11-27 DIAGNOSIS — Z0000 Encounter for general adult medical examination without abnormal findings: Secondary | ICD-10-CM

## 2016-11-27 DIAGNOSIS — L272 Dermatitis due to ingested food: Secondary | ICD-10-CM

## 2016-11-27 LAB — COMPREHENSIVE METABOLIC PANEL
ALT: 15 U/L (ref 0–35)
AST: 13 U/L (ref 0–37)
Albumin: 4.1 g/dL (ref 3.5–5.2)
Alkaline Phosphatase: 65 U/L (ref 39–117)
BUN: 18 mg/dL (ref 6–23)
CO2: 26 mEq/L (ref 19–32)
Calcium: 9.5 mg/dL (ref 8.4–10.5)
Chloride: 105 mEq/L (ref 96–112)
Creatinine, Ser: 0.8 mg/dL (ref 0.40–1.20)
GFR: 73.67 mL/min (ref >60.00)
Glucose, Bld: 94 mg/dL (ref 70–99)
Potassium: 4.2 mEq/L (ref 3.5–5.1)
Sodium: 137 mEq/L (ref 135–145)
Total Bilirubin: 0.3 mg/dL (ref 0.2–1.2)
Total Protein: 6.7 g/dL (ref 6.0–8.3)

## 2016-11-27 LAB — LDL CHOLESTEROL, DIRECT: Direct LDL: 113 mg/dL

## 2016-11-27 LAB — LIPID PANEL
Cholesterol: 178 mg/dL (ref 0–200)
HDL: 36.3 mg/dL — ABNORMAL LOW (ref >39.00)
NonHDL: 141.53
Total CHOL/HDL Ratio: 5
Triglycerides: 253 mg/dL — ABNORMAL HIGH (ref 0.0–149.0)
VLDL: 50.6 mg/dL — ABNORMAL HIGH (ref 0.0–40.0)

## 2016-11-27 MED ORDER — ZOSTER VAC RECOMB ADJUVANTED 50 MCG/0.5ML IM SUSR: 0.5000 mL | Freq: Once | INTRAMUSCULAR | 1 refills | Status: AC

## 2016-11-27 NOTE — Progress Notes (Signed)
   Subjective:    Patient ID: Erica [name], female    DOB: 10/04/1940, 76 y.o.   MRN: [id number]  HPI Here for medicare wellness and physical, no new complaints. Please see A/P for status and treatment of chronic medical problems.   Diet: heart healthy Physical activity: active Depression/mood screen: negative Hearing: intact to whispered voice left hearing aid Visual acuity: grossly normal, reading lens, performs annual eye exam  ADLs: capable Fall risk: none Home safety: good Cognitive evaluation: intact to orientation, naming, recall and repetition EOL planning: adv directives discussed  I have personally reviewed and have noted 1. The patient's medical and social history - reviewed today no changes 2. Their use of alcohol, tobacco or illicit drugs 3. Their current medications and supplements 4. The patient's functional ability including ADL's, fall risks, home safety risks and hearing or visual impairment. 5. Diet and physical activities 6. Evidence for depression or mood disorders 7. Care team reviewed and updated (available in snapshot)  Review of Systems  Constitutional: Negative.   HENT: Positive for hearing loss.   Eyes: Negative.   Respiratory: Negative for cough, chest tightness and shortness of breath.   Cardiovascular: Negative for chest pain, palpitations and leg swelling.  Gastrointestinal: Negative for abdominal distention, abdominal pain, constipation, diarrhea, nausea and vomiting.  Musculoskeletal: Negative.   Skin: Negative.   Neurological: Negative.   Psychiatric/Behavioral: Negative.       Objective:   Physical Exam  Constitutional: She is oriented to person, place, and time. She appears well-developed and well-nourished.  HENT:  Head: Normocephalic and atraumatic.  Eyes: EOM are normal.  Neck: Normal range of motion.  Cardiovascular: Normal rate and regular rhythm.   Pulmonary/Chest: Effort normal and breath sounds normal. No respiratory  distress. She has no wheezes. She has no rales.  Abdominal: Soft. Bowel sounds are normal. She exhibits no distension. There is no tenderness. There is no rebound.  Musculoskeletal: She exhibits no edema.  Neurological: She is alert and oriented to person, place, and time. Coordination normal.  Skin: Skin is warm and dry.  Psychiatric: She has a normal mood and affect.   Vitals:   11/27/16 1431  BP: 122/64  Pulse: 76  Resp: 12  Temp: 98.1 F (36.7 C)  TempSrc: Oral  SpO2: 98%  Weight: 146 lb (66.2 kg)  Height: 5\' 4"  (1.626 m)      Assessment & Plan:  Cologuard ordered

## 2016-11-27 NOTE — Patient Instructions (Signed)
We will get the cologuard sent to your house.   We have given you the prescription for shingrix.   We are checking the labs today.   Health Maintenance, Female Adopting a healthy lifestyle and getting preventive care can go a long way to promote health and wellness. Talk with your health care provider about what schedule of regular examinations is right for you. This is a good chance for you to check in with your provider about disease prevention and staying healthy. In between checkups, there are plenty of things you can do on your own. Experts have done a lot of research about which lifestyle changes and preventive measures are most likely to keep you healthy. Ask your health care provider for more information. Weight and diet Eat a healthy diet  Be sure to include plenty of vegetables, fruits, low-fat dairy products, and lean protein.  Do not eat a lot of foods high in solid fats, added sugars, or salt.  Get regular exercise. This is one of the most important things you can do for your health. ? Most adults should exercise for at least 150 minutes each week. The exercise should increase your heart rate and make you sweat (moderate-intensity exercise). ? Most adults should also do strengthening exercises at least twice a week. This is in addition to the moderate-intensity exercise.  Maintain a healthy weight  Body mass index (BMI) is a measurement that can be used to identify possible weight problems. It estimates body fat based on height and weight. Your health care provider can help determine your BMI and help you achieve or maintain a healthy weight.  For females 81 years of age and older: ? A BMI below 18.5 is considered underweight. ? A BMI of 18.5 to 24.9 is normal. ? A BMI of 25 to 29.9 is considered overweight. ? A BMI of 30 and above is considered obese.  Watch levels of cholesterol and blood lipids  You should start having your blood tested for lipids and cholesterol at 78  years of age, then have this test every 5 years.  You may need to have your cholesterol levels checked more often if: ? Your lipid or cholesterol levels are high. ? You are older than 78 years of age. ? You are at high risk for heart disease.  Cancer screening Lung Cancer  Lung cancer screening is recommended for adults 42-21 years old who are at high risk for lung cancer because of a history of smoking.  A yearly low-dose CT scan of the lungs is recommended for people who: ? Currently smoke. ? Have quit within the past 15 years. ? Have at least a 30-pack-year history of smoking. A pack year is smoking an average of one pack of cigarettes a day for 1 year.  Yearly screening should continue until it has been 15 years since you quit.  Yearly screening should stop if you develop a health problem that would prevent you from having lung cancer treatment.  Breast Cancer  Practice breast self-awareness. This means understanding how your breasts normally appear and feel.  It also means doing regular breast self-exams. Let your health care provider know about any changes, no matter how small.  If you are in your 20s or 30s, you should have a clinical breast exam (CBE) by a health care provider every 1-3 years as part of a regular health exam.  If you are 12 or older, have a CBE every year. Also consider having a breast X-ray (  mammogram) every year.  If you have a family history of breast cancer, talk to your health care provider about genetic screening.  If you are at high risk for breast cancer, talk to your health care provider about having an MRI and a mammogram every year.  Breast cancer gene (BRCA) assessment is recommended for women who have family members with BRCA-related cancers. BRCA-related cancers include: ? Breast. ? Ovarian. ? Tubal. ? Peritoneal cancers.  Results of the assessment will determine the need for genetic counseling and BRCA1 and BRCA2 testing.  Cervical  Cancer Your health care provider may recommend that you be screened regularly for cancer of the pelvic organs (ovaries, uterus, and vagina). This screening involves a pelvic examination, including checking for microscopic changes to the surface of your cervix (Pap test). You may be encouraged to have this screening done every 3 years, beginning at age 86.  For women ages 45-65, health care providers may recommend pelvic exams and Pap testing every 3 years, or they may recommend the Pap and pelvic exam, combined with testing for human papilloma virus (HPV), every 5 years. Some types of HPV increase your risk of cervical cancer. Testing for HPV may also be done on women of any age with unclear Pap test results.  Other health care providers may not recommend any screening for nonpregnant women who are considered low risk for pelvic cancer and who do not have symptoms. Ask your health care provider if a screening pelvic exam is right for you.  If you have had past treatment for cervical cancer or a condition that could lead to cancer, you need Pap tests and screening for cancer for at least 20 years after your treatment. If Pap tests have been discontinued, your risk factors (such as having a new sexual partner) need to be reassessed to determine if screening should resume. Some women have medical problems that increase the chance of getting cervical cancer. In these cases, your health care provider may recommend more frequent screening and Pap tests.  Colorectal Cancer  This type of cancer can be detected and often prevented.  Routine colorectal cancer screening usually begins at 78 years of age and continues through 78 years of age.  Your health care provider may recommend screening at an earlier age if you have risk factors for colon cancer.  Your health care provider may also recommend using home test kits to check for hidden blood in the stool.  A small camera at the end of a tube can be used to  examine your colon directly (sigmoidoscopy or colonoscopy). This is done to check for the earliest forms of colorectal cancer.  Routine screening usually begins at age 29.  Direct examination of the colon should be repeated every 5-10 years through 78 years of age. However, you may need to be screened more often if early forms of precancerous polyps or small growths are found.  Skin Cancer  Check your skin from head to toe regularly.  Tell your health care provider about any new moles or changes in moles, especially if there is a change in a mole's shape or color.  Also tell your health care provider if you have a mole that is larger than the size of a pencil eraser.  Always use sunscreen. Apply sunscreen liberally and repeatedly throughout the day.  Protect yourself by wearing long sleeves, pants, a wide-brimmed hat, and sunglasses whenever you are outside.  Heart disease, diabetes, and high blood pressure  High blood pressure  causes heart disease and increases the risk of stroke. High blood pressure is more likely to develop in: ? People who have blood pressure in the high end of the normal range (130-139/85-89 mm Hg). ? People who are overweight or obese. ? People who are African American.  If you are 79-44 years of age, have your blood pressure checked every 3-5 years. If you are 46 years of age or older, have your blood pressure checked every year. You should have your blood pressure measured twice-once when you are at a hospital or clinic, and once when you are not at a hospital or clinic. Record the average of the two measurements. To check your blood pressure when you are not at a hospital or clinic, you can use: ? An automated blood pressure machine at a pharmacy. ? A home blood pressure monitor.  If you are between 94 years and 72 years old, ask your health care provider if you should take aspirin to prevent strokes.  Have regular diabetes screenings. This involves taking a  blood sample to check your fasting blood sugar level. ? If you are at a normal weight and have a low risk for diabetes, have this test once every three years after 78 years of age. ? If you are overweight and have a high risk for diabetes, consider being tested at a younger age or more often. Preventing infection Hepatitis B  If you have a higher risk for hepatitis B, you should be screened for this virus. You are considered at high risk for hepatitis B if: ? You were born in a country where hepatitis B is common. Ask your health care provider which countries are considered high risk. ? Your parents were born in a high-risk country, and you have not been immunized against hepatitis B (hepatitis B vaccine). ? You have HIV or AIDS. ? You use needles to inject street drugs. ? You live with someone who has hepatitis B. ? You have had sex with someone who has hepatitis B. ? You get hemodialysis treatment. ? You take certain medicines for conditions, including cancer, organ transplantation, and autoimmune conditions.  Hepatitis C  Blood testing is recommended for: ? Everyone born from [birth year] through 1967. ? Anyone with known risk factors for hepatitis C.  Sexually transmitted infections (STIs)  You should be screened for sexually transmitted infections (STIs) including gonorrhea and chlamydia if: ? You are sexually active and are younger than 78 years of age. ? You are older than 78 years of age and your health care provider tells you that you are at risk for this type of infection. ? Your sexual activity has changed since you were last screened and you are at an increased risk for chlamydia or gonorrhea. Ask your health care provider if you are at risk.  If you do not have HIV, but are at risk, it may be recommended that you take a prescription medicine daily to prevent HIV infection. This is called pre-exposure prophylaxis (PrEP). You are considered at risk if: ? You are sexually active and  do not regularly use condoms or know the HIV status of your partner(s). ? You take drugs by injection. ? You are sexually active with a partner who has HIV.  Talk with your health care provider about whether you are at high risk of being infected with HIV. If you choose to begin PrEP, you should first be tested for HIV. You should then be tested every 3 months for as long  as you are taking PrEP. Pregnancy  If you are premenopausal and you may become pregnant, ask your health care provider about preconception counseling.  If you may become pregnant, take 400 to 800 micrograms (mcg) of folic acid every day.  If you want to prevent pregnancy, talk to your health care provider about birth control (contraception). Osteoporosis and menopause  Osteoporosis is a disease in which the bones lose minerals and strength with aging. This can result in serious bone fractures. Your risk for osteoporosis can be identified using a bone density scan.  If you are 78 years of age or older, or if you are at risk for osteoporosis and fractures, ask your health care provider if you should be screened.  Ask your health care provider whether you should take a calcium or vitamin D supplement to lower your risk for osteoporosis.  Menopause may have certain physical symptoms and risks.  Hormone replacement therapy may reduce some of these symptoms and risks. Talk to your health care provider about whether hormone replacement therapy is right for you. Follow these instructions at home:  Schedule regular health, dental, and eye exams.  Stay current with your immunizations.  Do not use any tobacco products including cigarettes, chewing tobacco, or electronic cigarettes.  If you are pregnant, do not drink alcohol.  If you are breastfeeding, limit how much and how often you drink alcohol.  Limit alcohol intake to no more than 1 drink per day for nonpregnant women. One drink equals 12 ounces of beer, 5 ounces of  wine, or 1 ounces of hard liquor.  Do not use street drugs.  Do not share needles.  Ask your health care provider for help if you need support or information about quitting drugs.  Tell your health care provider if you often feel depressed.  Tell your health care provider if you have ever been abused or do not feel safe at home. This information is not intended to replace advice given to you by your health care provider. Make sure you discuss any questions you have with your health care provider. Document Released: 12/24/2010 Document Revised: 11/16/2015 Document Reviewed: 03/14/2015 Elsevier Interactive Patient Education  [employer].

## 2016-11-28 ENCOUNTER — Encounter: Payer: Medicare Other | Admitting: Internal Medicine

## 2016-11-29 NOTE — Assessment & Plan Note (Signed)
Checking lipid panel. She declines all statin treatment regardless of results.

## 2016-11-29 NOTE — Assessment & Plan Note (Addendum)
Rx for shingrix given and ordered cologuard for screening. Pneumonia up to date, reminded about yearly flu shot. Counseled about sun safety and mole surveillance. Given 10 year screening recommendations.

## 2016-11-29 NOTE — Assessment & Plan Note (Signed)
Foot exam done, checking labs. Controlled by diet and adjust if needed. Not complicated.

## 2016-11-30 LAB — COPPER, SERUM: Copper: 86 ug/dL (ref 70–175)

## 2016-12-01 ENCOUNTER — Other Ambulatory Visit: Payer: Self-pay | Admitting: Internal Medicine

## 2016-12-03 ENCOUNTER — Encounter: Payer: Self-pay | Admitting: Family

## 2016-12-09 LAB — HM DIABETES EYE EXAM

## 2016-12-31 LAB — HM DIABETES EYE EXAM

## 2017-01-29 ENCOUNTER — Encounter: Payer: Self-pay | Admitting: Internal Medicine

## 2017-01-30 ENCOUNTER — Ambulatory Visit: Payer: Self-pay | Admitting: Allergy

## 2017-01-31 ENCOUNTER — Telehealth: Payer: Self-pay

## 2017-01-31 NOTE — Telephone Encounter (Signed)
Order [id number]

## 2017-03-14 ENCOUNTER — Encounter: Payer: Self-pay | Admitting: Internal Medicine

## 2017-03-19 ENCOUNTER — Encounter: Payer: Self-pay | Admitting: Internal Medicine

## 2017-03-19 ENCOUNTER — Ambulatory Visit (INDEPENDENT_AMBULATORY_CARE_PROVIDER_SITE_OTHER): Payer: Medicare Other | Admitting: Internal Medicine

## 2017-03-19 VITALS — BP 100/60 | HR 60 | Temp 98.3°F | Ht 64.0 in | Wt 143.0 lb

## 2017-03-19 DIAGNOSIS — Z23 Encounter for immunization: Secondary | ICD-10-CM | POA: Diagnosis not present

## 2017-03-19 DIAGNOSIS — R1011 Right upper quadrant pain: Secondary | ICD-10-CM

## 2017-03-19 NOTE — Patient Instructions (Signed)
IB guard is something to think about trying or a probiotic.   We will get the ultrasound to look at the gall bladder.

## 2017-03-21 DIAGNOSIS — R1011 Right upper quadrant pain: Secondary | ICD-10-CM | POA: Insufficient documentation

## 2017-03-21 NOTE — Progress Notes (Signed)
   Subjective:    Patient ID: Erica [name], female    DOB: 22-Apr-1941, 76 y.o.   MRN: [id number]  HPI The patient is a 76 YO female coming in for ruq pain. Started within the last 6 weeks. She has been having some gas and pain with eating. Mostly in the RUQ. Has tried otc tums etc without relief. She has tried gas-x without much relief. She has been having some mild diarrhea. She feels like she has had chills with it but no fevers. Denies blood in stool or constipation. No significant change in diet. No recent travel.   Review of Systems  Constitutional: Negative.   Respiratory: Negative for cough, chest tightness and shortness of breath.   Cardiovascular: Negative for chest pain, palpitations and leg swelling.  Gastrointestinal: Positive for abdominal distention and abdominal pain. Negative for anal bleeding, blood in stool, constipation, diarrhea, nausea and vomiting.  Musculoskeletal: Negative.   Skin: Negative.   Neurological: Negative.       Objective:   Physical Exam  Constitutional: She is oriented to person, place, and time. She appears well-developed and well-nourished.  HENT:  Head: Normocephalic and atraumatic.  Eyes: EOM are normal.  Neck: Normal range of motion.  Cardiovascular: Normal rate and regular rhythm.   Pulmonary/Chest: Effort normal and breath sounds normal. No respiratory distress. She has no wheezes. She has no rales.  Abdominal: Soft. Bowel sounds are normal. She exhibits no distension and no mass. There is tenderness. There is no rebound and no guarding.  Mild tenderness RUQ without guarding or rebound.   Musculoskeletal: She exhibits no edema.  Neurological: She is alert and oriented to person, place, and time. Coordination normal.  Skin: Skin is warm and dry.   Vitals:   03/19/17 1029  BP: 100/60  Pulse: 60  Temp: 98.3 F (36.8 C)  TempSrc: Oral  SpO2: 98%  Weight: 143 lb (64.9 kg)  Height: 5\' 4"  (1.626 m)      Assessment & Plan:  Flu shot  given at visit

## 2017-03-21 NOTE — Assessment & Plan Note (Signed)
Checking RUQ US abdomen to rule out gallstones.

## 2017-03-27 ENCOUNTER — Ambulatory Visit
Admission: RE | Admit: 2017-03-27 | Discharge: 2017-03-27 | Disposition: A | Discharge status: Home / Self Care | Payer: Medicare Other | Source: Ambulatory Visit | Attending: Internal Medicine | Admitting: Internal Medicine

## 2017-03-27 DIAGNOSIS — R1011 Right upper quadrant pain: Secondary | ICD-10-CM

## 2017-03-31 ENCOUNTER — Encounter: Payer: Self-pay | Admitting: Internal Medicine

## 2017-03-31 DIAGNOSIS — E119 Type 2 diabetes mellitus without complications: Secondary | ICD-10-CM

## 2017-05-26 ENCOUNTER — Encounter: Payer: Self-pay | Admitting: Internal Medicine

## 2017-05-26 ENCOUNTER — Other Ambulatory Visit (INDEPENDENT_AMBULATORY_CARE_PROVIDER_SITE_OTHER): Payer: Medicare Other

## 2017-05-26 DIAGNOSIS — R300 Dysuria: Secondary | ICD-10-CM | POA: Diagnosis not present

## 2017-05-26 LAB — URINALYSIS, ROUTINE W REFLEX MICROSCOPIC
Bilirubin Urine: NEGATIVE
Ketones, ur: NEGATIVE
Leukocytes, UA: NEGATIVE
Nitrite: NEGATIVE
Specific Gravity, Urine: 1.025 (ref 1.000–1.030)
Total Protein, Urine: NEGATIVE
Urine Glucose: NEGATIVE
Urobilinogen, UA: 0.2 (ref 0.0–1.0)
pH: 6 (ref 5.0–8.0)

## 2017-06-06 NOTE — Telephone Encounter (Signed)
04/01/2017: Cancelled - Suspended for Inactivity

## 2017-07-24 ENCOUNTER — Encounter: Payer: Self-pay | Admitting: Internal Medicine

## 2017-09-19 ENCOUNTER — Encounter: Payer: Self-pay | Admitting: Internal Medicine

## 2017-09-19 ENCOUNTER — Other Ambulatory Visit (INDEPENDENT_AMBULATORY_CARE_PROVIDER_SITE_OTHER): Payer: Medicare Other

## 2017-09-19 ENCOUNTER — Ambulatory Visit: Payer: Medicare Other | Admitting: Internal Medicine

## 2017-09-19 VITALS — BP 122/62 | HR 69 | Temp 97.9°F | Ht 64.0 in | Wt 147.0 lb

## 2017-09-19 DIAGNOSIS — R0789 Other chest pain: Secondary | ICD-10-CM

## 2017-09-19 DIAGNOSIS — R079 Chest pain, unspecified: Secondary | ICD-10-CM | POA: Insufficient documentation

## 2017-09-19 DIAGNOSIS — E119 Type 2 diabetes mellitus without complications: Secondary | ICD-10-CM | POA: Diagnosis not present

## 2017-09-19 LAB — CBC
HCT: 42.7 % (ref 36.0–46.0)
Hemoglobin: 14.2 g/dL (ref 12.0–15.0)
MCHC: 33.1 g/dL (ref 30.0–36.0)
MCV: 91.2 fl (ref 78.0–100.0)
Platelets: 278 10*3/uL (ref 150.0–400.0)
RBC: 4.68 Mil/uL (ref 3.87–5.11)
RDW: 13.4 % (ref 11.5–15.5)
WBC: 6.4 10*3/uL (ref 4.0–10.5)

## 2017-09-19 LAB — MICROALBUMIN / CREATININE URINE RATIO
Creatinine,U: 111.9 mg/dL
Microalb Creat Ratio: 0.6 mg/g (ref 0.0–30.0)
Microalb, Ur: <0.7 mg/dL (ref 0.0–1.9)

## 2017-09-19 LAB — COMPREHENSIVE METABOLIC PANEL
ALT: 16 U/L (ref 0–35)
AST: 13 U/L (ref 0–37)
Albumin: 3.9 g/dL (ref 3.5–5.2)
Alkaline Phosphatase: 60 U/L (ref 39–117)
BUN: 18 mg/dL (ref 6–23)
CO2: 27 mEq/L (ref 19–32)
Calcium: 9 mg/dL (ref 8.4–10.5)
Chloride: 104 mEq/L (ref 96–112)
Creatinine, Ser: 0.81 mg/dL (ref 0.40–1.20)
GFR: 72.47 mL/min (ref >60.00)
Glucose, Bld: 98 mg/dL (ref 70–99)
Potassium: 4.4 mEq/L (ref 3.5–5.1)
Sodium: 139 mEq/L (ref 135–145)
Total Bilirubin: 0.3 mg/dL (ref 0.2–1.2)
Total Protein: 6.8 g/dL (ref 6.0–8.3)

## 2017-09-19 LAB — HEMOGLOBIN A1C: Hgb A1c MFr Bld: 6.3 % (ref 4.6–6.5)

## 2017-09-19 LAB — TROPONIN I: TNIDX: 0.01 ug/l (ref 0.00–0.06)

## 2017-09-19 NOTE — Patient Instructions (Signed)
We will get the stress test done and labs today. EKG is normal.

## 2017-09-19 NOTE — Progress Notes (Signed)
   Subjective:    Patient ID: Erica [name], female    DOB: Oct 30, 1940, 77 y.o.   MRN: [id number]  HPI The patient is a 77 YO female coming in for chest pains going on for the last week or so. She woke up with chest pain about 1 week ago which lasted about 20 minutes. She took 2 aspirin which seemed to help. The pain was on the left side and radiated into her jaw, denies arm pain or numbness. She then had another episode while playing with her dog outside. She stopped to rest and it went away in about 10-15 minutes. Denies jaw pain or arm pain with that one. She is also having some other new symptoms in the last 1-2 months of more SOB and more stomach discomfort. She is getting winded doing things she could previously do well. Denies SOB with sleeping. Does have diet controlled DM. She does heart disease in several family members. Prior stress test in 2017 which was negative.   CV risk 30+% 10 year, chest  Score 3  Review of Systems  Constitutional: Positive for activity change. Negative for appetite change, chills, fever and unexpected weight change.  HENT: Negative.   Eyes: Negative.   Respiratory: Positive for shortness of breath. Negative for cough and chest tightness.   Cardiovascular: Positive for chest pain. Negative for palpitations and leg swelling.  Gastrointestinal: Positive for abdominal distention and abdominal pain. Negative for constipation, diarrhea, nausea and vomiting.  Musculoskeletal: Negative.   Skin: Negative.   Neurological: Negative.   Psychiatric/Behavioral: Negative.       Objective:   Physical Exam  Constitutional: She is oriented to person, place, and time. She appears well-developed and well-nourished.  HENT:  Head: Normocephalic and atraumatic.  Eyes: EOM are normal.  Neck: Normal range of motion.  Cardiovascular: Normal rate and regular rhythm.  Pulmonary/Chest: Effort normal and breath sounds normal. No respiratory distress. She has no wheezes. She has  no rales.  Abdominal: Soft. Bowel sounds are normal. She exhibits no distension. There is no tenderness. There is no rebound.  Musculoskeletal: She exhibits no edema.  Neurological: She is alert and oriented to person, place, and time. Coordination normal.  Skin: Skin is warm and dry.  Psychiatric: She has a normal mood and affect.   Vitals:   09/19/17 1302  BP: 122/62  Pulse: 69  Temp: 97.9 F (36.6 C)  TempSrc: Oral  SpO2: 96%  Weight: 147 lb (66.7 kg)  Height: 5\' 4"  (1.626 m)   EKG: Rate 68, axis normal, intervals normal, no st or t wave changes, no change from prior 2017    Assessment & Plan:

## 2017-09-19 NOTE — Assessment & Plan Note (Signed)
The history is convincing as well as the new SOB and abdominal concerns to worry about CV disease. EKG done today without changes, will refer for stress testing. Checking troponin today to rule out acute event. Continue aspirin 81 mg daily for now.

## 2017-09-25 LAB — COLOGUARD: Cologuard: NEGATIVE

## 2017-10-02 ENCOUNTER — Telehealth (HOSPITAL_COMMUNITY): Payer: Self-pay

## 2017-10-02 ENCOUNTER — Encounter: Payer: Self-pay | Admitting: Internal Medicine

## 2017-10-02 NOTE — Progress Notes (Signed)
Abstracted and sent to scan  

## 2017-10-02 NOTE — Telephone Encounter (Signed)
Encounter complete. 

## 2017-10-07 ENCOUNTER — Ambulatory Visit (HOSPITAL_COMMUNITY)
Admission: RE | Admit: 2017-10-07 | Discharge: 2017-10-07 | Disposition: A | Discharge status: Home / Self Care | Payer: Medicare Other | Source: Ambulatory Visit | Attending: Cardiology | Admitting: Cardiology

## 2017-10-07 DIAGNOSIS — J45909 Unspecified asthma, uncomplicated: Secondary | ICD-10-CM | POA: Insufficient documentation

## 2017-10-07 DIAGNOSIS — E781 Pure hyperglyceridemia: Secondary | ICD-10-CM | POA: Diagnosis not present

## 2017-10-07 DIAGNOSIS — E119 Type 2 diabetes mellitus without complications: Secondary | ICD-10-CM | POA: Diagnosis not present

## 2017-10-07 DIAGNOSIS — Z8249 Family history of ischemic heart disease and other diseases of the circulatory system: Secondary | ICD-10-CM | POA: Diagnosis not present

## 2017-10-07 DIAGNOSIS — R0789 Other chest pain: Secondary | ICD-10-CM

## 2017-10-07 LAB — MYOCARDIAL PERFUSION IMAGING
LV dias vol: 55 mL (ref 46–106)
LV sys vol: 19 mL
Peak HR: 102 {beats}/min
Rest HR: 60 {beats}/min
SDS: 1
SRS: 7
SSS: 8
TID: 1.33

## 2017-10-07 MED ORDER — AMINOPHYLLINE 25 MG/ML IV SOLN
75.0000 mg | Freq: Once | INTRAVENOUS | Status: AC
  Administered 2017-10-07: 75 mg via INTRAVENOUS

## 2017-10-07 MED ORDER — TECHNETIUM TC 99M TETROFOSMIN IV KIT
10.1000 | PACK | Freq: Once | INTRAVENOUS | Status: AC | PRN
  Administered 2017-10-07: 10.1 via INTRAVENOUS
  Filled 2017-10-07: qty 11

## 2017-10-07 MED ORDER — REGADENOSON 0.4 MG/5ML IV SOLN
0.4000 mg | Freq: Once | INTRAVENOUS | Status: AC
  Administered 2017-10-07: 0.4 mg via INTRAVENOUS

## 2017-10-07 MED ORDER — TECHNETIUM TC 99M TETROFOSMIN IV KIT
30.1000 | PACK | Freq: Once | INTRAVENOUS | Status: AC | PRN
  Administered 2017-10-07: 30.1 via INTRAVENOUS
  Filled 2017-10-07: qty 31

## 2017-10-29 ENCOUNTER — Encounter: Payer: Self-pay | Admitting: Internal Medicine

## 2017-10-29 NOTE — Telephone Encounter (Signed)
Ok to schedule.

## 2017-10-30 ENCOUNTER — Ambulatory Visit: Payer: Medicare Other | Admitting: Family Medicine

## 2017-10-30 ENCOUNTER — Encounter: Payer: Self-pay | Admitting: Family Medicine

## 2017-10-30 DIAGNOSIS — M5441 Lumbago with sciatica, right side: Secondary | ICD-10-CM | POA: Diagnosis not present

## 2017-10-30 NOTE — Progress Notes (Signed)
Erica [name] - 77 y.o. female MRN [id number]  Date of birth: 12-05-1940  SUBJECTIVE:  Including CC & ROS.  Chief Complaint  Patient presents with  . Back Pain    Erica [name] is a 77 y.o. female that is presenting with back pain. Ongoing for four days. She was cleaning her house all day, she did a lot of lifting. Back pain is located near her lower back and shooting pain down her right leg. Sitting worsens the pain. Described as a dull ache.She has been taking motrin for the pain.    Independent review of the lumbar spine xray from 10/15/16 shows an anterolisthesis at L4-L5.    Review of Systems  Constitutional: Negative for fever.  HENT: Negative for congestion.   Respiratory: Negative for cough.   Cardiovascular: Negative for chest pain.  Gastrointestinal: Negative for abdominal pain.  Musculoskeletal: Positive for back pain.  Skin: Negative for color change.  Neurological: Negative for weakness.  Hematological: Negative for adenopathy.  Psychiatric/Behavioral: Negative for agitation.    HISTORY: Past Medical, Surgical, Social, and Family History Reviewed & Updated per EMR.   Pertinent Historical Findings include:  Past Medical History:  Diagnosis Date  . Allergic rhinitis   . Arthritis   . Asthma   . Diet-controlled type 2 diabetes mellitus ([place]) 09/2011 dx  . Diverticulosis   . Dyslipidemia   . Macular degeneration    genetic etiology, not age-related per retinal specailist  . Recurrent kidney stones     Past Surgical History:  Procedure Laterality Date  . CATARACT EXTRACTION Bilateral 01/2015   L on 8/8, R on 8/22  . DILATION AND CURETTAGE OF UTERUS    . TONSILLECTOMY AND ADENOIDECTOMY      Allergies  Allergen Reactions  . Scallops [Shellfish Allergy] Nausea And Vomiting  . Statins Other (See Comments)    Phobia of statins    Family History  Problem Relation Age of Onset  . Heart disease Father   . Hyperlipidemia Father   . Hypertension Father   .  Colon cancer Mother   . Arthritis Mother   . Cancer Mother        colon cancer  . Diabetes Brother   . Hyperlipidemia Son   . Diabetes Paternal Aunt      Social History   Socioeconomic History  . Marital status: Married    Spouse name: Not on file  . Number of children: 2  . Years of education: Not on file  . Highest education level: Not on file  Occupational History  . Occupation: Retired  Scientific laboratory technician  . Financial resource strain: Not on file  . Food insecurity:    Worry: Not on file    Inability: Not on file  . Transportation needs:    Medical: Not on file    Non-medical: Not on file  Tobacco Use  . Smoking status: Never Smoker  . Smokeless tobacco: Never Used  . 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[place] but in [place] since 1988  Substance and Sexual Activity  . Alcohol use: Yes    Alcohol/week: 1.8 oz    Types: 3 Glasses of wine per week    Comment: can't drink red wine anymore; on glass of white wine rarely  . Drug use: No  . Sexual activity: Not on file  Lifestyle  . Physical activity:    Days per week: Not on file    Minutes per session:  Not on file  . Stress: Not on file  Relationships  . Social connections:    Talks on phone: Not on file    Gets together: Not on file    Attends religious service: Not on file    Active member of club or organization: Not on file    Attends meetings of clubs or organizations: Not on file    Relationship status: Not on file  . Intimate partner violence:    Fear of current or ex partner: Not on file    Emotionally abused: Not on file    Physically abused: Not on file    Forced sexual activity: Not on file  Other Topics Concern  . Not on file  Social History Narrative   Regular exercise-yes   Caffeine Use-yes               Epworth Sleepiness Scale = 5 (as of 07/04/2015)     PHYSICAL EXAM:  VS: BP 114/68 (BP Location: Left Arm, Patient Position: Sitting,  Cuff Size: Normal)   Pulse 70   Ht 5\' 4"  (1.626 m)   Wt 147 lb (66.7 kg)   SpO2 98%   BMI 25.23 kg/m  Physical Exam Gen: NAD, alert, cooperative with exam, well-appearing ENT: normal lips, normal nasal mucosa,  Eye: normal EOM, normal conjunctiva and lids CV:  no edema, +2 pedal pulses   Resp: no accessory muscle use, non-labored,  Skin: no rashes, no areas of induration  Neuro: normal tone, normal sensation to touch Psych:  normal insight, alert and oriented MSK:  Back:  TTP of the right SI joint  No TTP of the midline lumbar spine  Normal flexion and extension  Normal IR and ER  Normal strength to resistance with knee flexion and extension  Normal plantar and dorsiflexion  Negative SLR b/l  Normal gait Neurovascularly intact        ASSESSMENT & PLAN:   Low back pain Acute on chronic in nature. Tender over the right SI joint. Likely has a component of axial pain and sciatica. Improved today  - can continue tylenol and ibuprofen  - counseled on HEP  - if no improvement then consider trigger point injection vs PT vs imaging

## 2017-10-30 NOTE — Assessment & Plan Note (Signed)
Acute on chronic in nature. Tender over the right SI joint. Likely has a component of axial pain and sciatica. Improved today  - can continue tylenol and ibuprofen  - counseled on HEP  - if no improvement then consider trigger point injection vs PT vs imaging

## 2017-10-30 NOTE — Patient Instructions (Signed)
Please try tylenol or ibuprofen  Please try the exercises  If you don't have improvement of your symptoms then follow up

## 2017-12-14 ENCOUNTER — Encounter: Payer: Self-pay | Admitting: Internal Medicine

## 2017-12-15 MED ORDER — DOXYCYCLINE HYCLATE 100 MG PO TABS: 100.0000 mg | ORAL_TABLET | Freq: Two times a day (BID) | ORAL | 0 refills | Status: DC

## 2018-01-07 ENCOUNTER — Ambulatory Visit: Payer: Medicare Other

## 2018-01-07 NOTE — Progress Notes (Signed)
Subjective:   Erica [name] is a 77 y.o. female who presents for Medicare Annual (Subsequent) preventive examination.  Patient states she has been experiencing vertigo and headaches.  Review of Systems:  No ROS.  Medicare Wellness Visit. Additional risk factors are reflected in the social history.  Cardiac Risk Factors include: advanced age (>52men, >11 women);diabetes mellitus;dyslipidemia;hypertension Sleep patterns: has frequent nighttime awakenings, gets up 1 times nightly to void and sleeps 5-6 hours nightly. Patient reports insomnia issues, discussed recommended sleep tips.   Home Safety/Smoke Alarms: Feels safe in home. Smoke alarms in place.  Living environment; residence and Firearm Safety: 1-story house/ trailer, no firearms. Lives with husband no needs for DME, good support system Seat Belt Safety/Bike Helmet: Wears seat belt.     Objective:     Vitals: BP 121/60   Pulse 65   Resp 18   Ht 5\' 4"  (1.626 m)   Wt 144 lb (65.3 kg)   SpO2 98%   BMI 24.72 kg/m   Body mass index is 24.72 kg/m.  Advanced Directives 01/08/2018  Does Patient Have a Medical Advance Directive? Yes  Type of Paramedic of [place];Living will  Copy of [place] in Chart? 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[place] but in [place] since 1988    Past Medical History:  Diagnosis Date  . Allergic rhinitis   . Arthritis   . Asthma   . Diet-controlled type 2 diabetes mellitus ([place]) 09/2011 dx  . Diverticulosis   . Dyslipidemia   . Macular degeneration    genetic etiology, not age-related per retinal specailist  . Recurrent  kidney stones    Past Surgical History:  Procedure Laterality Date  . CATARACT EXTRACTION Bilateral 01/2015   L on 8/8, R on 8/22  . DILATION AND CURETTAGE OF UTERUS    . TONSILLECTOMY AND ADENOIDECTOMY     Family History  Problem Relation Age of Onset  . Heart disease Father   . Hyperlipidemia Father   . Hypertension Father   . Colon cancer Mother   . Arthritis Mother   . Cancer Mother        colon cancer  . Diabetes Brother   . Hyperlipidemia Son   . Diabetes Paternal Aunt    Social History   Socioeconomic History  . Marital status: Married    Spouse name: Not on file  . Number of children: 2  . Years of education: Not on file  . Highest education level: Not on file  Occupational History  . Occupation: Retired  Scientific laboratory technician  . Financial resource strain: Not hard at all  . Food insecurity:    Worry: Never true    Inability: Never true  . Transportation needs:    Medical: Yes    Non-medical: Yes  Tobacco Use  . Smoking status: Never Smoker  . Smokeless tobacco: Never Used  . 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 Alcohol use: Yes    Alcohol/week: 1.8 oz    Types: 3 Glasses  of wine per week    Comment: can't drink red wine anymore; on glass of white wine rarely  . Drug use: No  . Sexual activity: Yes  Lifestyle  . Physical activity:    Days per week: 5 days    Minutes per session: 50 min  . Stress: Not at all  Relationships  . Social connections:    Talks on phone: More than three times a week    Gets together: More than three times a week    Attends religious service: More than 4 times per year    Active member of club or organization: Yes    Attends meetings of clubs or organizations: More than 4 times per year    Relationship status: Married  Other Topics Concern  . Not on file  Social History Narrative   Regular exercise-yes    Caffeine Use-yes               Epworth Sleepiness Scale = 5 (as of 07/04/2015)    Outpatient Encounter Medications as of 01/08/2018  Medication Sig  . aspirin 81 MG tablet Take 81 mg by mouth daily.  . cetirizine (ZYRTEC) 10 MG tablet Take 10 mg by mouth daily.  . cholecalciferol (VITAMIN D) 1000 UNITS tablet Take 1,000 Units by mouth daily.  . clobetasol ointment (TEMOVATE) 0.05 % Use thin film twice daily for 2 weeks, then daily for 2 months, then 3 times per week for 2 months, then once a week for 2 months then stop.  . cyanocobalamin (,VITAMIN B-12,) 1000 MCG/ML injection INJECT 1 ML (1,000 MCG TOTAL) INTO THE MUSCLE EVERY 21 ( TWENTY-ONE) DAYS.  Marland Kitchen loperamide (IMODIUM) 2 MG capsule Take 2 mg by mouth as needed for diarrhea or loose stools. Reported on 07/26/2015  . Multiple Vitamins-Minerals (PRESERVISION AREDS PO) Take by mouth. Reported on 11/16/2015  . [DISCONTINUED] doxycycline (VIBRA-TABS) 100 MG tablet Take 1 tablet (100 mg total) by mouth 2 (two) times daily. (Patient not taking: Reported on 01/08/2018)   No facility-administered encounter medications on file as of 01/08/2018.     Activities of Daily Living In your present state of health, do you have any difficulty performing the following activities: 01/08/2018  Hearing? N  Vision? N  Difficulty concentrating or making decisions? N  Walking or climbing stairs? N  Dressing or bathing? N  Doing errands, shopping? N  Preparing Food and eating ? N  Using the Toilet? N  In the past six months, have you accidently leaked urine? N  Do you have problems with loss of bowel control? N  Managing your Medications? N  Managing your Finances? N  Housekeeping or managing your Housekeeping? N  Some recent data might be hidden    Patient Care Team: Hoyt Koch, MD as PCP - General (Internal Medicine) Lafayette Dragon, MD (Inactive) as Attending Physician (Gastroenterology) Clent Jacks, MD (Ophthalmology)    Assessment:    This is a routine wellness examination for Erica [name]. Physical assessment deferred to PCP.   Exercise Activities and Dietary recommendations Current Exercise Habits: Home exercise routine;Structured exercise class, Type of exercise: walking(Dances, swims), Time (Minutes): 40, Frequency (Times/Week): 5, Weekly Exercise (Minutes/Week): 200, Exercise limited by: None identified  Diet (meal preparation, eat out, water intake, caffeinated beverages, dairy products, fruits and vegetables): in general, a "healthy" diet  , well balanced eats a variety of fruits and vegetables daily, limits salt, fat/cholesterol, sugar,carbohydrates,caffeine, drinks 6-8 glasses of water daily.  Goals    . Exercise  150 minutes per week (moderate activity)     Will exercise more    . Patient Stated     Stay as healthy and as independent as possible. Enjoy life, family and friends.       Fall Risk Fall Risk  01/08/2018 03/19/2017 09/26/2015 09/01/2014 05/31/2013  Falls in the past year? No No No No No    Depression Screen PHQ 2/9 Scores 01/08/2018 11/27/2016 09/26/2015 09/01/2014  PHQ - 2 Score 0 0 3 0  PHQ- 9 Score 3 5 - -     Cognitive Function       Ad8 score reviewed for issues:  Issues making decisions: no  Less interest in hobbies / activities: no  Repeats questions, stories (family complaining): no  Trouble using ordinary gadgets (microwave, computer, phone):no  Forgets the month or year: no  Mismanaging finances: no  Remembering appts: no  Daily problems with thinking and/or memory: no Ad8 score is= 0  Immunization History  Administered Date(s) Administered  . Hepatitis A, Adult 02/24/2013  . Influenza Split 03/08/2014  . Influenza, High Dose Seasonal PF 04/18/2015, 03/19/2017  . Influenza,inj,Quad PF,6+ Mos 02/24/2013  . Influenza-Unspecified 04/03/2016  . Pneumococcal Conjugate-13 05/31/2013  . Pneumococcal-Unspecified 09/22/2008  . Td 06/24/2009  . Zoster 09/09/2007  . Zoster Recombinat  (Shingrix) 12/04/2016   Screening Tests Health Maintenance  Topic Date Due  . OPHTHALMOLOGY EXAM  12/28/2015  . FOOT EXAM  11/29/2017  . INFLUENZA VACCINE  01/22/2018  . HEMOGLOBIN A1C  03/22/2018  . URINE MICROALBUMIN  09/20/2018  . TETANUS/TDAP  06/25/2019  . DEXA SCAN  Completed  . PNA vac Low Risk Adult  Completed      Plan:     Scheduled an appointment with PCP on 01/09/18 to assess patient's complaint of  vertigo and headaches.   Continue doing brain stimulating activities (puzzles, reading, adult coloring books, staying active) to keep memory sharp.   Continue to eat heart healthy diet (full of fruits, vegetables, whole grains, lean protein, water--limit salt, fat, and sugar intake) and increase physical activity as tolerated.  I have personally reviewed and noted the following in the patient's chart:   . Medical and social history . Use of alcohol, tobacco or illicit drugs  . Current medications and supplements . Functional ability and status . Nutritional status . Physical activity . Advanced directives . List of other physicians . Vitals . Screenings to include cognitive, depression, and falls . Referrals and appointments  In addition, I have reviewed and discussed with patient certain preventive protocols, quality metrics, and best practice recommendations. A written personalized care plan for preventive services as well as general preventive health recommendations were provided to patient.     Michiel Cowboy, RN  01/08/2018

## 2018-01-08 ENCOUNTER — Ambulatory Visit (INDEPENDENT_AMBULATORY_CARE_PROVIDER_SITE_OTHER): Payer: Medicare Other | Admitting: *Deleted

## 2018-01-08 ENCOUNTER — Telehealth: Payer: Self-pay | Admitting: *Deleted

## 2018-01-08 VITALS — BP 121/60 | HR 65 | Resp 18 | Ht 64.0 in | Wt 144.0 lb

## 2018-01-08 DIAGNOSIS — Z0000 Encounter for general adult medical examination without abnormal findings: Secondary | ICD-10-CM | POA: Diagnosis not present

## 2018-01-08 DIAGNOSIS — E119 Type 2 diabetes mellitus without complications: Secondary | ICD-10-CM

## 2018-01-08 NOTE — Telephone Encounter (Signed)
Error

## 2018-01-08 NOTE — Patient Instructions (Addendum)
Continue doing brain stimulating activities (puzzles, reading, adult coloring books, staying active) to keep memory sharp.   Continue to eat heart healthy diet (full of fruits, vegetables, whole grains, lean protein, water--limit salt, fat, and sugar intake) and increase physical activity as tolerated.   Erica [name] , Thank you for taking time to come for your Medicare Wellness Visit. I appreciate your ongoing commitment to your health goals. Please review the following plan we discussed and let me know if I can assist you in the future.   These are the goals we discussed: Goals    . Exercise 150 minutes per week (moderate activity)     Will exercise more    . Patient Stated     Stay as healthy and as independent as possible. Enjoy life, family and friends.       This is a list of the screening recommended for you and due dates:  Health Maintenance  Topic Date Due  . Eye exam for diabetics  12/28/2015  . Complete foot exam   11/29/2017  . Flu Shot  01/22/2018  . Hemoglobin A1C  03/22/2018  . Urine Protein Check  09/20/2018  . Tetanus Vaccine  06/25/2019  . DEXA scan (bone density measurement)  Completed  . Pneumonia vaccines  Completed   Health Maintenance, Female Adopting a healthy lifestyle and getting preventive care can go a long way to promote health and wellness. Talk with your health care provider about what schedule of regular examinations is right for you. This is a good chance for you to check in with your provider about disease prevention and staying healthy. In between checkups, there are plenty of things you can do on your own. Experts have done a lot of research about which lifestyle changes and preventive measures are most likely to keep you healthy. Ask your health care provider for more information. Weight and diet Eat a healthy diet  Be sure to include plenty of vegetables, fruits, low-fat dairy products, and lean protein.  Do not eat a lot of foods high in  solid fats, added sugars, or salt.  Get regular exercise. This is one of the most important things you can do for your health. ? Most adults should exercise for at least 150 minutes each week. The exercise should increase your heart rate and make you sweat (moderate-intensity exercise). ? Most adults should also do strengthening exercises at least twice a week. This is in addition to the moderate-intensity exercise.  Maintain a healthy weight  Body mass index (BMI) is a measurement that can be used to identify possible weight problems. It estimates body fat based on height and weight. Your health care provider can help determine your BMI and help you achieve or maintain a healthy weight.  For females 62 years of age and older: ? A BMI below 18.5 is considered underweight. ? A BMI of 18.5 to 24.9 is normal. ? A BMI of 25 to 29.9 is considered overweight. ? A BMI of 30 and above is considered obese.  Watch levels of cholesterol and blood lipids  You should start having your blood tested for lipids and cholesterol at 79 years of age, then have this test every 5 years.  You may need to have your cholesterol levels checked more often if: ? Your lipid or cholesterol levels are high. ? You are older than 79 years of age. ? You are at high risk for heart disease.  Cancer screening Lung Cancer  Lung cancer screening  is recommended for adults 70-54 years old who are at high risk for lung cancer because of a history of smoking.  A yearly low-dose CT scan of the lungs is recommended for people who: ? Currently smoke. ? Have quit within the past 15 years. ? Have at least a 30-pack-year history of smoking. A pack year is smoking an average of one pack of cigarettes a day for 1 year.  Yearly screening should continue until it has been 15 years since you quit.  Yearly screening should stop if you develop a health problem that would prevent you from having lung cancer treatment.  Breast  Cancer  Practice breast self-awareness. This means understanding how your breasts normally appear and feel.  It also means doing regular breast self-exams. Let your health care provider know about any changes, no matter how small.  If you are in your 20s or 30s, you should have a clinical breast exam (CBE) by a health care provider every 1-3 years as part of a regular health exam.  If you are 36 or older, have a CBE every year. Also consider having a breast X-ray (mammogram) every year.  If you have a family history of breast cancer, talk to your health care provider about genetic screening.  If you are at high risk for breast cancer, talk to your health care provider about having an MRI and a mammogram every year.  Breast cancer gene (BRCA) assessment is recommended for women who have family members with BRCA-related cancers. BRCA-related cancers include: ? Breast. ? Ovarian. ? Tubal. ? Peritoneal cancers.  Results of the assessment will determine the need for genetic counseling and BRCA1 and BRCA2 testing.  Cervical Cancer Your health care provider may recommend that you be screened regularly for cancer of the pelvic organs (ovaries, uterus, and vagina). This screening involves a pelvic examination, including checking for microscopic changes to the surface of your cervix (Pap test). You may be encouraged to have this screening done every 3 years, beginning at age 107.  For women ages 23-65, health care providers may recommend pelvic exams and Pap testing every 3 years, or they may recommend the Pap and pelvic exam, combined with testing for human papilloma virus (HPV), every 5 years. Some types of HPV increase your risk of cervical cancer. Testing for HPV may also be done on women of any age with unclear Pap test results.  Other health care providers may not recommend any screening for nonpregnant women who are considered low risk for pelvic cancer and who do not have symptoms. Ask your  health care provider if a screening pelvic exam is right for you.  If you have had past treatment for cervical cancer or a condition that could lead to cancer, you need Pap tests and screening for cancer for at least 20 years after your treatment. If Pap tests have been discontinued, your risk factors (such as having a new sexual partner) need to be reassessed to determine if screening should resume. Some women have medical problems that increase the chance of getting cervical cancer. In these cases, your health care provider may recommend more frequent screening and Pap tests.  Colorectal Cancer  This type of cancer can be detected and often prevented.  Routine colorectal cancer screening usually begins at 79 years of age and continues through 79 years of age.  Your health care provider may recommend screening at an earlier age if you have risk factors for colon cancer.  Your health care provider  may also recommend using home test kits to check for hidden blood in the stool.  A small camera at the end of a tube can be used to examine your colon directly (sigmoidoscopy or colonoscopy). This is done to check for the earliest forms of colorectal cancer.  Routine screening usually begins at age 57.  Direct examination of the colon should be repeated every 5-10 years through 79 years of age. However, you may need to be screened more often if early forms of precancerous polyps or small growths are found.  Skin Cancer  Check your skin from head to toe regularly.  Tell your health care provider about any new moles or changes in moles, especially if there is a change in a mole's shape or color.  Also tell your health care provider if you have a mole that is larger than the size of a pencil eraser.  Always use sunscreen. Apply sunscreen liberally and repeatedly throughout the day.  Protect yourself by wearing long sleeves, pants, a wide-brimmed hat, and sunglasses whenever you are  outside.  Heart disease, diabetes, and high blood pressure  High blood pressure causes heart disease and increases the risk of stroke. High blood pressure is more likely to develop in: ? People who have blood pressure in the high end of the normal range (130-139/85-89 mm Hg). ? People who are overweight or obese. ? People who are African American.  If you are 39-32 years of age, have your blood pressure checked every 3-5 years. If you are 23 years of age or older, have your blood pressure checked every year. You should have your blood pressure measured twice-once when you are at a hospital or clinic, and once when you are not at a hospital or clinic. Record the average of the two measurements. To check your blood pressure when you are not at a hospital or clinic, you can use: ? An automated blood pressure machine at a pharmacy. ? A home blood pressure monitor.  If you are between 64 years and 61 years old, ask your health care provider if you should take aspirin to prevent strokes.  Have regular diabetes screenings. This involves taking a blood sample to check your fasting blood sugar level. ? If you are at a normal weight and have a low risk for diabetes, have this test once every three years after 79 years of age. ? If you are overweight and have a high risk for diabetes, consider being tested at a younger age or more often. Preventing infection Hepatitis B  If you have a higher risk for hepatitis B, you should be screened for this virus. You are considered at high risk for hepatitis B if: ? You were born in a country where hepatitis B is common. Ask your health care provider which countries are considered high risk. ? Your parents were born in a high-risk country, and you have not been immunized against hepatitis B (hepatitis B vaccine). ? You have HIV or AIDS. ? You use needles to inject street drugs. ? You live with someone who has hepatitis B. ? You have had sex with someone who has  hepatitis B. ? You get hemodialysis treatment. ? You take certain medicines for conditions, including cancer, organ transplantation, and autoimmune conditions.  Hepatitis C  Blood testing is recommended for: ? Everyone born from [birth year] through 1967. ? Anyone with known risk factors for hepatitis C.  Sexually transmitted infections (STIs)  You should be screened for sexually transmitted infections (  STIs) including gonorrhea and chlamydia if: ? You are sexually active and are younger than 79 years of age. ? You are older than 79 years of age and your health care provider tells you that you are at risk for this type of infection. ? Your sexual activity has changed since you were last screened and you are at an increased risk for chlamydia or gonorrhea. Ask your health care provider if you are at risk.  If you do not have HIV, but are at risk, it may be recommended that you take a prescription medicine daily to prevent HIV infection. This is called pre-exposure prophylaxis (PrEP). You are considered at risk if: ? You are sexually active and do not regularly use condoms or know the HIV status of your partner(s). ? You take drugs by injection. ? You are sexually active with a partner who has HIV.  Talk with your health care provider about whether you are at high risk of being infected with HIV. If you choose to begin PrEP, you should first be tested for HIV. You should then be tested every 3 months for as long as you are taking PrEP. Pregnancy  If you are premenopausal and you may become pregnant, ask your health care provider about preconception counseling.  If you may become pregnant, take 400 to 800 micrograms (mcg) of folic acid every day.  If you want to prevent pregnancy, talk to your health care provider about birth control (contraception). Osteoporosis and menopause  Osteoporosis is a disease in which the bones lose minerals and strength with aging. This can result in serious bone  fractures. Your risk for osteoporosis can be identified using a bone density scan.  If you are 19 years of age or older, or if you are at risk for osteoporosis and fractures, ask your health care provider if you should be screened.  Ask your health care provider whether you should take a calcium or vitamin D supplement to lower your risk for osteoporosis.  Menopause may have certain physical symptoms and risks.  Hormone replacement therapy may reduce some of these symptoms and risks. Talk to your health care provider about whether hormone replacement therapy is right for you. Follow these instructions at home:  Schedule regular health, dental, and eye exams.  Stay current with your immunizations.  Do not use any tobacco products including cigarettes, chewing tobacco, or electronic cigarettes.  If you are pregnant, do not drink alcohol.  If you are breastfeeding, limit how much and how often you drink alcohol.  Limit alcohol intake to no more than 1 drink per day for nonpregnant women. One drink equals 12 ounces of beer, 5 ounces of Tobias Avitabile, or 1 ounces of hard liquor.  Do not use street drugs.  Do not share needles.  Ask your health care provider for help if you need support or information about quitting drugs.  Tell your health care provider if you often feel depressed.  Tell your health care provider if you have ever been abused or do not feel safe at home. This information is not intended to replace advice given to you by your health care provider. Make sure you discuss any questions you have with your health care provider. Document Released: 12/24/2010 Document Revised: 11/16/2015 Document Reviewed: 03/14/2015 Elsevier Interactive Patient Education  [employer].

## 2018-01-08 NOTE — Progress Notes (Signed)
Medical screening examination/treatment/procedure(s) were performed by non-physician practitioner and as supervising physician I was immediately available for consultation/collaboration. I agree with above. Elizabeth A Crawford, MD 

## 2018-01-09 ENCOUNTER — Ambulatory Visit: Payer: Medicare Other | Admitting: Internal Medicine

## 2018-01-09 ENCOUNTER — Encounter: Payer: Self-pay | Admitting: Internal Medicine

## 2018-01-09 DIAGNOSIS — R42 Dizziness and giddiness: Secondary | ICD-10-CM

## 2018-01-09 NOTE — Patient Instructions (Signed)
We will give you the exercises to help the vertigo. You can take meclizine if needed.

## 2018-01-09 NOTE — Assessment & Plan Note (Addendum)
This could be meniere's disease if her hearing changes started at the same time as prior vertigo episode. Given epley maneuver and if no improvement will need vestibular therapy. Meclizine prn for vertigo symptoms.

## 2018-01-09 NOTE — Progress Notes (Signed)
   Subjective:    Patient ID: Erica [name], female    DOB: Nov 06, 1940, 77 y.o.   MRN: [id number]  HPI The patient is a 77 YO female coming in for vertigo going on for 1-2 weeks. She did have a prior flare of vertigo more than 5 years ago. She felt that she lost some hearing at that time which has not recovered. She currently is having dizziness with certain head movements, bending down, getting in and out of bed. Has not taken meclizine this time as she had some concerns about the safety. She did take this successfully in the past. Denies fevers or chills. Denies ear pain.   Review of Systems  Constitutional: Negative.   HENT: Negative.   Eyes: Negative.   Respiratory: Negative for cough, chest tightness and shortness of breath.   Cardiovascular: Negative for chest pain, palpitations and leg swelling.  Gastrointestinal: Negative for abdominal distention, abdominal pain, constipation, diarrhea, nausea and vomiting.  Musculoskeletal: Negative.   Neurological: Negative.       Objective:   Physical Exam  Constitutional: She is oriented to person, place, and time. She appears well-developed and well-nourished.  HENT:  Head: Normocephalic and atraumatic.  TMs normal  Eyes: EOM are normal.  Neck: Normal range of motion.  Cardiovascular: Normal rate and regular rhythm.  Pulmonary/Chest: Effort normal and breath sounds normal. No respiratory distress. She has no wheezes. She has no rales.  Abdominal: Soft. Bowel sounds are normal. She exhibits no distension. There is no tenderness. There is no rebound.  Musculoskeletal: She exhibits no edema.  Neurological: She is alert and oriented to person, place, and time. Coordination normal.  Skin: Skin is warm and dry.  Psychiatric: She has a normal mood and affect.   Vitals:   01/09/18 0921  BP: (!) 110/58  Pulse: 65  Temp: 97.8 F (36.6 C)  TempSrc: Oral  SpO2: 97%  Weight: 145 lb (65.8 kg)  Height: 5\' 4"  (1.626 m)      Assessment &  Plan:

## 2018-01-13 ENCOUNTER — Encounter: Payer: Self-pay | Admitting: Internal Medicine

## 2018-01-19 ENCOUNTER — Encounter: Payer: Self-pay | Admitting: Internal Medicine

## 2018-01-19 DIAGNOSIS — Z1239 Encounter for other screening for malignant neoplasm of breast: Secondary | ICD-10-CM

## 2018-02-20 ENCOUNTER — Encounter: Payer: Self-pay | Admitting: Internal Medicine

## 2018-02-20 LAB — HM MAMMOGRAPHY

## 2018-02-25 ENCOUNTER — Encounter: Payer: Self-pay | Admitting: Internal Medicine

## 2018-02-25 NOTE — Progress Notes (Unsigned)
Abstracted and sent to scan  

## 2018-02-25 NOTE — Progress Notes (Signed)
Abstracted and sent to scan  

## 2018-03-06 ENCOUNTER — Other Ambulatory Visit: Payer: Self-pay | Admitting: Family

## 2018-03-09 ENCOUNTER — Encounter: Payer: Self-pay | Admitting: Internal Medicine

## 2018-03-10 MED ORDER — CYANOCOBALAMIN 1000 MCG/ML IJ SOLN: 1000.0000 ug | INTRAMUSCULAR | 2 refills | Status: DC

## 2018-04-03 ENCOUNTER — Encounter: Payer: Self-pay | Admitting: Family Medicine

## 2018-04-03 ENCOUNTER — Ambulatory Visit (INDEPENDENT_AMBULATORY_CARE_PROVIDER_SITE_OTHER)
Admission: RE | Admit: 2018-04-03 | Discharge: 2018-04-03 | Disposition: A | Discharge status: Home / Self Care | Payer: Medicare Other | Source: Ambulatory Visit | Attending: Family Medicine | Admitting: Family Medicine

## 2018-04-03 ENCOUNTER — Ambulatory Visit: Payer: Medicare Other | Admitting: Family Medicine

## 2018-04-03 VITALS — BP 122/66 | HR 66 | Ht 64.0 in | Wt 148.0 lb

## 2018-04-03 DIAGNOSIS — M7062 Trochanteric bursitis, left hip: Secondary | ICD-10-CM | POA: Insufficient documentation

## 2018-04-03 DIAGNOSIS — M542 Cervicalgia: Secondary | ICD-10-CM

## 2018-04-03 NOTE — Assessment & Plan Note (Signed)
Has some tenderness on exam with no radicular symptoms.  Does not appear to be intra-articular in nature.  She was to avoid medicines and injections at this time. -Counseled on home exercise therapy. -Counseled supportive care. -If no improvement will consider injection

## 2018-04-03 NOTE — Patient Instructions (Signed)
Nice to meet you. Please try the exercises for your neck and your hip. You can try things like Aspercreme, Arnica gel Take tylenol 650 mg three times a day is the best evidence based medicine we have for arthritis.  Glucosamine sulfate 750mg  twice a day is a supplement that has been shown to help moderate to severe arthritis. Vitamin D 2000 IU daily Fish oil 2 grams daily.  Tumeric 500mg  twice daily.  Capsaicin topically up to four times a day may also help with pain. Please see me back in 2-3 weeks if your pain hasn't improved.

## 2018-04-03 NOTE — Assessment & Plan Note (Signed)
Pain likely multifactorial in nature with likely associated with arthritis and spasm.  No radicular symptoms on exam. -Counseled on home exercise therapy. -Counseled supportive care. -Counseled on posture. -X-ray today. -If no improvement will consider physical therapy or trigger point injections.

## 2018-04-03 NOTE — Progress Notes (Signed)
Erica [name] - 77 y.o. female MRN [id number]  Date of birth: [birth year]  SUBJECTIVE:  Including CC & ROS.  Chief Complaint  Patient presents with  . Neck Pain    Erica [name] is a 77 y.o. female that is presenting with left sided neck pain. Pain has been increasing the past three months. Pain located left lateral aspect and radiates down to her shoulder. She feels like it is her muscle. Difficultly looking up. Admits to decrease range of motion. Denies tingling or numbness. Denies injuries or surgeries.     Review of Systems  Constitutional: Negative for fever.  HENT: Negative for congestion.   Respiratory: Negative for cough.   Cardiovascular: Negative for chest pain.  Gastrointestinal: Negative for abdominal pain.  Musculoskeletal: Positive for neck pain.  Skin: Negative for color change.  Neurological: Negative for weakness.  Hematological: Negative for adenopathy.  Psychiatric/Behavioral: Negative for agitation.    HISTORY: Past Medical, Surgical, Social, and Family History Reviewed & Updated per EMR.   Pertinent Historical Findings include:  Past Medical History:  Diagnosis Date  . Allergic rhinitis   . Arthritis   . Asthma   . Diet-controlled type 2 diabetes mellitus ([place]) 09/2011 dx  . Diverticulosis   . Dyslipidemia   . Macular degeneration    genetic etiology, not age-related per retinal specailist  . Recurrent kidney stones     Past Surgical History:  Procedure Laterality Date  . CATARACT EXTRACTION Bilateral 01/2015   L on 8/8, R on 8/22  . DILATION AND CURETTAGE OF UTERUS    . TONSILLECTOMY AND ADENOIDECTOMY      Allergies  Allergen Reactions  . Scallops [Shellfish Allergy] Nausea And Vomiting    Scallops only  . Statins Other (See Comments)    Phobia of statins    Family History  Problem Relation Age of Onset  . Heart disease Father   . Hyperlipidemia Father   . Hypertension Father   . Colon cancer Mother   . Arthritis Mother   . Cancer  Mother        colon cancer  . Diabetes Brother   . Hyperlipidemia Son   . Diabetes Paternal Aunt      Social History   Socioeconomic History  . Marital status: Married    Spouse name: Not on file  . Number of children: 2  . Years of education: Not on file  . Highest education level: Not on file  Occupational History  . Occupation: Retired  Scientific laboratory technician  . Financial resource strain: Not hard at all  . Food insecurity:    Worry: Never true    Inability: Never true  . Transportation needs:    Medical: Yes    Non-medical: Yes  Tobacco Use  . Smoking status: Never Smoker  . Smokeless tobacco: Never Used  . 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[place] but in [place] since 1988  Substance and Sexual Activity  . Alcohol use: Yes    Alcohol/week: 3.0 standard drinks    Types: 3 Glasses of wine per week    Comment: can't drink red wine anymore; on glass of white wine rarely  . Drug use: No  . Sexual activity: Yes  Lifestyle  . Physical activity:    Days per week: 5 days    Minutes per session: 50 min  . Stress: Not at all  Relationships  . Social connections:    Talks on phone: More  than three times a week    Gets together: More than three times a week    Attends religious service: More than 4 times per year    Active member of club or organization: Yes    Attends meetings of clubs or organizations: More than 4 times per year    Relationship status: Married  . Intimate partner violence:    Fear of current or ex partner: No    Emotionally abused: No    Physically abused: No    Forced sexual activity: No  Other Topics Concern  . Not on file  Social History Narrative   Regular exercise-yes   Caffeine Use-yes               Epworth Sleepiness Scale = 5 (as of 07/04/2015)     PHYSICAL EXAM:  VS: BP 122/66   Pulse 66   Ht 5\' 4"  (1.626 m)   Wt 148 lb (67.1 kg)   SpO2 97%   BMI 25.40 kg/m  Physical Exam Gen:  NAD, alert, cooperative with exam, well-appearing ENT: normal lips, normal nasal mucosa,  Eye: normal EOM, normal conjunctiva and lids CV:  no edema, +2 pedal pulses   Resp: no accessory muscle use, non-labored,   Skin: no rashes, no areas of induration  Neuro: normal tone, normal sensation to touch Psych:  normal insight, alert and oriented MSK:  Neck: Inspection unremarkable. No palpable stepoffs. Tenderness to palpation of the left-sided paraspinal muscle and her left trapezius. Negative Spurling's maneuver. Full neck range of motion Grip strength and sensation normal in bilateral hands Strength good C4 to T1 distribution No sensory change to C4 to T1 Left hip: Normal internal and external rotation. Tenderness to palpation of the greater trochanter. Normal strength resistance with hip flexion. Negative straight leg raise bilaterally. Normal knee flexion extension. No strength resistance with plantarflexion and dorsiflexion Neurovascularly intact       ASSESSMENT & PLAN:   Trochanteric bursitis of left hip Has some tenderness on exam with no radicular symptoms.  Does not appear to be intra-articular in nature.  She was to avoid medicines and injections at this time. -Counseled on home exercise therapy. -Counseled supportive care. -If no improvement will consider injection  Neck pain Pain likely multifactorial in nature with likely associated with arthritis and spasm.  No radicular symptoms on exam. -Counseled on home exercise therapy. -Counseled supportive care. -Counseled on posture. -X-ray today. -If no improvement will consider physical therapy or trigger point injections.

## 2018-04-06 ENCOUNTER — Telehealth: Payer: Self-pay | Admitting: Family Medicine

## 2018-04-06 NOTE — Telephone Encounter (Signed)
Spoke with patient about xray results.   Rosemarie Ax, MD [facility] 04/06/2018, 10:04 AM

## 2018-05-18 ENCOUNTER — Encounter: Payer: Self-pay | Admitting: Internal Medicine

## 2018-05-20 ENCOUNTER — Encounter: Payer: Self-pay | Admitting: Family Medicine

## 2018-05-20 ENCOUNTER — Encounter: Payer: Self-pay | Admitting: Internal Medicine

## 2018-05-25 MED ORDER — "SYRINGE/NEEDLE (DISP) 25G X 1"" 3 ML MISC": 0 refills | Status: AC

## 2018-07-27 ENCOUNTER — Encounter: Payer: Self-pay | Admitting: Internal Medicine

## 2018-08-04 ENCOUNTER — Ambulatory Visit: Payer: Medicare Other | Admitting: Internal Medicine

## 2018-08-04 ENCOUNTER — Encounter: Payer: Self-pay | Admitting: Internal Medicine

## 2018-08-04 DIAGNOSIS — R220 Localized swelling, mass and lump, head: Secondary | ICD-10-CM

## 2018-08-04 MED ORDER — TRIAMCINOLONE ACETONIDE 0.1 % MT PSTE: 1.0000 "application " | PASTE | Freq: Two times a day (BID) | OROMUCOSAL | 12 refills | Status: DC

## 2018-08-04 NOTE — Progress Notes (Signed)
   Subjective:   Patient ID: Erica [name], female    DOB: 1941/01/26, 78 y.o.   MRN: [id number]  HPI The patient is a 78 YO female coming in for lump in her cheek. Sometimes will flatten out but then will go back up. Denies pain in it. Denies fevers or chills. Noticed it about months ago. Denies drainage. Denies weight change. Denies current or former tobacco abuse including no history of chewing tobacco. Denies current dental problems or bite problems.  Review of Systems  Constitutional: Negative.   HENT: Negative.        Cheek lump  Eyes: Negative.   Respiratory: Negative for cough, chest tightness and shortness of breath.   Cardiovascular: Negative for chest pain, palpitations and leg swelling.  Gastrointestinal: Negative for abdominal distention, abdominal pain, constipation, diarrhea, nausea and vomiting.  Musculoskeletal: Negative.   Skin: Negative.   Neurological: Negative.   Psychiatric/Behavioral: Negative.     Objective:  Physical Exam Constitutional:      Appearance: She is well-developed.  HENT:     Head: Normocephalic and atraumatic.     Comments: Some fibrous tissue right cheek middle without color change or open sore Neck:     Musculoskeletal: Normal range of motion.  Cardiovascular:     Rate and Rhythm: Normal rate and regular rhythm.  Pulmonary:     Effort: Pulmonary effort is normal. No respiratory distress.     Breath sounds: Normal breath sounds. No wheezing or rales.  Abdominal:     General: Bowel sounds are normal. There is no distension.     Palpations: Abdomen is soft.     Tenderness: There is no abdominal tenderness. There is no rebound.  Skin:    General: Skin is warm and dry.  Neurological:     Mental Status: She is alert and oriented to person, place, and time.     Coordination: Coordination normal.     Vitals:   08/04/18 1427  BP: 100/68  Pulse: 77  Temp: 98.5 F (36.9 C)  TempSrc: Oral  SpO2: 99%  Weight: 145 lb (65.8 kg)    Height: 5\' 4"  (1.626 m)    Assessment & Plan:

## 2018-08-04 NOTE — Assessment & Plan Note (Signed)
Rx for kenalog mouth paste to use.

## 2018-08-04 NOTE — Patient Instructions (Signed)
We have sent in the mouth paste to use twice a day on the spots.

## 2018-09-10 ENCOUNTER — Encounter: Payer: Self-pay | Admitting: Internal Medicine

## 2018-09-10 DIAGNOSIS — R6889 Other general symptoms and signs: Secondary | ICD-10-CM

## 2018-09-11 ENCOUNTER — Other Ambulatory Visit: Payer: Self-pay

## 2018-09-11 DIAGNOSIS — R6889 Other general symptoms and signs: Secondary | ICD-10-CM

## 2018-09-17 ENCOUNTER — Encounter: Payer: Self-pay | Admitting: Internal Medicine

## 2018-09-17 DIAGNOSIS — R059 Cough, unspecified: Secondary | ICD-10-CM

## 2018-09-17 DIAGNOSIS — R05 Cough: Secondary | ICD-10-CM

## 2018-09-17 LAB — NOVEL CORONAVIRUS, NAA: SARS-CoV-2, NAA: NOT DETECTED

## 2018-09-18 ENCOUNTER — Telehealth: Payer: Medicare Other | Admitting: Nurse Practitioner

## 2018-09-18 DIAGNOSIS — R05 Cough: Secondary | ICD-10-CM | POA: Diagnosis not present

## 2018-09-18 DIAGNOSIS — R059 Cough, unspecified: Secondary | ICD-10-CM

## 2018-09-18 MED ORDER — DOXYCYCLINE HYCLATE 100 MG PO TABS: 100.0000 mg | ORAL_TABLET | Freq: Two times a day (BID) | ORAL | 0 refills | Status: DC

## 2018-09-18 NOTE — Progress Notes (Signed)
We are sorry that you are not feeling well.  Here is how we plan to help!  Based on your presentation I believe you most likely have A cough due to bacteria.  When patients have a fever and a productive cough with a change in color or increased sputum production, we are concerned about bacterial bronchitis.  If left untreated it can progress to pneumonia.  If your symptoms do not improve with your treatment plan it is important that you contact your provider.   I have prescribed Doxycycline 100 mg twice a day for 7 days     In addition you may use A non-prescription cough medication called Mucinex DM: take 2 tablets every 12 hours.   From your responses in the eVisit questionnaire you describe inflammation in the upper respiratory tract which is causing a significant cough.  This is commonly called Bronchitis and has four common causes:    Allergies  Viral Infections  Acid Reflux  Bacterial Infection Allergies, viruses and acid reflux are treated by controlling symptoms or eliminating the cause. An example might be a cough caused by taking certain blood pressure medications. You stop the cough by changing the medication. Another example might be a cough caused by acid reflux. Controlling the reflux helps control the cough.  USE OF BRONCHODILATOR ("RESCUE") INHALERS: There is a risk from using your bronchodilator too frequently.  The risk is that over-reliance on a medication which only relaxes the muscles surrounding the breathing tubes can reduce the effectiveness of medications prescribed to reduce swelling and congestion of the tubes themselves.  Although you feel brief relief from the bronchodilator inhaler, your asthma may actually be worsening with the tubes becoming more swollen and filled with mucus.  This can delay other crucial treatments, such as oral steroid medications. If you need to use a bronchodilator inhaler daily, several times per day, you should discuss this with your  provider.  There are probably better treatments that could be used to keep your asthma under control.     HOME CARE . Only take medications as instructed by your medical team. . Complete the entire course of an antibiotic. . Drink plenty of fluids and get plenty of rest. . Avoid close contacts especially the very young and the elderly . Cover your mouth if you cough or cough into your sleeve. . Always remember to wash your hands . A steam or ultrasonic humidifier can help congestion.   GET HELP RIGHT AWAY IF: . You develop worsening fever. . You become short of breath . You cough up blood. . Your symptoms persist after you have completed your treatment plan MAKE SURE YOU   Understand these instructions.  Will watch your condition.  Will get help right away if you are not doing well or get worse.  Your e-visit answers were reviewed by a board certified advanced clinical practitioner to complete your personal care plan.  Depending on the condition, your plan could have included both over the counter or prescription medications. If there is a problem please reply  once you have received a response from your provider. Your safety is important to us.  If you have drug allergies check your prescription carefully.    You can use MyChart to ask questions about today's visit, request a non-urgent call back, or ask for a work or school excuse for 24 hours related to this e-Visit. If it has been greater than 24 hours you will need to follow up with your provider,   or enter a new e-Visit to address those concerns. You will get an e-mail in the next two days asking about your experience.  I hope that your e-visit has been valuable and will speed your recovery. Thank you for using e-visits.  5 minutes spent reviewing and documenting in chart.  

## 2018-09-30 ENCOUNTER — Other Ambulatory Visit: Payer: Self-pay

## 2018-09-30 ENCOUNTER — Ambulatory Visit (INDEPENDENT_AMBULATORY_CARE_PROVIDER_SITE_OTHER)
Admission: RE | Admit: 2018-09-30 | Discharge: 2018-09-30 | Disposition: A | Discharge status: Home / Self Care | Payer: Medicare Other | Source: Ambulatory Visit | Attending: Internal Medicine | Admitting: Internal Medicine

## 2018-09-30 DIAGNOSIS — R059 Cough, unspecified: Secondary | ICD-10-CM

## 2018-09-30 DIAGNOSIS — R05 Cough: Secondary | ICD-10-CM

## 2018-10-11 ENCOUNTER — Encounter: Payer: Self-pay | Admitting: Internal Medicine

## 2018-10-16 ENCOUNTER — Encounter: Payer: Self-pay | Admitting: Internal Medicine

## 2018-11-01 ENCOUNTER — Encounter: Payer: Self-pay | Admitting: Internal Medicine

## 2018-11-02 ENCOUNTER — Ambulatory Visit (INDEPENDENT_AMBULATORY_CARE_PROVIDER_SITE_OTHER)
Admission: RE | Admit: 2018-11-02 | Discharge: 2018-11-02 | Disposition: A | Discharge status: Home / Self Care | Payer: Medicare Other | Source: Ambulatory Visit | Attending: Internal Medicine | Admitting: Internal Medicine

## 2018-11-02 ENCOUNTER — Other Ambulatory Visit: Payer: Self-pay | Admitting: Internal Medicine

## 2018-11-02 ENCOUNTER — Encounter: Payer: Self-pay | Admitting: Internal Medicine

## 2018-11-02 ENCOUNTER — Ambulatory Visit (INDEPENDENT_AMBULATORY_CARE_PROVIDER_SITE_OTHER): Payer: Medicare Other | Admitting: Internal Medicine

## 2018-11-02 ENCOUNTER — Other Ambulatory Visit (INDEPENDENT_AMBULATORY_CARE_PROVIDER_SITE_OTHER): Payer: Medicare Other

## 2018-11-02 DIAGNOSIS — R059 Cough, unspecified: Secondary | ICD-10-CM | POA: Insufficient documentation

## 2018-11-02 DIAGNOSIS — R05 Cough: Secondary | ICD-10-CM | POA: Diagnosis not present

## 2018-11-02 LAB — COMPREHENSIVE METABOLIC PANEL
ALT: 11 U/L (ref 0–35)
AST: 12 U/L (ref 0–37)
Albumin: 4.1 g/dL (ref 3.5–5.2)
Alkaline Phosphatase: 66 U/L (ref 39–117)
BUN: 18 mg/dL (ref 6–23)
CO2: 27 mEq/L (ref 19–32)
Calcium: 9.2 mg/dL (ref 8.4–10.5)
Chloride: 102 mEq/L (ref 96–112)
Creatinine, Ser: 0.95 mg/dL (ref 0.40–1.20)
GFR: 56.57 mL/min — ABNORMAL LOW (ref >60.00)
Glucose, Bld: 115 mg/dL — ABNORMAL HIGH (ref 70–99)
Potassium: 4.1 mEq/L (ref 3.5–5.1)
Sodium: 138 mEq/L (ref 135–145)
Total Bilirubin: 0.4 mg/dL (ref 0.2–1.2)
Total Protein: 7 g/dL (ref 6.0–8.3)

## 2018-11-02 LAB — CBC
HCT: 44 % (ref 36.0–46.0)
Hemoglobin: 14.7 g/dL (ref 12.0–15.0)
MCHC: 33.4 g/dL (ref 30.0–36.0)
MCV: 91.1 fl (ref 78.0–100.0)
Platelets: 298 10*3/uL (ref 150.0–400.0)
RBC: 4.83 Mil/uL (ref 3.87–5.11)
RDW: 13.6 % (ref 11.5–15.5)
WBC: 9.5 10*3/uL (ref 4.0–10.5)

## 2018-11-02 LAB — TSH: TSH: 1.35 u[IU]/mL (ref 0.35–4.50)

## 2018-11-02 LAB — BRAIN NATRIURETIC PEPTIDE: Pro B Natriuretic peptide (BNP): 27 pg/mL (ref 0.0–100.0)

## 2018-11-02 MED ORDER — PREDNISONE 20 MG PO TABS: 40.0000 mg | ORAL_TABLET | Freq: Every day | ORAL | 0 refills | Status: DC

## 2018-11-02 NOTE — Assessment & Plan Note (Signed)
Ordered CXR and CBC, CMP, BNP, TSH to rule out alternate etiology. If no etiology can try steroid pack to cover post infectious inflammation.

## 2018-11-02 NOTE — Progress Notes (Signed)
Virtual Visit via Video Note  I connected with Erica [name] on 11/02/18 at 10:00 AM EDT by a video enabled telemedicine application and verified that I am speaking with the correct person using two identifiers.  The patient and the provider were at separate locations throughout the entire encounter.   I discussed the limitations of evaluation and management by telemedicine and the availability of in person appointments. The patient expressed understanding and agreed to proceed.  History of Present Illness: The patient is a 78 y.o. female with visit for persistent cough and SOB for 3 months. Started back in March when she had a flu-like illness. Testing for covid-19 a week or two into the symptoms was negative. Started improving within a few weeks but symptoms did not fully resolve. She got cough after the flu like symptoms resolved. She has been coughing ever since. The cough is dry and irritable. She has SOB worse with activity. Not SOB with lying flat. Denies weight gain. Loss of about 4 pounds from this. Is not eating and drinking normally. She denies fevers or chills in the last 3-4 weeks. Had x-ray around the first week in April to rule out pneumonia which was normal. She has tried otc cough medications without relief. Did a course of doxycycline in early April which did not seem to affect the symptoms. She is still very fatigued. Measured oxygen levels at home do not go below 94%. She is not wheezing that she knows of. No history of lung disease. Has good days and bad days but overall not improving. Denies chest pains but has tightness with breathing. Overall it is stable and not improving. Has tried doxycycline and otc cough and cold medications without relief.   Observations/Objective: Appearance: normal, breathing appears normal with full sentences without dyspnea, casual grooming, abdomen does not appear distended, throat normal, memory normal, mental status is A and O times 3, EOM  intact  Assessment and Plan: See problem oriented charting  Follow Up Instructions: CXR and labs  I discussed the assessment and treatment plan with the patient. The patient was provided an opportunity to ask questions and all were answered. The patient agreed with the plan and demonstrated an understanding of the instructions.  Visit time 25 minutes: greater than 50% of that time was spent in face to face counseling and coordination of care with the patient: counseled about cough and etiology and potential of prior covid-19 and persistence of symptoms.    The patient was advised to call back or seek an in-person evaluation if the symptoms worsen or if the condition fails to improve as anticipated.  Hoyt Koch, MD

## 2018-11-11 ENCOUNTER — Encounter: Payer: Self-pay | Admitting: Internal Medicine

## 2018-11-12 MED ORDER — FLUTICASONE FUROATE-VILANTEROL 100-25 MCG/INH IN AEPB: 1.0000 | INHALATION_SPRAY | Freq: Every day | RESPIRATORY_TRACT | 3 refills | Status: DC

## 2018-11-14 ENCOUNTER — Other Ambulatory Visit: Payer: Self-pay | Admitting: Internal Medicine

## 2018-12-17 ENCOUNTER — Encounter: Payer: Self-pay | Admitting: Internal Medicine

## 2018-12-18 ENCOUNTER — Other Ambulatory Visit: Payer: Self-pay

## 2018-12-18 ENCOUNTER — Ambulatory Visit (INDEPENDENT_AMBULATORY_CARE_PROVIDER_SITE_OTHER): Payer: Medicare Other | Admitting: Internal Medicine

## 2018-12-18 ENCOUNTER — Encounter: Payer: Self-pay | Admitting: Internal Medicine

## 2018-12-18 VITALS — BP 140/70 | HR 74 | Temp 98.6°F | Ht 64.0 in | Wt 145.0 lb

## 2018-12-18 DIAGNOSIS — R0789 Other chest pain: Secondary | ICD-10-CM | POA: Diagnosis not present

## 2018-12-18 NOTE — Telephone Encounter (Signed)
Appointment has been made for 6/26.

## 2018-12-18 NOTE — Patient Instructions (Signed)
The EKG is the same as last year. This could be related to the recent illness.

## 2018-12-18 NOTE — Progress Notes (Signed)
   Subjective:   Patient ID: Erica [name], female    DOB: 26-Jan-1941, 78 y.o.   MRN: [id number]  HPI The patient is an 78 YO female coming in for some shock like chest pains. No SOB or sweats or numbness with it. Does not occur with any pattern. She was mowing lawn and did not have a problem. She denies chest pain with moving or SOB. She denies fevers or chills. She is finally feeling better from illness (lasted March to May). Denies change in diet or caffeine. Last mammogram Aug 2019 normal, no new breast lump or nipple discharge. She denies GERD symptoms. Denies post nasal drip.   PMH, [facility], social history reviewed and updated.   Review of Systems  Constitutional: Negative.   HENT: Negative.   Eyes: Negative.   Respiratory: Negative for cough, chest tightness and shortness of breath.   Cardiovascular: Positive for chest pain. Negative for palpitations and leg swelling.  Gastrointestinal: Negative for abdominal distention, abdominal pain, constipation, diarrhea, nausea and vomiting.  Musculoskeletal: Negative.   Skin: Negative.   Neurological: Negative.   Psychiatric/Behavioral: Negative.     Objective:  Physical Exam Constitutional:      Appearance: She is well-developed.  HENT:     Head: Normocephalic and atraumatic.  Neck:     Musculoskeletal: Normal range of motion.  Cardiovascular:     Rate and Rhythm: Normal rate and regular rhythm.  Pulmonary:     Effort: Pulmonary effort is normal. No respiratory distress.     Breath sounds: Normal breath sounds. No wheezing or rales.  Abdominal:     General: Bowel sounds are normal. There is no distension.     Palpations: Abdomen is soft.     Tenderness: There is no abdominal tenderness. There is no rebound.  Skin:    General: Skin is warm and dry.  Neurological:     Mental Status: She is alert and oriented to person, place, and time.     Coordination: Coordination normal.     Vitals:   12/18/18 1409  BP: 140/70  Pulse: 74   Temp: 98.6 F (37 C)  TempSrc: Oral  SpO2: 96%  Weight: 145 lb (65.8 kg)  Height: 5\' 4"  (1.626 m)   EKG: Rate 71, axis normal, interval normal, RSR' V1, no st or t wave changes, no significant change when compared to 2019  Assessment & Plan:  Visit time 25 minutes: greater than 50% of that time was spent in face to face counseling and coordination of care with the patient: counseled about likely etiology of her prior illness as well as the reason she finally improved, also possible etiologies about the new chest pain and various treatment and EKG findings

## 2018-12-18 NOTE — Assessment & Plan Note (Signed)
EKG done and no changes. Unclear etiology and will monitor symptoms. If persistent can undergo further evaluation. Not typical for cardiac related.

## 2019-02-11 ENCOUNTER — Ambulatory Visit: Payer: Self-pay

## 2019-02-12 ENCOUNTER — Ambulatory Visit (INDEPENDENT_AMBULATORY_CARE_PROVIDER_SITE_OTHER): Payer: Medicare Other | Admitting: *Deleted

## 2019-02-12 DIAGNOSIS — Z0000 Encounter for general adult medical examination without abnormal findings: Secondary | ICD-10-CM | POA: Diagnosis not present

## 2019-02-12 NOTE — Progress Notes (Addendum)
Subjective:   Erica [name] is a 78 y.o. female who presents for Medicare Annual (Subsequent) preventive examination. I connected with patient by a telephone and verified that I am speaking with the correct person using two identifiers. Patient stated full name and DOB. Patient gave permission to continue with telephonic visit. Patient's location was at home and Nurse's location was at [place] office.   Review of Systems:   Cardiac Risk Factors include: advanced age (>30men, >59 women);diabetes mellitus;dyslipidemia Sleep patterns: does not get up to void, gets up 0-1 times nightly to void and sleeps 6-7 hours nightly.    Home Safety/Smoke Alarms: Feels safe in home. Smoke alarms in place.  Living environment; residence and Firearm Safety: 1-story house/ trailer. Lives with husband, no needs for DME, good support system Seat Belt Safety/Bike Helmet: Wears seat belt.      Objective:     Vitals: There were no vitals taken for this visit.  There is no height or weight on file to calculate BMI.  Advanced Directives 02/12/2019 01/08/2018  Does Patient Have a Medical Advance Directive? Yes Yes  Type of Paramedic of [place];Living will [place];Living will  Copy of [place] in Chart? 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[place] but in [place] since 1988  Past Medical History:  Diagnosis Date  . Allergic rhinitis   . Arthritis   . Asthma   . Diet-controlled type 2 diabetes mellitus ([place]) 09/2011 dx  . Diverticulosis   . Dyslipidemia   . Macular degeneration    genetic etiology,  not age-related per retinal specailist  . Recurrent kidney stones    Past Surgical History:  Procedure Laterality Date  . CATARACT EXTRACTION Bilateral 01/2015   L on 8/8, R on 8/22  . DILATION AND CURETTAGE OF UTERUS    . TONSILLECTOMY AND ADENOIDECTOMY     Family History  Problem Relation Age of Onset  . Heart disease Father   . Hyperlipidemia Father   . Hypertension Father   . Colon cancer Mother   . Arthritis Mother   . Cancer Mother        colon cancer  . Diabetes Brother   . Hyperlipidemia Son   . Diabetes Paternal Aunt    Social History   Socioeconomic History  . Marital status: Married    Spouse name: Not on file  . Number of children: 2  . Years of education: Not on file  . Highest education level: Not on file  Occupational History  . Occupation: Retired  Scientific laboratory technician  . Financial resource strain: Not hard at all  . Food insecurity    Worry: Never true    Inability: Never true  . Transportation needs    Medical: Yes    Non-medical: Yes  Tobacco Use  . Smoking status: Never Smoker  . Smokeless tobacco: Never Used  . 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 Alcohol use: Yes    Alcohol/week: 3.0 standard drinks  Types: 3 Glasses of wine per week    Comment: can't drink red wine anymore; on glass of white wine rarely  . Drug use: No  . Sexual activity: Yes  Lifestyle  . Physical activity    Days per week: 5 days    Minutes per session: 30 min  . Stress: Not at all  Relationships  . Social connections    Talks on phone: More than three times a week    Gets together: More than three times a week    Attends religious service: More than 4 times per year    Active member of club or organization: Yes    Attends meetings of clubs or organizations: More than 4 times per year    Relationship status: Married  Other Topics Concern  . Not on  file  Social History Narrative   Regular exercise-yes   Caffeine Use-yes               Epworth Sleepiness Scale = 5 (as of 07/04/2015)    Outpatient Encounter Medications as of 02/12/2019  Medication Sig  . aspirin 81 MG tablet Take 81 mg by mouth daily.  . cetirizine (ZYRTEC) 10 MG tablet Take 10 mg by mouth daily.  . clobetasol ointment (TEMOVATE) 0.05 % Use thin film twice daily for 2 weeks, then daily for 2 months, then 3 times per week for 2 months, then once a week for 2 months then stop.  . cyanocobalamin (,VITAMIN B-12,) 1000 MCG/ML injection INJECT 1 ML (1,000 MCG TOTAL) INTO THE MUSCLE EVERY 21 ( TWENTY-ONE) DAYS.  Marland Kitchen loperamide (IMODIUM) 2 MG capsule Take 2 mg by mouth as needed for diarrhea or loose stools. Reported on 07/26/2015  . Multiple Vitamins-Minerals (PRESERVISION AREDS PO) Take by mouth. Reported on 11/16/2015  . SYRINGE-NEEDLE, DISP, 3 ML (BD SAFETYGLIDE SYRINGE/NEEDLE) 25G X 1" 3 ML MISC Used to inject B12   No facility-administered encounter medications on file as of 02/12/2019.     Activities of Daily Living In your present state of health, do you have any difficulty performing the following activities: 02/12/2019  Hearing? N  Vision? N  Difficulty concentrating or making decisions? N  Walking or climbing stairs? N  Dressing or bathing? N  Doing errands, shopping? N  Preparing Food and eating ? N  Using the Toilet? N  In the past six months, have you accidently leaked urine? N  Do you have problems with loss of bowel control? N  Managing your Medications? N  Managing your Finances? N  Housekeeping or managing your Housekeeping? N  Some recent data might be hidden    Patient Care Team: Hoyt Koch, MD as PCP - General (Internal Medicine) Clent Jacks, MD (Ophthalmology)    Assessment:   This is a routine wellness examination for Erica [name]. Physical assessment deferred to PCP.   Exercise Activities and Dietary recommendations Current Exercise  Habits: Home exercise routine, Time (Minutes): 30, Frequency (Times/Week): 4, Weekly Exercise (Minutes/Week): 120, Intensity: Mild, Exercise limited by: None identified  Diet (meal preparation, eat out, water intake, caffeinated beverages, dairy products, fruits and vegetables): in general, a "healthy" diet  , well balanced. eats a variety of fruits and vegetables daily, limits salt, fat/cholesterol, sugar,carbohydrates,caffeine, drinks 6-8 glasses of water daily.  Goals    . Exercise 150 minutes per week (moderate activity)     Will exercise more    . Patient Stated     Stay as healthy and as independent as possible. Enjoy  life, family and friends.    . Patient Stated       Fall Risk Fall Risk  02/12/2019 01/08/2018 03/19/2017 09/26/2015 09/01/2014  Falls in the past year? 0 No No No No  Number falls in past yr: 0 - - - -   Depression Screen PHQ 2/9 Scores 02/12/2019 01/08/2018 11/27/2016 09/26/2015  PHQ - 2 Score 0 0 0 3  PHQ- 9 Score - 3 5 -     Cognitive Function       Ad8 score reviewed for issues:  Issues making decisions: no  Less interest in hobbies / activities: no  Repeats questions, stories (family complaining): no  Trouble using ordinary gadgets (microwave, computer, phone):no  Forgets the month or year: no  Mismanaging finances: no  Remembering appts: no  Daily problems with thinking and/or memory: no Ad8 score is= 0  Immunization History  Administered Date(s) Administered  . Hepatitis A, Adult 02/24/2013  . Influenza Split 03/08/2014  . Influenza, High Dose Seasonal PF 04/18/2015, 03/19/2017  . Influenza,inj,Quad PF,6+ Mos 02/24/2013  . Influenza-Unspecified 04/03/2016, 04/02/2018  . Pneumococcal Conjugate-13 05/31/2013  . Pneumococcal-Unspecified 09/22/2008  . Td 06/24/2009  . Zoster 09/09/2007  . Zoster Recombinat (Shingrix) 12/04/2016   Screening Tests Health Maintenance  Topic Date Due  . OPHTHALMOLOGY EXAM  12/31/2017  . HEMOGLOBIN A1C   03/22/2018  . URINE MICROALBUMIN  09/20/2018  . FOOT EXAM  01/09/2019  . INFLUENZA VACCINE  01/23/2019  . TETANUS/TDAP  06/25/2019  . DEXA SCAN  Completed  . PNA vac Low Risk Adult  Completed      Plan:    Reviewed health maintenance screenings with patient today and relevant education, vaccines, and/or referrals were provided.   Continue to eat heart healthy diet (full of fruits, vegetables, whole grains, lean protein, water--limit salt, fat, and sugar intake) and increase physical activity as tolerated.  Continue doing brain stimulating activities (puzzles, reading, adult coloring books, staying active) to keep memory sharp.   I have personally reviewed and noted the following in the patient's chart:   . Medical and social history . Use of alcohol, tobacco or illicit drugs  . Current medications and supplements . Functional ability and status . Nutritional status . Physical activity . Advanced directives . List of other physicians . Screenings to include cognitive, depression, and falls . Referrals and appointments  In addition, I have reviewed and discussed with patient certain preventive protocols, quality metrics, and best practice recommendations. A written personalized care plan for preventive services as well as general preventive health recommendations were provided to patient.     Michiel Cowboy, RN  02/12/2019   Medical screening examination/treatment/procedure(s) were performed by non-physician practitioner and as supervising physician I was immediately available for consultation/collaboration. I agree with above. Binnie Rail, MD

## 2019-04-06 ENCOUNTER — Encounter: Payer: Self-pay | Admitting: Internal Medicine

## 2019-04-08 ENCOUNTER — Other Ambulatory Visit: Payer: Self-pay

## 2019-04-08 ENCOUNTER — Ambulatory Visit (INDEPENDENT_AMBULATORY_CARE_PROVIDER_SITE_OTHER): Payer: Medicare Other

## 2019-04-08 DIAGNOSIS — Z23 Encounter for immunization: Secondary | ICD-10-CM | POA: Diagnosis not present

## 2019-07-16 ENCOUNTER — Encounter: Payer: Self-pay | Admitting: Internal Medicine

## 2019-07-19 NOTE — Telephone Encounter (Deleted)
This is the message that I want to send to the patient but not sure if you approve. Let me know and I can delete this message.   The information that I have regarding the COVID vaccine appointments that were canceled is because the State denied sending [place] anymore at this time. It is unclear exactly why but we are holding all 1st vaccine appointments at until we can secure additional vaccines. All patients that had their appointment canceled will be contacted to reschedule at that time. All patients with the 2nd vaccine scheduled will keep their appointments. It has been challenging and continues to be that way but there is a light at the end of the tunnel and we will see this through.   Let me know if you have any additional concerns.

## 2019-07-20 ENCOUNTER — Ambulatory Visit: Payer: Medicare PPO

## 2019-07-29 ENCOUNTER — Ambulatory Visit: Payer: Medicare PPO | Attending: Internal Medicine

## 2019-07-29 DIAGNOSIS — Z23 Encounter for immunization: Secondary | ICD-10-CM

## 2019-07-29 NOTE — Progress Notes (Signed)
   Covid-19 Vaccination Clinic  Name:  Erica [name]    MRN: [id number] DOB: 12-09-1940  07/29/2019  [name] was observed post Covid-19 immunization for 15 minutes without incidence. She was provided with Vaccine Information Sheet and instruction to access the V-Safe system.   [name] was instructed to call 911 with any severe reactions post vaccine: Marland Kitchen Difficulty breathing  . Swelling of your face and throat  . A fast heartbeat  . A bad rash all over your body  . Dizziness and weakness    Immunizations Administered    Name Date Dose VIS Date Route   Pfizer COVID-19 Vaccine 07/29/2019  5:01 PM 0.3 mL 06/04/2019 Intramuscular   Manufacturer: [place]   Lot: [id number]   [place]: [id number]

## 2019-08-24 ENCOUNTER — Ambulatory Visit: Payer: Medicare PPO | Attending: Internal Medicine

## 2019-08-24 DIAGNOSIS — Z23 Encounter for immunization: Secondary | ICD-10-CM | POA: Insufficient documentation

## 2019-08-24 NOTE — Progress Notes (Signed)
   Covid-19 Vaccination Clinic  Name:  Erica [name]    MRN: [id number] DOB: Mar 13, 1941  08/24/2019  Erica [name] was observed post Covid-19 immunization for 15 minutes without incident. She was provided with Vaccine Information Sheet and instruction to access the V-Safe system.   Erica [name] was instructed to call 911 with any severe reactions post vaccine: Marland Kitchen Difficulty breathing  . Swelling of face and throat  . A fast heartbeat  . A bad rash all over body  . Dizziness and weakness   Immunizations Administered    Name Date Dose VIS Date Route   Pfizer COVID-19 Vaccine 08/24/2019  1:23 PM 0.3 mL 06/04/2019 Intramuscular   Manufacturer: [place]   Lot: [id number]   [place]: [id number]

## 2019-09-20 DIAGNOSIS — H353132 Nonexudative age-related macular degeneration, bilateral, intermediate dry stage: Secondary | ICD-10-CM | POA: Diagnosis not present

## 2019-09-20 DIAGNOSIS — H3554 Dystrophies primarily involving the retinal pigment epithelium: Secondary | ICD-10-CM | POA: Diagnosis not present

## 2019-09-20 DIAGNOSIS — H43812 Vitreous degeneration, left eye: Secondary | ICD-10-CM | POA: Diagnosis not present

## 2019-09-20 DIAGNOSIS — H43821 Vitreomacular adhesion, right eye: Secondary | ICD-10-CM | POA: Diagnosis not present

## 2019-09-21 ENCOUNTER — Encounter: Payer: Self-pay | Admitting: Internal Medicine

## 2019-09-22 DIAGNOSIS — Z7982 Long term (current) use of aspirin: Secondary | ICD-10-CM | POA: Diagnosis not present

## 2019-09-22 DIAGNOSIS — Z809 Family history of malignant neoplasm, unspecified: Secondary | ICD-10-CM | POA: Diagnosis not present

## 2019-09-22 DIAGNOSIS — Z811 Family history of alcohol abuse and dependence: Secondary | ICD-10-CM | POA: Diagnosis not present

## 2019-09-22 DIAGNOSIS — M1990 Unspecified osteoarthritis, unspecified site: Secondary | ICD-10-CM | POA: Diagnosis not present

## 2019-09-22 DIAGNOSIS — K219 Gastro-esophageal reflux disease without esophagitis: Secondary | ICD-10-CM | POA: Diagnosis not present

## 2019-09-22 DIAGNOSIS — Z8249 Family history of ischemic heart disease and other diseases of the circulatory system: Secondary | ICD-10-CM | POA: Diagnosis not present

## 2019-10-05 ENCOUNTER — Encounter: Payer: Self-pay | Admitting: Internal Medicine

## 2019-10-05 ENCOUNTER — Ambulatory Visit (INDEPENDENT_AMBULATORY_CARE_PROVIDER_SITE_OTHER): Payer: Medicare PPO | Admitting: Internal Medicine

## 2019-10-05 ENCOUNTER — Other Ambulatory Visit: Payer: Self-pay

## 2019-10-05 VITALS — BP 126/78 | HR 77 | Temp 98.2°F | Ht 64.0 in | Wt 149.8 lb

## 2019-10-05 DIAGNOSIS — E538 Deficiency of other specified B group vitamins: Secondary | ICD-10-CM

## 2019-10-05 DIAGNOSIS — E119 Type 2 diabetes mellitus without complications: Secondary | ICD-10-CM | POA: Diagnosis not present

## 2019-10-05 DIAGNOSIS — Z0000 Encounter for general adult medical examination without abnormal findings: Secondary | ICD-10-CM | POA: Diagnosis not present

## 2019-10-05 LAB — URINALYSIS, ROUTINE W REFLEX MICROSCOPIC
Bilirubin Urine: NEGATIVE
Hgb urine dipstick: NEGATIVE
Ketones, ur: NEGATIVE
Leukocytes,Ua: NEGATIVE
Nitrite: NEGATIVE
RBC / HPF: NONE SEEN (ref 0–?)
Specific Gravity, Urine: 1.02 (ref 1.000–1.030)
Total Protein, Urine: NEGATIVE
Urine Glucose: NEGATIVE
Urobilinogen, UA: 0.2 (ref 0.0–1.0)
WBC, UA: NONE SEEN (ref 0–?)
pH: 5.5 (ref 5.0–8.0)

## 2019-10-05 LAB — COMPREHENSIVE METABOLIC PANEL
ALT: 20 U/L (ref 0–35)
AST: 17 U/L (ref 0–37)
Albumin: 3.9 g/dL (ref 3.5–5.2)
Alkaline Phosphatase: 62 U/L (ref 39–117)
BUN: 15 mg/dL (ref 6–23)
CO2: 25 mEq/L (ref 19–32)
Calcium: 8.7 mg/dL (ref 8.4–10.5)
Chloride: 109 mEq/L (ref 96–112)
Creatinine, Ser: 0.77 mg/dL (ref 0.40–1.20)
GFR: 71.92 mL/min (ref >60.00)
Glucose, Bld: 101 mg/dL — ABNORMAL HIGH (ref 70–99)
Potassium: 3.7 mEq/L (ref 3.5–5.1)
Sodium: 140 mEq/L (ref 135–145)
Total Bilirubin: 0.3 mg/dL (ref 0.2–1.2)
Total Protein: 6.3 g/dL (ref 6.0–8.3)

## 2019-10-05 LAB — MICROALBUMIN / CREATININE URINE RATIO
Creatinine,U: 63.8 mg/dL
Microalb Creat Ratio: 1.1 mg/g (ref 0.0–30.0)
Microalb, Ur: <0.7 mg/dL (ref 0.0–1.9)

## 2019-10-05 LAB — CBC
HCT: 40 % (ref 36.0–46.0)
Hemoglobin: 13.3 g/dL (ref 12.0–15.0)
MCHC: 33.2 g/dL (ref 30.0–36.0)
MCV: 92.3 fl (ref 78.0–100.0)
Platelets: 245 10*3/uL (ref 150.0–400.0)
RBC: 4.33 Mil/uL (ref 3.87–5.11)
RDW: 13.5 % (ref 11.5–15.5)
WBC: 6.6 10*3/uL (ref 4.0–10.5)

## 2019-10-05 LAB — LIPID PANEL
Cholesterol: 162 mg/dL (ref 0–200)
HDL: 38 mg/dL — ABNORMAL LOW (ref >39.00)
LDL Cholesterol: 96 mg/dL (ref 0–99)
NonHDL: 124.46
Total CHOL/HDL Ratio: 4
Triglycerides: 140 mg/dL (ref 0.0–149.0)
VLDL: 28 mg/dL (ref 0.0–40.0)

## 2019-10-05 LAB — HEMOGLOBIN A1C: Hgb A1c MFr Bld: 6.3 % (ref 4.6–6.5)

## 2019-10-05 NOTE — Assessment & Plan Note (Signed)
Checking HgA1c, foot exam done. Reminded about eye exam. Checking microalbumin to creatinine ratio. Previous diagnosis and in last several years all readings in pre-diabetes range. Not complicated.

## 2019-10-05 NOTE — Patient Instructions (Signed)
Health Maintenance, Female Adopting a healthy lifestyle and getting preventive care are important in promoting health and wellness. Ask your health care provider about:  The right schedule for you to have regular tests and exams.  Things you can do on your own to prevent diseases and keep yourself healthy. What should I know about diet, weight, and exercise? Eat a healthy diet   Eat a diet that includes plenty of vegetables, fruits, low-fat dairy products, and lean protein.  Do not eat a lot of foods that are high in solid fats, added sugars, or sodium. Maintain a healthy weight Body mass index (BMI) is used to identify weight problems. It estimates body fat based on height and weight. Your health care provider can help determine your BMI and help you achieve or maintain a healthy weight. Get regular exercise Get regular exercise. This is one of the most important things you can do for your health. Most adults should:  Exercise for at least 150 minutes each week. The exercise should increase your heart rate and make you sweat (moderate-intensity exercise).  Do strengthening exercises at least twice a week. This is in addition to the moderate-intensity exercise.  Spend less time sitting. Even light physical activity can be beneficial. Watch cholesterol and blood lipids Have your blood tested for lipids and cholesterol at 81 years of age, then have this test every 5 years. Have your cholesterol levels checked more often if:  Your lipid or cholesterol levels are high.  You are older than 81 years of age.  You are at high risk for heart disease. What should I know about cancer screening? Depending on your health history and family history, you may need to have cancer screening at various ages. This may include screening for:  Breast cancer.  Cervical cancer.  Colorectal cancer.  Skin cancer.  Lung cancer. What should I know about heart disease, diabetes, and high blood  pressure? Blood pressure and heart disease  High blood pressure causes heart disease and increases the risk of stroke. This is more likely to develop in people who have high blood pressure readings, are of African descent, or are overweight.  Have your blood pressure checked: ? Every 3-5 years if you are 18-39 years of age. ? Every year if you are 40 years old or older. Diabetes Have regular diabetes screenings. This checks your fasting blood sugar level. Have the screening done:  Once every three years after age 40 if you are at a normal weight and have a low risk for diabetes.  More often and at a younger age if you are overweight or have a high risk for diabetes. What should I know about preventing infection? Hepatitis B If you have a higher risk for hepatitis B, you should be screened for this virus. Talk with your health care provider to find out if you are at risk for hepatitis B infection. Hepatitis C Testing is recommended for:  Everyone born from 1945 through 1965.  Anyone with known risk factors for hepatitis C. Sexually transmitted infections (STIs)  Get screened for STIs, including gonorrhea and chlamydia, if: ? You are sexually active and are younger than 81 years of age. ? You are older than 81 years of age and your health care provider tells you that you are at risk for this type of infection. ? Your sexual activity has changed since you were last screened, and you are at increased risk for chlamydia or gonorrhea. Ask your health care provider if   you are at risk.  Ask your health care provider about whether you are at high risk for HIV. Your health care provider may recommend a prescription medicine to help prevent HIV infection. If you choose to take medicine to prevent HIV, you should first get tested for HIV. You should then be tested every 3 months for as long as you are taking the medicine. Pregnancy  If you are about to stop having your period (premenopausal) and  you may become pregnant, seek counseling before you get pregnant.  Take 400 to 800 micrograms (mcg) of folic acid every day if you become pregnant.  Ask for birth control (contraception) if you want to prevent pregnancy. Osteoporosis and menopause Osteoporosis is a disease in which the bones lose minerals and strength with aging. This can result in bone fractures. If you are 65 years old or older, or if you are at risk for osteoporosis and fractures, ask your health care provider if you should:  Be screened for bone loss.  Take a calcium or vitamin D supplement to lower your risk of fractures.  Be given hormone replacement therapy (HRT) to treat symptoms of menopause. Follow these instructions at home: Lifestyle  Do not use any products that contain nicotine or tobacco, such as cigarettes, e-cigarettes, and chewing tobacco. If you need help quitting, ask your health care provider.  Do not use street drugs.  Do not share needles.  Ask your health care provider for help if you need support or information about quitting drugs. Alcohol use  Do not drink alcohol if: ? Your health care provider tells you not to drink. ? You are pregnant, may be pregnant, or are planning to become pregnant.  If you drink alcohol: ? Limit how much you use to 0-1 drink a day. ? Limit intake if you are breastfeeding.  Be aware of how much alcohol is in your drink. In the [place] one drink equals one 12 oz bottle of beer (355 mL), one 5 oz glass of wine (148 mL), or one 1 oz glass of hard liquor (44 mL). General instructions  Schedule regular health, dental, and eye exams.  Stay current with your vaccines.  Tell your health care provider if: ? You often feel depressed. ? You have ever been abused or do not feel safe at home. Summary  Adopting a healthy lifestyle and getting preventive care are important in promoting health and wellness.  Follow your health care provider's instructions about healthy  diet, exercising, and getting tested or screened for diseases.  Follow your health care provider's instructions on monitoring your cholesterol and blood pressure. This information is not intended to replace advice given to you by your health care provider. Make sure you discuss any questions you have with your health care provider. Document Revised: 06/03/2018 Document Reviewed: 06/03/2018 Elsevier Patient Education  2020 Elsevier Inc.  

## 2019-10-05 NOTE — Assessment & Plan Note (Signed)
Flu shot up to date. Covid-19 complete. Pneumonia complete. Shingrix counseled. Tetanus due declines due to recent covid-19 vaccine. Colonoscopy aged out. Mammogram aged out, pap smear aged out and dexa due 2022. Counseled about sun safety and mole surveillance. Counseled about the dangers of distracted driving. Given 10 year screening recommendations.

## 2019-10-05 NOTE — Progress Notes (Signed)
   Subjective:   Patient ID: Erica [name], female    DOB: 1940-09-13, 79 y.o.   MRN: [id number]  HPI The patient is an 79 YO female coming in for physical.   PMH, [place], social history reviewed and updated  Review of Systems  Constitutional: Negative.   HENT: Negative.   Eyes: Negative.   Respiratory: Negative for cough, chest tightness and shortness of breath.   Cardiovascular: Negative for chest pain, palpitations and leg swelling.  Gastrointestinal: Negative for abdominal distention, abdominal pain, constipation, diarrhea, nausea and vomiting.  Musculoskeletal: Negative.   Skin: Negative.   Neurological: Negative.   Psychiatric/Behavioral: Negative.     Objective:  Physical Exam Constitutional:      Appearance: She is well-developed.  HENT:     Head: Normocephalic and atraumatic.  Cardiovascular:     Rate and Rhythm: Normal rate and regular rhythm.  Pulmonary:     Effort: Pulmonary effort is normal. No respiratory distress.     Breath sounds: Normal breath sounds. No wheezing or rales.  Abdominal:     General: Bowel sounds are normal. There is no distension.     Palpations: Abdomen is soft.     Tenderness: There is no abdominal tenderness. There is no rebound.  Musculoskeletal:     Cervical back: Normal range of motion.  Skin:    General: Skin is warm and dry.  Neurological:     Mental Status: She is alert and oriented to person, place, and time.     Coordination: Coordination normal.     Vitals:   10/05/19 1021  BP: 126/78  Pulse: 77  Temp: 98.2 F (36.8 C)  TempSrc: Oral  SpO2: 98%  Weight: 149 lb 12.8 oz (67.9 kg)  Height: 5\' 4"  (1.626 m)    This visit occurred during the SARS-CoV-2 public health emergency.  Safety protocols were in place, including screening questions prior to the visit, additional usage of staff PPE, and extensive cleaning of exam room while observing appropriate contact time as indicated for disinfecting solutions.   Assessment &  Plan:

## 2019-10-05 NOTE — Assessment & Plan Note (Signed)
Doing injections at home every 3 weeks and stable.

## 2019-12-05 ENCOUNTER — Other Ambulatory Visit: Payer: Self-pay | Admitting: Internal Medicine

## 2019-12-19 ENCOUNTER — Encounter: Payer: Self-pay | Admitting: Internal Medicine

## 2019-12-20 NOTE — Telephone Encounter (Signed)
Appt made with Dr. Georgina Snell 12/21/2019.

## 2019-12-20 NOTE — Telephone Encounter (Signed)
Left message for patient to call back to schedule.  °

## 2019-12-21 ENCOUNTER — Ambulatory Visit: Payer: Self-pay

## 2019-12-21 ENCOUNTER — Ambulatory Visit: Payer: Medicare PPO | Admitting: Family Medicine

## 2019-12-21 ENCOUNTER — Other Ambulatory Visit: Payer: Self-pay

## 2019-12-21 ENCOUNTER — Encounter: Payer: Self-pay | Admitting: Family Medicine

## 2019-12-21 VITALS — BP 122/72 | HR 68 | Ht 64.0 in | Wt 146.0 lb

## 2019-12-21 DIAGNOSIS — M25552 Pain in left hip: Secondary | ICD-10-CM | POA: Diagnosis not present

## 2019-12-21 DIAGNOSIS — M7062 Trochanteric bursitis, left hip: Secondary | ICD-10-CM

## 2019-12-21 NOTE — Progress Notes (Signed)
    Subjective:    CC: L hip pain  I, Judy Pimple, am serving as a scribe for Dr. Lynne Leader.  HPI: Pt is an 79 y/o female presenting w/ c/o L hip pain x3weeks ago. Was in [place] with family and started hurting after she got back.  She locates her pain to L lower back/buttock. Patient states that her daughter was diagnoses with strep after she left and daughter had joint pain. Patient went dancing Saturday night which seemed to aggravate it.  Radiating pain: yes into her lower back and front pubic area L hip mechanical symptoms: no  Aggravating factors: being on feet all day  Treatments tried: tylenol   Pertinent review of Systems: No fevers or chills. No diffuse myalgia or arthralgia.  Relevant historical information: Diabetes   Objective:    Vitals:   12/21/19 1416  BP: 122/72  Pulse: 68  SpO2: 97%   General: Well Developed, well nourished, and in no acute distress.   MSK: Left hip normal-appearing Normal motion. Tender palpation greater trochanter and SI joint. Hip abduction and external rotation strength diminished 4/5 with pain. Some discomfort with crossover stretch and figure 4 stretch. Normal gait.  L-spine normal-appearing normal motion lower extremity strength intact except noted above.     Impression and Recommendations:    Assessment and Plan: 79 y.o. female with left lateral hip pain predominantly due to trochanteric bursitis. Patient may also have a bit of SI joint dysfunction or lumbosacral strain. Plan for home exercise program and physical therapy. Recheck back in 4 to 6 weeks if not improved. Would consider ultrasound and injection at that time.Marland Kitchen  PDMP not reviewed this encounter. Orders Placed This Encounter  Procedures  . Ambulatory referral to Physical Therapy    Referral Priority:   Routine    Referral Type:   Physical Medicine    Referral Reason:   Specialty Services Required    Requested Specialty:   Physical Therapy    Number  of Visits Requested:   1   No orders of the defined types were placed in this encounter.   Discussed warning signs or symptoms. Please see discharge instructions. Patient expresses understanding.   The above documentation has been reviewed and is accurate and complete Lynne Leader, M.D.

## 2019-12-21 NOTE — Patient Instructions (Addendum)
Thank you for coming in today. I think you have trochanteric bursitis.  Plan for PT.  If not improving we can do an injection.   Recheck if not better in 4-6 weeks.

## 2019-12-24 DIAGNOSIS — M25552 Pain in left hip: Secondary | ICD-10-CM | POA: Diagnosis not present

## 2019-12-24 DIAGNOSIS — M545 Low back pain: Secondary | ICD-10-CM | POA: Diagnosis not present

## 2019-12-24 DIAGNOSIS — M7062 Trochanteric bursitis, left hip: Secondary | ICD-10-CM | POA: Diagnosis not present

## 2019-12-30 DIAGNOSIS — M545 Low back pain: Secondary | ICD-10-CM | POA: Diagnosis not present

## 2019-12-30 DIAGNOSIS — M25552 Pain in left hip: Secondary | ICD-10-CM | POA: Diagnosis not present

## 2019-12-30 DIAGNOSIS — M7062 Trochanteric bursitis, left hip: Secondary | ICD-10-CM | POA: Diagnosis not present

## 2020-01-07 DIAGNOSIS — D2272 Melanocytic nevi of left lower limb, including hip: Secondary | ICD-10-CM | POA: Diagnosis not present

## 2020-01-07 DIAGNOSIS — L821 Other seborrheic keratosis: Secondary | ICD-10-CM | POA: Diagnosis not present

## 2020-01-07 DIAGNOSIS — L820 Inflamed seborrheic keratosis: Secondary | ICD-10-CM | POA: Diagnosis not present

## 2020-01-07 DIAGNOSIS — L661 Lichen planopilaris: Secondary | ICD-10-CM | POA: Diagnosis not present

## 2020-01-07 DIAGNOSIS — L603 Nail dystrophy: Secondary | ICD-10-CM | POA: Diagnosis not present

## 2020-01-07 DIAGNOSIS — D485 Neoplasm of uncertain behavior of skin: Secondary | ICD-10-CM | POA: Diagnosis not present

## 2020-01-07 DIAGNOSIS — L218 Other seborrheic dermatitis: Secondary | ICD-10-CM | POA: Diagnosis not present

## 2020-01-10 DIAGNOSIS — M25552 Pain in left hip: Secondary | ICD-10-CM | POA: Diagnosis not present

## 2020-01-10 DIAGNOSIS — M7062 Trochanteric bursitis, left hip: Secondary | ICD-10-CM | POA: Diagnosis not present

## 2020-01-10 DIAGNOSIS — M545 Low back pain: Secondary | ICD-10-CM | POA: Diagnosis not present

## 2020-01-18 ENCOUNTER — Encounter: Payer: Self-pay | Admitting: Internal Medicine

## 2020-01-18 DIAGNOSIS — M25552 Pain in left hip: Secondary | ICD-10-CM | POA: Diagnosis not present

## 2020-01-18 DIAGNOSIS — M545 Low back pain: Secondary | ICD-10-CM | POA: Diagnosis not present

## 2020-01-18 DIAGNOSIS — M7062 Trochanteric bursitis, left hip: Secondary | ICD-10-CM | POA: Diagnosis not present

## 2020-01-20 ENCOUNTER — Encounter (INDEPENDENT_AMBULATORY_CARE_PROVIDER_SITE_OTHER): Payer: Self-pay | Admitting: Ophthalmology

## 2020-01-20 ENCOUNTER — Other Ambulatory Visit: Payer: Self-pay

## 2020-01-20 ENCOUNTER — Ambulatory Visit (INDEPENDENT_AMBULATORY_CARE_PROVIDER_SITE_OTHER): Payer: Medicare PPO | Admitting: Ophthalmology

## 2020-01-20 DIAGNOSIS — H3554 Dystrophies primarily involving the retinal pigment epithelium: Secondary | ICD-10-CM

## 2020-01-20 DIAGNOSIS — H353133 Nonexudative age-related macular degeneration, bilateral, advanced atrophic without subfoveal involvement: Secondary | ICD-10-CM | POA: Diagnosis not present

## 2020-01-20 DIAGNOSIS — M545 Low back pain: Secondary | ICD-10-CM | POA: Diagnosis not present

## 2020-01-20 DIAGNOSIS — H353134 Nonexudative age-related macular degeneration, bilateral, advanced atrophic with subfoveal involvement: Secondary | ICD-10-CM | POA: Diagnosis not present

## 2020-01-20 DIAGNOSIS — H353132 Nonexudative age-related macular degeneration, bilateral, intermediate dry stage: Secondary | ICD-10-CM | POA: Insufficient documentation

## 2020-01-20 DIAGNOSIS — M25552 Pain in left hip: Secondary | ICD-10-CM | POA: Diagnosis not present

## 2020-01-20 DIAGNOSIS — H43821 Vitreomacular adhesion, right eye: Secondary | ICD-10-CM | POA: Diagnosis not present

## 2020-01-20 DIAGNOSIS — M7062 Trochanteric bursitis, left hip: Secondary | ICD-10-CM | POA: Diagnosis not present

## 2020-01-20 NOTE — Progress Notes (Signed)
01/20/2020     CHIEF COMPLAINT Patient presents for Retina Follow Up   HISTORY OF PRESENT ILLNESS: Erica [name] is a 79 y.o. female who presents to the clinic today for:   HPI    Retina Follow Up    Patient presents with  Dry AMD.  In both eyes.  This started 4 months ago.  Severity is mild.  Duration of 4 months.  Since onset it is stable.          Comments    4 Month AMD F/U OU  Pt denies noticeable changes to [place] OU since last visit. Pt denies ocular pain, flashes of light, or floaters OU.         Last edited by Rockie Neighbours, [place] on 01/20/2020  1:09 PM. (History)      Referring physician: Hoyt Koch, MD [place],  [place] [address]  HISTORICAL INFORMATION:   Selected notes from the MEDICAL RECORD NUMBER    Lab Results  Component Value Date   HGBA1C 6.3 10/05/2019     CURRENT MEDICATIONS: No current outpatient medications on file. (Ophthalmic Drugs)   No current facility-administered medications for this visit. (Ophthalmic Drugs)   Current Outpatient Medications (Other)  Medication Sig  . aspirin 81 MG tablet Take 81 mg by mouth daily.  . cetirizine (ZYRTEC) 10 MG tablet Take 10 mg by mouth daily.  . clobetasol ointment (TEMOVATE) 0.05 % Use thin film twice daily for 2 weeks, then daily for 2 months, then 3 times per week for 2 months, then once a week for 2 months then stop. (Patient not taking: Reported on 10/05/2019)  . cyanocobalamin (,VITAMIN B-12,) 1000 MCG/ML injection INJECT 1 ML (1,000 MCG TOTAL) INTO THE MUSCLE EVERY 21 ( TWENTY-ONE) DAYS.  Marland Kitchen loperamide (IMODIUM) 2 MG capsule Take 2 mg by mouth as needed for diarrhea or loose stools. Reported on 07/26/2015  . Multiple Vitamins-Minerals (PRESERVISION AREDS PO) Take by mouth. Reported on 11/16/2015  . SYRINGE-NEEDLE, DISP, 3 ML (BD SAFETYGLIDE SYRINGE/NEEDLE) 25G X 1" 3 ML MISC Used to inject B12   No current facility-administered medications for this visit. (Other)       REVIEW OF SYSTEMS:    ALLERGIES Allergies  Allergen Reactions  . Scallops [Shellfish Allergy] Nausea And Vomiting    Scallops only  . Statins Other (See Comments)    Phobia of statins    PAST MEDICAL HISTORY Past Medical History:  Diagnosis Date  . Allergic rhinitis   . Arthritis   . Asthma   . Diet-controlled type 2 diabetes mellitus ([place]) 09/2011 dx  . Diverticulosis   . Dyslipidemia   . Macular degeneration    genetic etiology, not age-related per retinal specailist  . Recurrent kidney stones    Past Surgical History:  Procedure Laterality Date  . CATARACT EXTRACTION Bilateral 01/2015   L on 8/8, R on 8/22  . DILATION AND CURETTAGE OF UTERUS    . TONSILLECTOMY AND ADENOIDECTOMY      FAMILY HISTORY Family History  Problem Relation Age of Onset  . Heart disease Father   . Hyperlipidemia Father   . Hypertension Father   . Colon cancer Mother   . Arthritis Mother   . Cancer Mother        colon cancer  . Diabetes Brother   . Hyperlipidemia Son   . Diabetes Paternal Aunt     SOCIAL HISTORY Social History   Tobacco Use  . Smoking status: Never  Smoker  . Smokeless tobacco: Never Used  . 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[place] but in [place] since [place] Use  . Vaping Use: Never used  Substance Use Topics  . Alcohol use: Yes    Alcohol/week: 3.0 standard drinks    Types: 3 Glasses of wine per week    Comment: can't drink red wine anymore; on glass of white wine rarely  . Drug use: No         OPHTHALMIC EXAM:  Base Eye Exam    Visual Acuity (ETDRS)      Right Left   Dist cc 20/100 -2 20/50   Dist ph cc 20/100 -1 NI   Correction: Glasses       Tonometry (Tonopen, 1:14 PM)      Right Left   Pressure 16 17       Pupils      Pupils Dark Light Shape React APD   Right PERRL 4 3 Round Brisk None   Left PERRL 4 3 Round Brisk None       Visual Fields (Counting fingers)       Left Right    Full Full       Extraocular Movement      Right Left    Full Full       Neuro/Psych    Oriented x3: Yes   Mood/Affect: Normal       Dilation    Both eyes: 1.0% Mydriacyl, 2.5% Phenylephrine @ 1:14 PM        Slit Lamp and Fundus Exam    External Exam      Right Left   External Normal Normal       Slit Lamp Exam      Right Left   Lids/Lashes Normal Normal   Conjunctiva/Sclera White and quiet White and quiet   Cornea Clear Clear   Anterior Chamber Deep and quiet Deep and quiet   Iris Round and reactive Round and reactive   Lens Posterior chamber intraocular lens, Centered posterior chamber intraocular lens Posterior chamber intraocular lens, Centered posterior chamber intraocular lens   Anterior Vitreous Normal Normal       Fundus Exam      Right Left   Posterior Vitreous Posterior vitreous detachment Posterior vitreous detachment   Disc Normal Normal   C/D Ratio 0.4 0.4   Macula Retinal pigment epithelial mottling, Retinal pigment epithelial atrophy Retinal pigment epithelial mottling, Retinal pigment epithelial atrophy   Vessels Normal Normal   Periphery Normal Normal          IMAGING AND PROCEDURES  Imaging and Procedures for 01/20/20  OCT, Retina - OU - Both Eyes       Right Eye Quality was good. Scan locations included subfoveal. Central Foveal Thickness: 218. Progression has been stable. Findings include abnormal foveal contour, central retinal atrophy, outer retinal atrophy, preretinal fibrosis, subretinal hyper-reflective material.   Left Eye Quality was good. Scan locations included subfoveal. Central Foveal Thickness: 203. Progression has been stable. Findings include central retinal atrophy, outer retinal atrophy, preretinal fibrosis, abnormal foveal contour, subretinal hyper-reflective material.   Notes Large subfoveal drusenoid pigment epithelial detachments OU pronounced in prior years as recent as 2000 1718, have now collapsed  into atrophic outer retinal processes with choriocapillaris dropout, RPE scarring, Bruch's membrane thickening.  Pigmentary degeneration is also noted.                ASSESSMENT/PLAN:  No problem-specific Assessment & Plan notes found for this encounter.      ICD-10-CM   1. Advanced nonexudative age-related macular degeneration of both eyes without subfoveal involvement  H35.3133 OCT, Retina - OU - Both Eyes  2. Dystrophies primarily involving the retinal pigment epithelium  H35.54 OCT, Retina - OU - Both Eyes  3. Vitreomacular adhesion of right eye  H43.821   4. Advanced nonexudative age-related macular degeneration of both eyes with subfoveal involvement  H35.3134     1.  2.  3.  Ophthalmic Meds Ordered this visit:  No orders of the defined types were placed in this encounter.      Return in about 6 months (around 07/22/2020) for DILATE OU, OCT.  There are no Patient Instructions on file for this visit.   Explained the diagnoses, plan, and follow up with the patient and they expressed understanding.  Patient expressed understanding of the importance of proper follow up care.   Clent Demark Samaiyah Howes M.D. Diseases & Surgery of the Retina and Vitreous Retina & Diabetic [place] 01/20/20     Abbreviations: M myopia (nearsighted); A astigmatism; H hyperopia (farsighted); P presbyopia; Mrx spectacle prescription;  CTL contact lenses; OD right eye; OS left eye; OU both eyes  XT exotropia; ET esotropia; PEK punctate epithelial keratitis; PEE punctate epithelial erosions; DES dry eye syndrome; MGD meibomian gland dysfunction; ATs artificial tears; PFAT's preservative free artificial tears; [place] nuclear sclerotic cataract; PSC posterior subcapsular cataract; ERM epi-retinal membrane; PVD posterior vitreous detachment; RD retinal detachment; DM diabetes mellitus; DR diabetic retinopathy; NPDR non-proliferative diabetic retinopathy; PDR proliferative diabetic retinopathy; CSME  clinically significant macular edema; DME diabetic macular edema; dbh dot blot hemorrhages; CWS cotton wool spot; POAG primary open angle glaucoma; C/D cup-to-disc ratio; HVF humphrey visual field; GVF goldmann visual field; OCT optical coherence tomography; IOP intraocular pressure; BRVO Branch retinal vein occlusion; CRVO central retinal vein occlusion; CRAO central retinal artery occlusion; BRAO branch retinal artery occlusion; RT retinal tear; SB scleral buckle; PPV pars plana vitrectomy; VH Vitreous hemorrhage; PRP panretinal laser photocoagulation; IVK intravitreal kenalog; VMT vitreomacular traction; MH Macular hole;  NVD neovascularization of the disc; NVE neovascularization elsewhere; AREDS age related eye disease study; ARMD age related macular degeneration; POAG primary open angle glaucoma; EBMD epithelial/anterior basement membrane dystrophy; ACIOL anterior chamber intraocular lens; IOL intraocular lens; PCIOL posterior chamber intraocular lens; Phaco/IOL phacoemulsification with intraocular lens placement; [place] photorefractive keratectomy; LASIK laser assisted in situ keratomileusis; HTN hypertension; DM diabetes mellitus; COPD chronic obstructive pulmonary disease

## 2020-01-25 DIAGNOSIS — M25552 Pain in left hip: Secondary | ICD-10-CM | POA: Diagnosis not present

## 2020-01-25 DIAGNOSIS — M7062 Trochanteric bursitis, left hip: Secondary | ICD-10-CM | POA: Diagnosis not present

## 2020-01-25 DIAGNOSIS — M545 Low back pain: Secondary | ICD-10-CM | POA: Diagnosis not present

## 2020-01-28 DIAGNOSIS — M545 Low back pain: Secondary | ICD-10-CM | POA: Diagnosis not present

## 2020-01-28 DIAGNOSIS — M25552 Pain in left hip: Secondary | ICD-10-CM | POA: Diagnosis not present

## 2020-01-28 DIAGNOSIS — M7062 Trochanteric bursitis, left hip: Secondary | ICD-10-CM | POA: Diagnosis not present

## 2020-02-18 ENCOUNTER — Ambulatory Visit (INDEPENDENT_AMBULATORY_CARE_PROVIDER_SITE_OTHER): Payer: Medicare PPO

## 2020-02-18 DIAGNOSIS — Z0000 Encounter for general adult medical examination without abnormal findings: Secondary | ICD-10-CM | POA: Diagnosis not present

## 2020-02-18 NOTE — Progress Notes (Signed)
I connected with Erica [name] today by telephone and verified that I am speaking with the correct person using two identifiers. Location patient: home Location provider: work Persons participating in the virtual visit: Librarian, academic and [employer], LPN.   I discussed the limitations, risks, security and privacy concerns of performing an evaluation and management service by telephone and the availability of in person appointments. I also discussed with the patient that there may be a patient responsible charge related to this service. The patient expressed understanding and verbally consented to this telephonic visit.    Interactive audio and video telecommunications were attempted between this provider and patient, however failed, due to patient having technical difficulties OR patient did not have access to video capability.  We continued and completed visit with audio only.  Some vital signs may be absent or patient reported.   Time Spent with patient on telephone encounter: 20 minutes  Subjective:   Erica [name] is a 79 y.o. female who presents for Medicare Annual (Subsequent) preventive examination.  Review of Systems    No ROS. Medicare Wellness Visit. Cardiac Risk Factors include: advanced age (>73men, >17 women);dyslipidemia;family history of premature cardiovascular disease     Objective:    There were no vitals filed for this visit. There is no height or weight on file to calculate BMI.  Advanced Directives 02/18/2020 02/12/2019 01/08/2018  Does Patient Have a Medical Advance Directive? Yes Yes Yes  Type of Advance Directive Living will;Healthcare Power of [place];Living will [place];Living will  Copy of [place] in Chart? No - copy requested No - copy requested No - copy requested    Current Medications (verified) Outpatient Encounter Medications as of 02/18/2020  Medication Sig  . aspirin 81  MG tablet Take 81 mg by mouth daily.  . cetirizine (ZYRTEC) 10 MG tablet Take 10 mg by mouth daily.  . clobetasol ointment (TEMOVATE) 0.05 % Use thin film twice daily for 2 weeks, then daily for 2 months, then 3 times per week for 2 months, then once a week for 2 months then stop.  . cyanocobalamin (,VITAMIN B-12,) 1000 MCG/ML injection INJECT 1 ML (1,000 MCG TOTAL) INTO THE MUSCLE EVERY 21 ( TWENTY-ONE) DAYS.  Marland Kitchen loperamide (IMODIUM) 2 MG capsule Take 2 mg by mouth as needed for diarrhea or loose stools. Reported on 07/26/2015  . Multiple Vitamins-Minerals (PRESERVISION AREDS PO) Take by mouth. Reported on 11/16/2015  . SYRINGE-NEEDLE, DISP, 3 ML (BD SAFETYGLIDE SYRINGE/NEEDLE) 25G X 1" 3 ML MISC Used to inject B12   No facility-administered encounter medications on file as of 02/18/2020.    Allergies (verified) Scallops [shellfish allergy] and Statins   History: Past Medical History:  Diagnosis Date  . Allergic rhinitis   . Arthritis   . Asthma   . Diet-controlled type 2 diabetes mellitus ([place]) 09/2011 dx  . Diverticulosis   . Dyslipidemia   . Macular degeneration    genetic etiology, not age-related per retinal specailist  . Recurrent kidney stones    Past Surgical History:  Procedure Laterality Date  . CATARACT EXTRACTION Bilateral 01/2015   L on 8/8, R on 8/22  . DILATION AND CURETTAGE OF UTERUS    . TONSILLECTOMY AND ADENOIDECTOMY     Family History  Problem Relation Age of Onset  . Heart disease Father   . Hyperlipidemia Father   . Hypertension Father   . Colon cancer Mother   . Arthritis Mother   .  Cancer Mother        colon cancer  . Diabetes Brother   . Hyperlipidemia Son   . Diabetes Paternal Aunt    Social History   Socioeconomic History  . Marital status: Married    Spouse name: Not on file  . Number of children: 2  . Years of education: Not on file  . Highest education level: Not on file  Occupational History  . Occupation: Retired  Tobacco Use  .  Smoking status: Never Smoker  . Smokeless tobacco: Never Used  . 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 Vaping Use: Never used  Substance and Sexual Activity  . Alcohol use: Yes    Alcohol/week: 3.0 standard drinks    Types: 3 Glasses of wine per week    Comment: can't drink red wine anymore; on glass of white wine rarely  . Drug use: No  . Sexual activity: Yes  Other Topics Concern  . Not on file  Social History Narrative   Regular exercise-yes   Caffeine Use-yes               Epworth Sleepiness Scale = 5 (as of 07/04/2015)   Social Determinants of Health   Financial Resource Strain: Low Risk   . Difficulty of Paying Living Expenses: Not hard at all  Food Insecurity: No Food Insecurity  . Worried About Charity fundraiser in the Last Year: Never true  . Ran Out of Food in the Last Year: Never true  Transportation Needs: No Transportation Needs  . Lack of Transportation (Medical): No  . Lack of Transportation (Non-Medical): No  Physical Activity: Sufficiently Active  . Days of Exercise per Week: 5 days  . Minutes of Exercise per Session: 30 min  Stress: No Stress Concern Present  . Feeling of Stress : Not at all  Social Connections:   . Frequency of Communication with Friends and Family: Not on file  . Frequency of Social Gatherings with Friends and Family: Not on file  . Attends Religious Services: Not on file  . Active Member of Clubs or Organizations: Not on file  . Attends Archivist Meetings: Not on file  . 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 bring  in CBG monitor from home?: No  How often do you need to have someone help you when you read instructions, pamphlets, or other written materials from your doctor or pharmacy?: 1 - Never What is the last grade level you completed in school?: PH.D  Diabetic? no  Interpreter Needed?: No  Information entered by :: Karanvir Balderston N. Jarry Manon, LPN   Activities of Daily Living In your present state of health, do you have any difficulty performing the following activities: 02/18/2020  Hearing? Y  Comment left hearing aid  Vision? N  Difficulty concentrating or making decisions? N  Walking or climbing stairs? N  Dressing or bathing? N  Doing errands, shopping? N  Preparing Food and eating ? N  Using the Toilet? N  In the past six months, have you accidently leaked urine? N  Do you have  problems with loss of bowel control? N  Managing your Medications? N  Managing your Finances? N  Housekeeping or managing your Housekeeping? N  Some recent data might be hidden    Patient Care Team: Hoyt Koch, MD as PCP - General (Internal Medicine) Clent Jacks, MD (Ophthalmology)  Indicate any recent Medical Services you may have received from other than [facility] providers in the past year (date may be approximate).     Assessment:   This is a routine wellness examination for Erica [name].  Hearing/Vision screen No exam data present  Dietary issues and exercise activities discussed: Current Exercise Habits: Home exercise routine, Type of exercise: walking;Other - see comments (dancing, swim everyday), Time (Minutes): 30, Frequency (Times/Week): 5, Weekly Exercise (Minutes/Week): 150, Intensity: Moderate, Exercise limited by: None identified  Goals    .  Exercise 150 minutes per week (moderate activity)      Will exercise more    .  Patient Stated      Stay as healthy and as independent as possible. Enjoy life, family and friends.    .  Patient Stated    .  Patient Stated (pt-stated)      My  goal would be to lose at least 10 pounds.      Depression Screen PHQ 2/9 Scores 02/18/2020 10/05/2019 02/12/2019 01/08/2018 11/27/2016 09/26/2015 09/01/2014  PHQ - 2 Score 0 0 0 0 0 3 0  PHQ- 9 Score - - - 3 5 - -    Fall Risk Fall Risk  02/18/2020 10/05/2019 02/12/2019 01/08/2018 03/19/2017  Falls in the past year? 0 0 0 No No  Number falls in past yr: 0 0 0 - -  Injury with Fall? 0 0 - - -  Risk for fall due to : No Fall Risks - - - -  Follow up Falls evaluation completed - - - -    Any stairs in or around the home? Yes  If so, are there any without handrails? No  Home free of loose throw rugs in walkways, pet beds, electrical cords, etc? Yes  Adequate lighting in your home to reduce risk of falls? Yes   ASSISTIVE DEVICES UTILIZED TO PREVENT FALLS:  Life alert? No  Use of a cane, walker or w/c? No  Grab bars in the bathroom? Yes  Shower chair or bench in shower? No  Elevated toilet seat or a handicapped toilet? No   TIMED UP AND GO:  Was the test performed? No .  Length of time to ambulate 10 feet: 0 sec.   Gait steady and fast without use of assistive device  Cognitive Function: not indicated; patient is cogitatively intact.        Immunizations Immunization History  Administered Date(s) Administered  . Fluad Quad(high Dose 65+) 04/08/2019  . Hepatitis A, Adult 02/24/2013  . Influenza Split 03/08/2014  . Influenza, High Dose Seasonal PF 04/18/2015, 03/19/2017  . Influenza,inj,Quad PF,6+ Mos 02/24/2013  . Influenza-Unspecified 04/03/2016, 04/02/2018  . PFIZER SARS-COV-2 Vaccination 07/29/2019, 08/24/2019  . Pneumococcal Conjugate-13 05/31/2013  . Pneumococcal-Unspecified 09/22/2008  . Td 06/24/2009  . Zoster 09/09/2007  . Zoster Recombinat (Shingrix) 12/04/2016    TDAP status: Up to date Flu Vaccine status: Up to date Pneumococcal vaccine status: Up to date Covid-19 vaccine status: Completed vaccines  Qualifies for Shingles Vaccine? Yes   Zostavax completed Yes     Shingrix Completed?: Yes  Screening Tests Health Maintenance  Topic Date Due  . OPHTHALMOLOGY EXAM  12/31/2017  . TETANUS/TDAP  06/25/2019  . INFLUENZA VACCINE  01/23/2020  . HEMOGLOBIN A1C  04/05/2020  . FOOT EXAM  10/04/2020  . URINE MICROALBUMIN  10/04/2020  . DEXA SCAN  Completed  . COVID-19 Vaccine  Completed  . PNA vac Low Risk Adult  Completed    Health Maintenance  Health Maintenance Due  Topic Date Due  . OPHTHALMOLOGY EXAM  12/31/2017  . TETANUS/TDAP  06/25/2019  . INFLUENZA VACCINE  01/23/2020    Colorectal cancer screening: Completed 09/25/2017. Repeat every 3 years Mammogram status: No longer required.  Bone Density status: Completed 11/27/2011. Results reflect: Bone density results: OSTEOPENIA. Repeat every 2-3 years.  Lung Cancer Screening: (Low Dose CT Chest recommended if Age 68-80 years, 30 pack-year currently smoking OR have quit w/in 15years.) does not qualify.   Lung Cancer Screening Referral: no  Additional Screening:  Hepatitis C Screening: does not qualify; Completed no  Vision Screening: Recommended annual ophthalmology exams for early detection of glaucoma and other disorders of the eye. Is the patient up to date with their annual eye exam?  Yes  Who is the provider or what is the name of the office in which the patient attends annual eye exams? Clent Jacks, MD and Deloria Lair, MD (retinal disorders) If pt is not established with a provider, would they like to be referred to a provider to establish care? No .   Dental Screening: Recommended annual dental exams for proper oral hygiene  Community Resource Referral / Chronic Care Management: CRR required this visit?  No   CCM required this visit?  No      Plan:     I have personally reviewed and noted the following in the patient's chart:   . Medical and social history . Use of alcohol, tobacco or illicit drugs  . Current medications and supplements . Functional ability and  status . Nutritional status . Physical activity . Advanced directives . List of other physicians . Hospitalizations, surgeries, and ER visits in previous 12 months . Vitals . Screenings to include cognitive, depression, and falls . Referrals and appointments  In addition, I have reviewed and discussed with patient certain preventive protocols, quality metrics, and best practice recommendations. A written personalized care plan for preventive services as well as general preventive health recommendations were provided to patient.     Sheral Flow, LPN   11/28/3014   Nurse Notes:  Patient is cogitatively intact. There were no vitals filed for this visit. There is no height or weight on file to calculate BMI. Patient stated that she has no issues with gait or balance; does not use any assistive devices. Patient will speak with primary care at next visit regarding Cologuard.

## 2020-02-18 NOTE — Patient Instructions (Addendum)
Erica [name] , Thank you for taking time to come for your Medicare Wellness Visit. I appreciate your ongoing commitment to your health goals. Please review the following plan we discussed and let me know if I can assist you in the future.   Screening recommendations/referrals: Colonoscopy: 09/25/2017; due every 3 years (Cologuard) Mammogram: 02/20/2018 Bone Density: 11/27/2011 Recommended yearly ophthalmology/optometry visit for glaucoma screening and checkup Recommended yearly dental visit for hygiene and checkup  Vaccinations: Influenza vaccine: 04/08/2019 Pneumococcal vaccine: completed Tdap vaccine: overdue Shingles vaccine: completed   Covid-19: completed  Advanced directives: Please bring a copy of your health care power of attorney and living will to the office at your convenience.  Conditions/risks identified: Yes. Reviewed health maintenance screenings with patient today and relevant education, vaccines, and/or referrals were provided. Continue doing brain stimulating activities (puzzles, reading, adult coloring books, staying active) to keep memory sharp. Continue to eat heart healthy diet (full of fruits, vegetables, whole grains, lean protein, water--limit salt, fat, and sugar intake) and increase physical activity as tolerated.  Next appointment:  Please schedule your next Medicare Wellness Visit with your Nurse Health Advisor in 1 year.   Preventive Care 21 Years and Older, Female Preventive care refers to lifestyle choices and visits with your health care provider that can promote health and wellness. What does preventive care include?  A yearly physical exam. This is also called an annual well check.  Dental exams once or twice a year.  Routine eye exams. Ask your health care provider how often you should have your eyes checked.  Personal lifestyle choices, including:  Daily care of your teeth and gums.  Regular physical activity.  Eating a healthy diet.  Avoiding  tobacco and drug use.  Limiting alcohol use.  Practicing safe sex.  Taking low-dose aspirin every day.  Taking vitamin and mineral supplements as recommended by your health care provider. What happens during an annual well check? The services and screenings done by your health care provider during your annual well check will depend on your age, overall health, lifestyle risk factors, and family history of disease. Counseling  Your health care provider may ask you questions about your:  Alcohol use.  Tobacco use.  Drug use.  Emotional well-being.  Home and relationship well-being.  Sexual activity.  Eating habits.  History of falls.  Memory and ability to understand (cognition).  Work and work Statistician.  Reproductive health. Screening  You may have the following tests or measurements:  Height, weight, and BMI.  Blood pressure.  Lipid and cholesterol levels. These may be checked every 5 years, or more frequently if you are over 39 years old.  Skin check.  Lung cancer screening. You may have this screening every year starting at age 1 if you have a 30-pack-year history of smoking and currently smoke or have quit within the past 15 years.  Fecal occult blood test (FOBT) of the stool. You may have this test every year starting at age 43.  Flexible sigmoidoscopy or colonoscopy. You may have a sigmoidoscopy every 5 years or a colonoscopy every 10 years starting at age 66.  Hepatitis C blood test.  Hepatitis B blood test.  Sexually transmitted disease (STD) testing.  Diabetes screening. This is done by checking your blood sugar (glucose) after you have not eaten for a while (fasting). You may have this done every 1-3 years.  Bone density scan. This is done to screen for osteoporosis. You may have this done starting at age 71.  Mammogram. This may be done every 1-2 years. Talk to your health care provider about how often you should have regular  mammograms. Talk with your health care provider about your test results, treatment options, and if necessary, the need for more tests. Vaccines  Your health care provider may recommend certain vaccines, such as:  Influenza vaccine. This is recommended every year.  Tetanus, diphtheria, and acellular pertussis (Tdap, Td) vaccine. You may need a Td booster every 10 years.  Zoster vaccine. You may need this after age 25.  Pneumococcal 13-valent conjugate (PCV13) vaccine. One dose is recommended after age 14.  Pneumococcal polysaccharide (PPSV23) vaccine. One dose is recommended after age 11. Talk to your health care provider about which screenings and vaccines you need and how often you need them. This information is not intended to replace advice given to you by your health care provider. Make sure you discuss any questions you have with your health care provider. Document Released: 07/07/2015 Document Revised: 02/28/2016 Document Reviewed: 04/11/2015 Elsevier Interactive Patient Education  2017 [place] Prevention in the Home Falls can cause injuries. They can happen to people of all ages. There are many things you can do to make your home safe and to help prevent falls. What can I do on the outside of my home?  Regularly fix the edges of walkways and driveways and fix any cracks.  Remove anything that might make you trip as you walk through a door, such as a raised step or threshold.  Trim any bushes or trees on the path to your home.  Use bright outdoor lighting.  Clear any walking paths of anything that might make someone trip, such as rocks or tools.  Regularly check to see if handrails are loose or broken. Make sure that both sides of any steps have handrails.  Any raised decks and porches should have guardrails on the edges.  Have any leaves, snow, or ice cleared regularly.  Use sand or salt on walking paths during winter.  Clean up any spills in your garage  right away. This includes oil or grease spills. What can I do in the bathroom?  Use night lights.  Install grab bars by the toilet and in the tub and shower. Do not use towel bars as grab bars.  Use non-skid mats or decals in the tub or shower.  If you need to sit down in the shower, use a plastic, non-slip stool.  Keep the floor dry. Clean up any water that spills on the floor as soon as it happens.  Remove soap buildup in the tub or shower regularly.  Attach bath mats securely with double-sided non-slip rug tape.  Do not have throw rugs and other things on the floor that can make you trip. What can I do in the bedroom?  Use night lights.  Make sure that you have a light by your bed that is easy to reach.  Do not use any sheets or blankets that are too big for your bed. They should not hang down onto the floor.  Have a firm chair that has side arms. You can use this for support while you get dressed.  Do not have throw rugs and other things on the floor that can make you trip. What can I do in the kitchen?  Clean up any spills right away.  Avoid walking on wet floors.  Keep items that you use a lot in easy-to-reach places.  If you need to reach  something above you, use a strong step stool that has a grab bar.  Keep electrical cords out of the way.  Do not use floor polish or wax that makes floors slippery. If you must use wax, use non-skid floor wax.  Do not have throw rugs and other things on the floor that can make you trip. What can I do with my stairs?  Do not leave any items on the stairs.  Make sure that there are handrails on both sides of the stairs and use them. Fix handrails that are broken or loose. Make sure that handrails are as long as the stairways.  Check any carpeting to make sure that it is firmly attached to the stairs. Fix any carpet that is loose or worn.  Avoid having throw rugs at the top or bottom of the stairs. If you do have throw rugs,  attach them to the floor with carpet tape.  Make sure that you have a light switch at the top of the stairs and the bottom of the stairs. If you do not have them, ask someone to add them for you. What else can I do to help prevent falls?  Wear shoes that:  Do not have high heels.  Have rubber bottoms.  Are comfortable and fit you well.  Are closed at the toe. Do not wear sandals.  If you use a stepladder:  Make sure that it is fully opened. Do not climb a closed stepladder.  Make sure that both sides of the stepladder are locked into place.  Ask someone to hold it for you, if possible.  Clearly mark and make sure that you can see:  Any grab bars or handrails.  First and last steps.  Where the edge of each step is.  Use tools that help you move around (mobility aids) if they are needed. These include:  Canes.  Walkers.  Scooters.  Crutches.  Turn on the lights when you go into a dark area. Replace any light bulbs as soon as they burn out.  Set up your furniture so you have a clear path. Avoid moving your furniture around.  If any of your floors are uneven, fix them.  If there are any pets around you, be aware of where they are.  Review your medicines with your doctor. Some medicines can make you feel dizzy. This can increase your chance of falling. Ask your doctor what other things that you can do to help prevent falls. This information is not intended to replace advice given to you by your health care provider. Make sure you discuss any questions you have with your health care provider. Document Released: 04/06/2009 Document Revised: 11/16/2015 Document Reviewed: 07/15/2014 Elsevier Interactive Patient Education  2017 [employer].

## 2020-04-24 ENCOUNTER — Encounter: Payer: Self-pay | Admitting: Internal Medicine

## 2020-04-24 ENCOUNTER — Telehealth: Payer: Self-pay

## 2020-04-24 NOTE — Telephone Encounter (Signed)
Informed Mindi Slicker, FNP re: pt myChart message.  "Hi Dr. Sharlet Salina, I've been having some mild chest pains and shortness of breath when working out in my garden, which goes away when I sit down and rest. I wonder if I can come in and get an EKG just to be sure it isn't anythinng serious." Orders rec'd to instruct pt to go to ER.  Will also send this via myChart since pt sent original message there.

## 2020-04-25 ENCOUNTER — Encounter: Payer: Self-pay | Admitting: Internal Medicine

## 2020-05-11 ENCOUNTER — Ambulatory Visit: Payer: Medicare PPO | Admitting: Internal Medicine

## 2020-05-16 ENCOUNTER — Encounter: Payer: Self-pay | Admitting: Internal Medicine

## 2020-05-16 ENCOUNTER — Other Ambulatory Visit: Payer: Self-pay

## 2020-05-16 ENCOUNTER — Ambulatory Visit: Payer: Medicare PPO | Admitting: Internal Medicine

## 2020-05-16 VITALS — BP 128/68 | HR 71 | Temp 98.2°F | Wt 143.0 lb

## 2020-05-16 DIAGNOSIS — R1013 Epigastric pain: Secondary | ICD-10-CM | POA: Diagnosis not present

## 2020-05-16 DIAGNOSIS — R0789 Other chest pain: Secondary | ICD-10-CM | POA: Diagnosis not present

## 2020-05-16 LAB — COMPREHENSIVE METABOLIC PANEL
ALT: 16 U/L (ref 0–35)
AST: 15 U/L (ref 0–37)
Albumin: 4.2 g/dL (ref 3.5–5.2)
Alkaline Phosphatase: 64 U/L (ref 39–117)
BUN: 19 mg/dL (ref 6–23)
CO2: 31 mEq/L (ref 19–32)
Calcium: 9.6 mg/dL (ref 8.4–10.5)
Chloride: 103 mEq/L (ref 96–112)
Creatinine, Ser: 0.83 mg/dL (ref 0.40–1.20)
GFR: 65.95 mL/min (ref >60.00)
Glucose, Bld: 97 mg/dL (ref 70–99)
Potassium: 4.5 mEq/L (ref 3.5–5.1)
Sodium: 138 mEq/L (ref 135–145)
Total Bilirubin: 0.4 mg/dL (ref 0.2–1.2)
Total Protein: 6.9 g/dL (ref 6.0–8.3)

## 2020-05-16 LAB — CBC
HCT: 44.6 % (ref 36.0–46.0)
Hemoglobin: 15 g/dL (ref 12.0–15.0)
MCHC: 33.6 g/dL (ref 30.0–36.0)
MCV: 90.1 fl (ref 78.0–100.0)
Platelets: 274 10*3/uL (ref 150.0–400.0)
RBC: 4.94 Mil/uL (ref 3.87–5.11)
RDW: 13.5 % (ref 11.5–15.5)
WBC: 8.9 10*3/uL (ref 4.0–10.5)

## 2020-05-16 LAB — LIPASE: Lipase: 18 U/L (ref 11.0–59.0)

## 2020-05-16 MED ORDER — AMOXICILLIN-POT CLAVULANATE 875-125 MG PO TABS
1.0000 | ORAL_TABLET | Freq: Two times a day (BID) | ORAL | 0 refills | Status: DC
Start: 2020-05-16 — End: 2020-05-22

## 2020-05-16 NOTE — Progress Notes (Signed)
   Subjective:   Patient ID: Erica [name], female    DOB: Oct 28, 1940, 79 y.o.   MRN: [id number]  HPI The patient is an 79 YO female coming in for concerns about stomach pain which started yesterday. Pain is upper left quadrant and in the middle. This pain radiates into the back. Accompanied by nausea and 3 episodes of diarrhea yesterday. Denies fevers or chills. Has previous vaccination for covid-19. Overall slightly better today but did not eat much yesterday. This morning ate egg and toast and feels that this was a mistake as she is hurting again but not as severe as yesterday. Pain severe for hours and then persistent but milder rest of day. Also was previously having some chest pain episodes. 1 with gardening, 1 with rest. Pain lasted about 10-15 minutes. She denies associated symptoms.   Review of Systems  Constitutional: Positive for appetite change.  HENT: Negative.   Eyes: Negative.   Respiratory: Negative for cough, chest tightness and shortness of breath.   Cardiovascular: Positive for chest pain. Negative for palpitations and leg swelling.  Gastrointestinal: Positive for abdominal distention, abdominal pain, diarrhea and nausea. Negative for anal bleeding, blood in stool, constipation, rectal pain and vomiting.  Musculoskeletal: Negative.   Skin: Negative.   Neurological: Negative.   Psychiatric/Behavioral: Negative.     Objective:  Physical Exam Constitutional:      Appearance: She is well-developed.  HENT:     Head: Normocephalic and atraumatic.  Cardiovascular:     Rate and Rhythm: Normal rate and regular rhythm.  Pulmonary:     Effort: Pulmonary effort is normal. No respiratory distress.     Breath sounds: Normal breath sounds. No wheezing or rales.  Abdominal:     General: Bowel sounds are normal. There is no distension.     Palpations: Abdomen is soft.     Tenderness: There is abdominal tenderness. There is no rebound.     Comments: Pain midline upper and left  sided to palpation. No rebound or guarding. No pain to palpation of lower abdomen.   Musculoskeletal:     Cervical back: Normal range of motion.  Skin:    General: Skin is warm and dry.  Neurological:     Mental Status: She is alert and oriented to person, place, and time.     Coordination: Coordination normal.     Vitals:   05/16/20 1103  BP: 128/68  Pulse: 71  Temp: 98.2 F (36.8 C)  TempSrc: Oral  SpO2: 96%  Weight: 143 lb (64.9 kg)   EKG: Rate 67, axis normal, intervals normal, sinus, no st or t wave changes, no significant change from prior 2020  This visit occurred during the SARS-CoV-2 public health emergency.  Safety protocols were in place, including screening questions prior to the visit, additional usage of staff PPE, and extensive cleaning of exam room while observing appropriate contact time as indicated for disinfecting solutions.   Assessment & Plan:

## 2020-05-16 NOTE — Assessment & Plan Note (Signed)
EKG done in office without changes. Will assess and treat abdominal concerns which are more acute. Then may need stress testing given this is a recurrent issue.

## 2020-05-16 NOTE — Assessment & Plan Note (Signed)
Concern for pancreatitis versus diverticulitis. Will check CMP, CBC, lipase and if lipase elevated treat for pancreatitis. If no elevation will start augmentin which is prescribed today for diverticulitis. If no improvement on treatment or worsening we discussed potential need for CT abdomen.

## 2020-05-16 NOTE — Patient Instructions (Addendum)
The EKG is the same today. We will check the labs today and get back in touch about the results.   Until we know keep a liquid diet. We have sent in augmentin to start taking if the labs do not show a pancreas problem.

## 2020-05-17 ENCOUNTER — Encounter: Payer: Self-pay | Admitting: Internal Medicine

## 2020-05-19 ENCOUNTER — Encounter: Payer: Self-pay | Admitting: Internal Medicine

## 2020-05-21 ENCOUNTER — Encounter: Payer: Self-pay | Admitting: Internal Medicine

## 2020-05-22 ENCOUNTER — Telehealth (INDEPENDENT_AMBULATORY_CARE_PROVIDER_SITE_OTHER): Payer: Medicare PPO | Admitting: Internal Medicine

## 2020-05-22 ENCOUNTER — Encounter: Payer: Self-pay | Admitting: Internal Medicine

## 2020-05-22 ENCOUNTER — Other Ambulatory Visit: Payer: Self-pay

## 2020-05-22 DIAGNOSIS — N3000 Acute cystitis without hematuria: Secondary | ICD-10-CM | POA: Diagnosis not present

## 2020-05-22 MED ORDER — SULFAMETHOXAZOLE-TRIMETHOPRIM 800-160 MG PO TABS: 1.0000 | ORAL_TABLET | Freq: Two times a day (BID) | ORAL | 0 refills | Status: DC

## 2020-05-22 NOTE — Progress Notes (Signed)
Virtual Visit via Video Note  I connected with Erica [name] on 05/22/20 at  1:20 PM EST by a video enabled telemedicine application and verified that I am speaking with the correct person using two identifiers.  The patient and the provider were at separate locations throughout the entire encounter. Patient location: home, Provider location: work   I discussed the limitations of evaluation and management by telemedicine and the availability of in person appointments. The patient expressed understanding and agreed to proceed. The patient and the provider were the only parties present for the visit unless noted in HPI below.  History of Present Illness: The patient is a 79 y.o. female with visit for possible UTI. Seen last week with abdominal pain suspected diverticulitis. She then had symptoms more consistent with past kidney stones and stopped taking augmentin. She did have relief of that pain with passage of the stone several days later. Is drinking plenty of fluids. But feels got UTI with passage about 2-3 days ago. Some burning and discomfort with urination. Denies fevers or chills today but ran fever 100.4 for several evenings in a row since onset. Did get severe GI side effects with augmentin.   Observations/Objective: Appearance: normal, breathing appears normal, normal grooming, abdomen does not appear distended, throat normal, memory normal, mental status is A and O times 3  Assessment and Plan: See problem oriented charting  Follow Up Instructions: rx bactrim for presumed UTI  I discussed the assessment and treatment plan with the patient. The patient was provided an opportunity to ask questions and all were answered. The patient agreed with the plan and demonstrated an understanding of the instructions.   The patient was advised to call back or seek an in-person evaluation if the symptoms worsen or if the condition fails to improve as anticipated.  Hoyt Koch, MD

## 2020-05-23 DIAGNOSIS — N3000 Acute cystitis without hematuria: Secondary | ICD-10-CM | POA: Insufficient documentation

## 2020-05-23 NOTE — Assessment & Plan Note (Signed)
Rx for bactrim

## 2020-05-25 ENCOUNTER — Encounter: Payer: Self-pay | Admitting: Internal Medicine

## 2020-06-15 ENCOUNTER — Ambulatory Visit: Payer: Medicare PPO | Admitting: Internal Medicine

## 2020-06-15 ENCOUNTER — Encounter: Payer: Self-pay | Admitting: Internal Medicine

## 2020-06-15 ENCOUNTER — Other Ambulatory Visit: Payer: Self-pay

## 2020-06-15 VITALS — BP 120/72 | HR 86 | Temp 98.2°F | Ht 64.0 in | Wt 145.0 lb

## 2020-06-15 DIAGNOSIS — E119 Type 2 diabetes mellitus without complications: Secondary | ICD-10-CM

## 2020-06-15 DIAGNOSIS — E559 Vitamin D deficiency, unspecified: Secondary | ICD-10-CM | POA: Diagnosis not present

## 2020-06-15 DIAGNOSIS — E538 Deficiency of other specified B group vitamins: Secondary | ICD-10-CM

## 2020-06-15 DIAGNOSIS — G6289 Other specified polyneuropathies: Secondary | ICD-10-CM

## 2020-06-15 LAB — HEMOGLOBIN A1C: Hgb A1c MFr Bld: 6.4 % (ref 4.6–6.5)

## 2020-06-15 LAB — COMPREHENSIVE METABOLIC PANEL
ALT: 13 U/L (ref 0–35)
AST: 12 U/L (ref 0–37)
Albumin: 3.7 g/dL (ref 3.5–5.2)
Alkaline Phosphatase: 58 U/L (ref 39–117)
BUN: 17 mg/dL (ref 6–23)
CO2: 29 mEq/L (ref 19–32)
Calcium: 9 mg/dL (ref 8.4–10.5)
Chloride: 105 mEq/L (ref 96–112)
Creatinine, Ser: 0.95 mg/dL (ref 0.40–1.20)
GFR: 56.05 mL/min — ABNORMAL LOW (ref >60.00)
Glucose, Bld: 120 mg/dL — ABNORMAL HIGH (ref 70–99)
Potassium: 4 mEq/L (ref 3.5–5.1)
Sodium: 139 mEq/L (ref 135–145)
Total Bilirubin: 0.4 mg/dL (ref 0.2–1.2)
Total Protein: 6.6 g/dL (ref 6.0–8.3)

## 2020-06-15 LAB — TSH: TSH: 2.15 u[IU]/mL (ref 0.35–4.50)

## 2020-06-15 LAB — CBC
HCT: 42.7 % (ref 36.0–46.0)
Hemoglobin: 14.1 g/dL (ref 12.0–15.0)
MCHC: 32.9 g/dL (ref 30.0–36.0)
MCV: 90.4 fl (ref 78.0–100.0)
Platelets: 246 10*3/uL (ref 150.0–400.0)
RBC: 4.72 Mil/uL (ref 3.87–5.11)
RDW: 13.7 % (ref 11.5–15.5)
WBC: 6.8 10*3/uL (ref 4.0–10.5)

## 2020-06-15 LAB — VITAMIN B12: Vitamin B-12: 1048 pg/mL — ABNORMAL HIGH (ref 211–911)

## 2020-06-15 LAB — VITAMIN D 25 HYDROXY (VIT D DEFICIENCY, FRACTURES): VITD: 25.39 ng/mL — ABNORMAL LOW (ref 30.00–100.00)

## 2020-06-15 LAB — FERRITIN: Ferritin: 184.4 ng/mL (ref 10.0–291.0)

## 2020-06-15 MED ORDER — GABAPENTIN 100 MG PO CAPS
100.0000 mg | ORAL_CAPSULE | Freq: Every day | ORAL | 3 refills | Status: DC
Start: 2020-06-15 — End: 2021-12-07

## 2020-06-15 NOTE — Assessment & Plan Note (Signed)
Checking vitamin D level. Adjust as needed.  

## 2020-06-15 NOTE — Assessment & Plan Note (Signed)
Checking B12 level, doing in home monthly injections.

## 2020-06-15 NOTE — Progress Notes (Signed)
   Subjective:   Patient ID: Erica [name], female    DOB: 01/14/1941, 79 y.o.   MRN: [id number]  HPI The patient is an 79 YO female coming in for peripheral neuropathy. Gradually worsening over the past several months to year or so. Mostly bothersome at nighttime. Was in the toes and now is the whole foot. Has not tried anything for it. Denies skin breakdown or ulcer on the feet. Would like to try something as this is bothersome and keeps her from getting to sleep as well.   Review of Systems  Constitutional: Negative.   HENT: Negative.   Eyes: Negative.   Respiratory: Negative for cough, chest tightness and shortness of breath.   Cardiovascular: Negative for chest pain, palpitations and leg swelling.  Gastrointestinal: Negative for abdominal distention, abdominal pain, constipation, diarrhea, nausea and vomiting.  Musculoskeletal: Negative.   Skin: Negative.   Neurological: Positive for numbness.       Neuropathy feet bilateral  Psychiatric/Behavioral: Negative.     Objective:  Physical Exam Constitutional:      Appearance: She is well-developed and well-nourished.  HENT:     Head: Normocephalic and atraumatic.  Eyes:     Extraocular Movements: EOM normal.  Cardiovascular:     Rate and Rhythm: Normal rate and regular rhythm.  Pulmonary:     Effort: Pulmonary effort is normal. No respiratory distress.     Breath sounds: Normal breath sounds. No wheezing or rales.  Abdominal:     General: Bowel sounds are normal. There is no distension.     Palpations: Abdomen is soft.     Tenderness: There is no abdominal tenderness. There is no rebound.  Musculoskeletal:        General: No edema.     Cervical back: Normal range of motion.  Skin:    General: Skin is warm and dry.  Neurological:     Mental Status: She is alert and oriented to person, place, and time.     Coordination: Coordination normal.  Psychiatric:        Mood and Affect: Mood and affect normal.     Vitals:    06/15/20 0935  BP: 120/72  Pulse: 86  Temp: 98.2 F (36.8 C)  TempSrc: Oral  SpO2: 97%  Weight: 145 lb (65.8 kg)  Height: 5\' 4"  (1.626 m)    This visit occurred during the SARS-CoV-2 public health emergency.  Safety protocols were in place, including screening questions prior to the visit, additional usage of staff PPE, and extensive cleaning of exam room while observing appropriate contact time as indicated for disinfecting solutions.   Assessment & Plan:

## 2020-06-15 NOTE — Assessment & Plan Note (Signed)
Checking HgA1c for any changes. Diet controlled.

## 2020-06-15 NOTE — Assessment & Plan Note (Signed)
Rx gabapentin 100 mg to try at night time for symptoms. Checking thyroid, B12, vitamin D, iron, HgA1c for any changes and treat as appropriate.

## 2020-06-15 NOTE — Patient Instructions (Signed)
We can get another cologuard in April 2022 which would likely be adequate screening.  We will check the labs today and have sent in gabapentin 100 mg to try taking at night time for the neuropathy.

## 2020-06-18 ENCOUNTER — Encounter: Payer: Self-pay | Admitting: Internal Medicine

## 2020-07-24 ENCOUNTER — Encounter (INDEPENDENT_AMBULATORY_CARE_PROVIDER_SITE_OTHER): Payer: Medicare PPO | Admitting: Ophthalmology

## 2020-08-17 ENCOUNTER — Encounter (HOSPITAL_COMMUNITY): Payer: Self-pay

## 2020-08-17 ENCOUNTER — Emergency Department (HOSPITAL_COMMUNITY)
Admission: EM | Admit: 2020-08-17 | Discharge: 2020-08-17 | Disposition: A | Discharge status: Home / Self Care | Payer: Medicare PPO | Attending: Emergency Medicine | Admitting: Emergency Medicine

## 2020-08-17 ENCOUNTER — Emergency Department (HOSPITAL_COMMUNITY): Payer: Medicare PPO

## 2020-08-17 ENCOUNTER — Other Ambulatory Visit: Payer: Self-pay

## 2020-08-17 DIAGNOSIS — E1142 Type 2 diabetes mellitus with diabetic polyneuropathy: Secondary | ICD-10-CM | POA: Diagnosis not present

## 2020-08-17 DIAGNOSIS — Z7982 Long term (current) use of aspirin: Secondary | ICD-10-CM | POA: Diagnosis not present

## 2020-08-17 DIAGNOSIS — K449 Diaphragmatic hernia without obstruction or gangrene: Secondary | ICD-10-CM | POA: Diagnosis not present

## 2020-08-17 DIAGNOSIS — N200 Calculus of kidney: Secondary | ICD-10-CM | POA: Diagnosis not present

## 2020-08-17 DIAGNOSIS — J45909 Unspecified asthma, uncomplicated: Secondary | ICD-10-CM | POA: Insufficient documentation

## 2020-08-17 DIAGNOSIS — K5730 Diverticulosis of large intestine without perforation or abscess without bleeding: Secondary | ICD-10-CM | POA: Diagnosis not present

## 2020-08-17 DIAGNOSIS — N132 Hydronephrosis with renal and ureteral calculous obstruction: Secondary | ICD-10-CM | POA: Insufficient documentation

## 2020-08-17 DIAGNOSIS — Z79899 Other long term (current) drug therapy: Secondary | ICD-10-CM | POA: Diagnosis not present

## 2020-08-17 DIAGNOSIS — M4316 Spondylolisthesis, lumbar region: Secondary | ICD-10-CM | POA: Diagnosis not present

## 2020-08-17 DIAGNOSIS — R1032 Left lower quadrant pain: Secondary | ICD-10-CM | POA: Diagnosis present

## 2020-08-17 LAB — CBC WITH DIFFERENTIAL/PLATELET
Abs Immature Granulocytes: 0.04 10*3/uL (ref 0.00–0.07)
Basophils Absolute: 0.1 10*3/uL (ref 0.0–0.1)
Basophils Relative: 1 %
Eosinophils Absolute: 0.2 10*3/uL (ref 0.0–0.5)
Eosinophils Relative: 3 %
HCT: 40.4 % (ref 36.0–46.0)
Hemoglobin: 13.4 g/dL (ref 12.0–15.0)
Immature Granulocytes: 1 %
Lymphocytes Relative: 32 %
Lymphs Abs: 2.5 10*3/uL (ref 0.7–4.0)
MCH: 30.7 pg (ref 26.0–34.0)
MCHC: 33.2 g/dL (ref 30.0–36.0)
MCV: 92.4 fL (ref 80.0–100.0)
Monocytes Absolute: 0.7 10*3/uL (ref 0.1–1.0)
Monocytes Relative: 9 %
Neutro Abs: 4.2 10*3/uL (ref 1.7–7.7)
Neutrophils Relative %: 54 %
Platelets: 253 10*3/uL (ref 150–400)
RBC: 4.37 MIL/uL (ref 3.87–5.11)
RDW: 13.3 % (ref 11.5–15.5)
WBC: 7.7 10*3/uL (ref 4.0–10.5)
nRBC: 0 % (ref 0.0–0.2)

## 2020-08-17 LAB — COMPREHENSIVE METABOLIC PANEL
ALT: 19 U/L (ref 0–44)
AST: 18 U/L (ref 15–41)
Albumin: 4.1 g/dL (ref 3.5–5.0)
Alkaline Phosphatase: 63 U/L (ref 38–126)
Anion gap: 3 — ABNORMAL LOW (ref 5–15)
BUN: 21 mg/dL (ref 8–23)
CO2: 23 mmol/L (ref 22–32)
Calcium: 8.9 mg/dL (ref 8.9–10.3)
Chloride: 114 mmol/L — ABNORMAL HIGH (ref 98–111)
Creatinine, Ser: 0.97 mg/dL (ref 0.44–1.00)
GFR, Estimated: 58 mL/min — ABNORMAL LOW (ref >60)
Glucose, Bld: 126 mg/dL — ABNORMAL HIGH (ref 70–99)
Potassium: 3.8 mmol/L (ref 3.5–5.1)
Sodium: 140 mmol/L (ref 135–145)
Total Bilirubin: 0.6 mg/dL (ref 0.3–1.2)
Total Protein: 7 g/dL (ref 6.5–8.1)

## 2020-08-17 LAB — URINALYSIS, ROUTINE W REFLEX MICROSCOPIC
Bilirubin Urine: NEGATIVE
Glucose, UA: NEGATIVE mg/dL
Hgb urine dipstick: NEGATIVE
Ketones, ur: NEGATIVE mg/dL
Leukocytes,Ua: NEGATIVE
Nitrite: NEGATIVE
Protein, ur: NEGATIVE mg/dL
Specific Gravity, Urine: 1.006 (ref 1.005–1.030)
pH: 6 (ref 5.0–8.0)

## 2020-08-17 NOTE — ED Triage Notes (Signed)
Patient c/o lower abdominal pain, dysuria, and bilateral lower back pain. Patient has a history of kidney stones.

## 2020-08-17 NOTE — ED Provider Notes (Signed)
[facility] DEPT Provider Note   CSN: [id number] Arrival date & time: 08/17/20  1811     History Chief Complaint  Patient presents with  . Abdominal Pain  . Back Pain  . Dysuria    Erica [name] is a 80 y.o. female.  The history is provided by the patient.  Abdominal Pain Pain location:  L flank and LLQ Pain quality: shooting and stabbing   Pain radiates to:  Groin Pain severity:  Moderate Onset quality:  Sudden Duration:  1 day Timing:  Intermittent Progression:  Worsening Chronicity:  Recurrent Context comment:  Patient reports yesterday she had the pain in her left flank that lasted for several hours throughout the middle of the night and then resolved around 2 AM and she was able to go to sleep.  She was fine throughout the day until 4 PM when the pain restarted Relieved by: improved with advil. Worsened by:  Nothing Ineffective treatments:  None tried Associated symptoms: dysuria   Associated symptoms: no anorexia, no constipation, no cough, no diarrhea, no fever, no hematuria, no nausea and no vomiting   Risk factors comment:  Hx of renal stones and passed 2 in the last few months Back Pain Associated symptoms: abdominal pain and dysuria   Associated symptoms: no fever   Dysuria Associated symptoms: abdominal pain   Associated symptoms: no fever, no nausea and no vomiting        Past Medical History:  Diagnosis Date  . Allergic rhinitis   . Arthritis   . Asthma   . Diet-controlled type 2 diabetes mellitus ([place]) 09/2011 dx  . Diverticulosis   . Dyslipidemia   . Macular degeneration    genetic etiology, not age-related per retinal specailist  . Recurrent kidney stones     Patient Active Problem List   Diagnosis Date Noted  . Acute cystitis 05/23/2020  . Epigastric pain 05/16/2020  . Dystrophies primarily involving the retinal pigment epithelium 01/20/2020  . Intermediate stage nonexudative age-related macular  degeneration of both eyes 01/20/2020  . Vitreomacular adhesion of right eye 01/20/2020  . Advanced nonexudative age-related macular degeneration of both eyes with subfoveal involvement 01/20/2020  . Neck pain 04/03/2018  . Low back pain 10/15/2016  . Dysphagia 08/07/2016  . Lichen planus 77/82/4235  . B12 deficiency 11/12/2015  . Routine general medical examination at a health care facility 11/12/2015  . Adjustment disorder with mixed anxiety and depressed mood 03/07/2015  . Dyslipidemia   . Peripheral neuropathy 05/19/2012  . Vitamin D deficiency disease 05/19/2012  . Osteopenia 05/19/2012  . Osteoarthritis 05/19/2012  . Kidney stones 05/19/2012  . Diet-controlled type 2 diabetes mellitus ([place]) 05/19/2012    Past Surgical History:  Procedure Laterality Date  . CATARACT EXTRACTION Bilateral 01/2015   L on 8/8, R on 8/22  . DILATION AND CURETTAGE OF UTERUS    . TONSILLECTOMY AND ADENOIDECTOMY       OB History   No obstetric history on file.     Family History  Problem Relation Age of Onset  . Heart disease Father   . Hyperlipidemia Father   . Hypertension Father   . Colon cancer Mother   . Arthritis Mother   . Cancer Mother        colon cancer  . Diabetes Brother   . Hyperlipidemia Son   . Diabetes Paternal Aunt     Social History   Tobacco Use  . Smoking status: Never Smoker  . Smokeless tobacco:  Never Used  . 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[place] but in [place] since [place] Use  . Vaping Use: Never used  Substance Use Topics  . Alcohol use: Yes    Alcohol/week: 3.0 standard drinks    Types: 3 Glasses of wine per week    Comment: can't drink red wine anymore; on glass of white wine rarely  . Drug use: No    Home Medications Prior to Admission medications   Medication Sig Start Date End Date Taking? Authorizing Provider  aspirin 81 MG tablet Take 81 mg by mouth daily.    [provider]  cetirizine (ZYRTEC) 10 MG tablet Take 10 mg by mouth daily.    [provider]  clobetasol ointment (TEMOVATE) 0.05 % Use thin film twice daily for 2 weeks, then daily for 2 months, then 3 times per week for 2 months, then once a week for 2 months then stop. 11/16/15   Hoyt Koch, MD  cyanocobalamin (,VITAMIN B-12,) 1000 MCG/ML injection INJECT 1 ML (1,000 MCG TOTAL) INTO THE MUSCLE EVERY 21 ( TWENTY-ONE) DAYS. 12/06/19   Hoyt Koch, MD  gabapentin (NEURONTIN) 100 MG capsule Take 1-2 capsules (100-200 mg total) by mouth at bedtime. 06/15/20   Hoyt Koch, MD  loperamide (IMODIUM) 2 MG capsule Take 2 mg by mouth as needed for diarrhea or loose stools. Reported on 07/26/2015    [provider]  Multiple Vitamins-Minerals (PRESERVISION AREDS PO) Take by mouth. Reported on 11/16/2015    [provider]  SYRINGE-NEEDLE, DISP, 3 ML (BD SAFETYGLIDE SYRINGE/NEEDLE) 25G X 1" 3 ML MISC Used to inject B12 05/25/18   Hoyt Koch, MD    Allergies    Augmentin [amoxicillin-pot clavulanate], Scallops [shellfish allergy], and Statins  Review of Systems   Review of Systems  Constitutional: Negative for fever.  Respiratory: Negative for cough.   Gastrointestinal: Positive for abdominal pain. Negative for anorexia, constipation, diarrhea, nausea and vomiting.  Genitourinary: Positive for dysuria. Negative for hematuria.  Musculoskeletal: Positive for back pain.  All other systems reviewed and are negative.   Physical Exam Updated Vital Signs BP (!) 152/77 (BP Location: Left Arm)   Pulse 78   Temp (!) 97.5 F (36.4 C) (Oral)   Resp 14   Ht 5\' 4"  (1.626 m)   Wt 65.8 kg   SpO2 98%   BMI 24.89 kg/m   Physical Exam Vitals and nursing note reviewed.  Constitutional:      General: She is in acute distress.     Appearance: She is well-developed and well-nourished.  HENT:     Head: Normocephalic and atraumatic.  Eyes:     Extraocular  Movements: EOM normal.     Pupils: Pupils are equal, round, and reactive to light.  Cardiovascular:     Rate and Rhythm: Normal rate and regular rhythm.     Pulses: Intact distal pulses.     Heart sounds: Normal heart sounds. No murmur heard. No friction rub.  Pulmonary:     Effort: Pulmonary effort is normal.     Breath sounds: Normal breath sounds. No wheezing or rales.  Abdominal:     General: Bowel sounds are normal. There is no distension.     Palpations: Abdomen is soft.     Tenderness: There is no abdominal tenderness. There is left CVA tenderness. There is no guarding or rebound.  Musculoskeletal:  General: No tenderness. Normal range of motion.     Comments: No edema  Skin:    General: Skin is warm and dry.     Findings: No rash.  Neurological:     Mental Status: She is alert and oriented to person, place, and time.     Cranial Nerves: No cranial nerve deficit.  Psychiatric:        Mood and Affect: Mood and affect normal.        Behavior: Behavior normal.     ED Results / Procedures / Treatments   Labs (all labs ordered are listed, but only abnormal results are displayed) Labs Reviewed  URINALYSIS, ROUTINE W REFLEX MICROSCOPIC - Abnormal; Notable for the following components:      Result Value   Color, Urine STRAW (*)    All other components within normal limits  COMPREHENSIVE METABOLIC PANEL - Abnormal; Notable for the following components:   Chloride 114 (*)    Glucose, Bld 126 (*)    GFR, Estimated 58 (*)    Anion gap 3 (*)    All other components within normal limits  CBC WITH DIFFERENTIAL/PLATELET    EKG None  Radiology CT Renal Stone Study  Result Date: 08/17/2020 CLINICAL DATA:  Bilateral lower back pain and dysuria. EXAM: CT ABDOMEN AND PELVIS WITHOUT CONTRAST TECHNIQUE: Multidetector CT imaging of the abdomen and pelvis was performed following the standard protocol without IV contrast. COMPARISON:  October 12, 2012 FINDINGS: Lower chest: No  acute abnormality. Hepatobiliary: No focal liver abnormality is seen. No gallstones, gallbladder wall thickening, or biliary dilatation. Pancreas: Unremarkable. No pancreatic ductal dilatation or surrounding inflammatory changes. Spleen: Normal in size without focal abnormality. Adrenals/Urinary Tract: Adrenal glands are unremarkable. Kidneys are normal, without focal lesions. A 2 mm nonobstructing renal stone is seen within the posterior aspect of the mid left kidney. A 4 mm calcification is seen along the expected course of the distal left ureter. This is near the left UVJ. Mild, stable thickening of the distal left ureter is also present. There is mild right-sided hydronephrosis and hydroureter without evidence of obstructing renal calculi. Bladder is unremarkable. Stomach/Bowel: There is a small hiatal hernia. Appendix appears normal. No evidence of bowel wall thickening, distention, or inflammatory changes. Noninflamed diverticula are seen within the descending and sigmoid colon. Vascular/Lymphatic: Mild aortic atherosclerosis. No enlarged abdominal or pelvic lymph nodes. Reproductive: Uterus and bilateral adnexa are unremarkable. Other: No abdominal wall hernia or abnormality. No abdominopelvic ascites. Musculoskeletal: Approximately 1 mm to 2 mm anterolisthesis of the L4 vertebral body is noted on L5. No acute osseous abnormalities are identified. IMPRESSION: 1. 4 mm distal left ureteral calculus with mild, stable thickening of the distal left ureter. 2. Mild right-sided hydronephrosis and hydroureter without evidence of obstructing renal calculi. 3. 2 mm nonobstructing renal stone within the left kidney. 4. Colonic diverticulosis. 5. Small hiatal hernia. 6. Mild aortic atherosclerosis. Aortic Atherosclerosis (ICD10-I70.0). Electronically Signed   By: Virgina Norfolk M.D.   On: 08/17/2020 20:50    Procedures Procedures   Medications Ordered in ED Medications - No data to display  ED Course  I have  reviewed the triage vital signs and the nursing notes.  Pertinent labs & imaging results that were available during my care of the patient were reviewed by me and considered in my medical decision making (see chart for details).    MDM Rules/Calculators/A&P  Pt with symptoms consistent with kidney stone and hx of the same but this will now be the 3rd one in the last 3 months.  Denies infectious sx, or GI symptoms.  Low concern for diverticulitis and AAA.  No hx suggestive of GU source (discharge) Will ensure no infection with UA, CBC, CMP and will get stone study to further eval. Pt took advil before comeing and currently does not need any pain medications.  10:14 PM Labs without acute findings.  UA within normal limits.  CT renal shows a 4 mm distal left ureteral calculus with mild stable thickening of the distal left ureter.  On reevaluation patient reports the pain is completely resolved at this time.  Findings discussed with the patient.  She was given return precautions.  She was given follow-up with urology.  MDM Number of Diagnoses or Management Options   Amount and/or Complexity of Data Reviewed Clinical lab tests: ordered and reviewed Tests in the radiology section of CPT: ordered and reviewed Decide to obtain previous medical records or to obtain history from someone other than the patient: yes Obtain history from someone other than the patient: no Review and summarize past medical records: yes Discuss the patient with other providers: no Independent visualization of images, tracings, or specimens: yes  Risk of Complications, Morbidity, and/or Mortality Presenting problems: moderate Diagnostic procedures: minimal Management options: minimal  Patient Progress Patient progress: improved    Final Clinical Impression(s) / ED Diagnoses Final diagnoses:  Kidney stone on left side    Rx / DC Orders ED Discharge Orders    None       Blanchie Dessert, MD 08/17/20 2216

## 2020-08-17 NOTE — Discharge Instructions (Signed)
If the pain returns and is not controlled with ibuprofen or you start running a fever or vomiting you should return to the emergency room.

## 2020-08-18 ENCOUNTER — Encounter: Payer: Self-pay | Admitting: Internal Medicine

## 2020-08-18 DIAGNOSIS — N200 Calculus of kidney: Secondary | ICD-10-CM

## 2020-08-30 DIAGNOSIS — H26493 Other secondary cataract, bilateral: Secondary | ICD-10-CM | POA: Diagnosis not present

## 2020-08-30 DIAGNOSIS — Z961 Presence of intraocular lens: Secondary | ICD-10-CM | POA: Diagnosis not present

## 2020-08-30 DIAGNOSIS — H3554 Dystrophies primarily involving the retinal pigment epithelium: Secondary | ICD-10-CM | POA: Diagnosis not present

## 2020-08-30 DIAGNOSIS — H40013 Open angle with borderline findings, low risk, bilateral: Secondary | ICD-10-CM | POA: Diagnosis not present

## 2020-09-14 DIAGNOSIS — N202 Calculus of kidney with calculus of ureter: Secondary | ICD-10-CM | POA: Diagnosis not present

## 2020-09-15 ENCOUNTER — Other Ambulatory Visit: Payer: Self-pay | Admitting: Urology

## 2020-09-15 DIAGNOSIS — N201 Calculus of ureter: Secondary | ICD-10-CM

## 2020-09-22 ENCOUNTER — Encounter: Payer: Self-pay | Admitting: Internal Medicine

## 2020-09-22 ENCOUNTER — Telehealth (INDEPENDENT_AMBULATORY_CARE_PROVIDER_SITE_OTHER): Payer: Medicare PPO | Admitting: Internal Medicine

## 2020-09-22 ENCOUNTER — Other Ambulatory Visit: Payer: Self-pay

## 2020-09-22 DIAGNOSIS — N200 Calculus of kidney: Secondary | ICD-10-CM | POA: Diagnosis not present

## 2020-09-22 MED ORDER — TAMSULOSIN HCL 0.4 MG PO CAPS: 0.4000 mg | ORAL_CAPSULE | Freq: Every day | ORAL | 0 refills | Status: DC

## 2020-09-22 NOTE — Assessment & Plan Note (Signed)
Rx flomax 1 month for expulsion. Continue to drink plenty of fluids. CT scan reviewed and no hydronephrosis and some thickening of the ureter so we talked about how this may have been more chronic.

## 2020-09-22 NOTE — Progress Notes (Signed)
Virtual Visit via Video Note  I connected with Erica [name] on 09/22/20 at  3:20 PM EDT by a video enabled telemedicine application and verified that I am speaking with the correct person using two identifiers.  The patient and the provider were at separate locations throughout the entire encounter. Patient location: home, Provider location: work   I discussed the limitations of evaluation and management by telemedicine and the availability of in person appointments. The patient expressed understanding and agreed to proceed. The patient and the provider were the only parties present for the visit unless noted in HPI below.  History of Present Illness: The patient is a 80 y.o. female with visit for questions about recent kidney stone. Does not want to get lithotripsy and is worried about that. Some pain in the left lower quadrant where the stone is. ER visit end of February. She is drinking plenty of fluids. Overall pain is stable. Not increasing and tolerable and intermittent.   Observations/Objective: Appearance: normal, breathing appears normal, casual grooming, abdomen does not appear distended, mental status is A and O times 3  Assessment and Plan: See problem oriented charting  Follow Up Instructions: rx flomax for 1 month, continue increased fluids  I discussed the assessment and treatment plan with the patient. The patient was provided an opportunity to ask questions and all were answered. The patient agreed with the plan and demonstrated an understanding of the instructions.   The patient was advised to call back or seek an in-person evaluation if the symptoms worsen or if the condition fails to improve as anticipated.  Hoyt Koch, MD

## 2020-09-28 ENCOUNTER — Encounter: Payer: Self-pay | Admitting: Internal Medicine

## 2020-09-28 ENCOUNTER — Telehealth: Payer: Medicare PPO | Admitting: Internal Medicine

## 2020-10-16 ENCOUNTER — Other Ambulatory Visit: Payer: Self-pay | Admitting: Internal Medicine

## 2020-10-20 ENCOUNTER — Other Ambulatory Visit (HOSPITAL_COMMUNITY): Payer: Medicare PPO

## 2020-10-23 ENCOUNTER — Ambulatory Visit (HOSPITAL_BASED_OUTPATIENT_CLINIC_OR_DEPARTMENT_OTHER): Admit: 2020-10-23 | Payer: Medicare PPO | Admitting: Urology

## 2020-10-23 ENCOUNTER — Encounter (HOSPITAL_BASED_OUTPATIENT_CLINIC_OR_DEPARTMENT_OTHER): Payer: Self-pay

## 2020-10-23 SURGERY — LITHOTRIPSY, ESWL
Anesthesia: LOCAL | Laterality: Left

## 2020-10-26 DIAGNOSIS — D225 Melanocytic nevi of trunk: Secondary | ICD-10-CM | POA: Diagnosis not present

## 2020-10-26 DIAGNOSIS — R208 Other disturbances of skin sensation: Secondary | ICD-10-CM | POA: Diagnosis not present

## 2020-10-26 DIAGNOSIS — L821 Other seborrheic keratosis: Secondary | ICD-10-CM | POA: Diagnosis not present

## 2020-10-30 ENCOUNTER — Other Ambulatory Visit: Payer: Self-pay | Admitting: Internal Medicine

## 2020-12-27 ENCOUNTER — Encounter: Payer: Self-pay | Admitting: Internal Medicine

## 2021-01-09 DIAGNOSIS — L570 Actinic keratosis: Secondary | ICD-10-CM | POA: Diagnosis not present

## 2021-01-09 DIAGNOSIS — D225 Melanocytic nevi of trunk: Secondary | ICD-10-CM | POA: Diagnosis not present

## 2021-01-09 DIAGNOSIS — D2262 Melanocytic nevi of left upper limb, including shoulder: Secondary | ICD-10-CM | POA: Diagnosis not present

## 2021-01-09 DIAGNOSIS — L821 Other seborrheic keratosis: Secondary | ICD-10-CM | POA: Diagnosis not present

## 2021-01-09 DIAGNOSIS — L814 Other melanin hyperpigmentation: Secondary | ICD-10-CM | POA: Diagnosis not present

## 2021-01-09 DIAGNOSIS — L820 Inflamed seborrheic keratosis: Secondary | ICD-10-CM | POA: Diagnosis not present

## 2021-01-29 ENCOUNTER — Other Ambulatory Visit: Payer: Self-pay | Admitting: Internal Medicine

## 2021-03-26 ENCOUNTER — Encounter: Payer: Self-pay | Admitting: Internal Medicine

## 2021-03-26 DIAGNOSIS — G629 Polyneuropathy, unspecified: Secondary | ICD-10-CM | POA: Diagnosis not present

## 2021-03-26 DIAGNOSIS — H3530 Unspecified macular degeneration: Secondary | ICD-10-CM | POA: Diagnosis not present

## 2021-03-26 DIAGNOSIS — J45909 Unspecified asthma, uncomplicated: Secondary | ICD-10-CM | POA: Diagnosis not present

## 2021-03-26 DIAGNOSIS — Z791 Long term (current) use of non-steroidal anti-inflammatories (NSAID): Secondary | ICD-10-CM | POA: Diagnosis not present

## 2021-03-26 DIAGNOSIS — G8929 Other chronic pain: Secondary | ICD-10-CM | POA: Diagnosis not present

## 2021-03-26 DIAGNOSIS — M1990 Unspecified osteoarthritis, unspecified site: Secondary | ICD-10-CM | POA: Diagnosis not present

## 2021-03-26 DIAGNOSIS — Z809 Family history of malignant neoplasm, unspecified: Secondary | ICD-10-CM | POA: Diagnosis not present

## 2021-03-26 DIAGNOSIS — Z7722 Contact with and (suspected) exposure to environmental tobacco smoke (acute) (chronic): Secondary | ICD-10-CM | POA: Diagnosis not present

## 2021-03-26 DIAGNOSIS — K219 Gastro-esophageal reflux disease without esophagitis: Secondary | ICD-10-CM | POA: Diagnosis not present

## 2021-03-27 NOTE — Telephone Encounter (Signed)
Due for physical if she wants to come in for that and get labs same day (or prior just let me know if prior)

## 2021-03-28 ENCOUNTER — Telehealth: Payer: Self-pay | Admitting: Internal Medicine

## 2021-03-28 DIAGNOSIS — E119 Type 2 diabetes mellitus without complications: Secondary | ICD-10-CM

## 2021-03-28 DIAGNOSIS — E538 Deficiency of other specified B group vitamins: Secondary | ICD-10-CM

## 2021-03-28 NOTE — Telephone Encounter (Signed)
See below

## 2021-03-28 NOTE — Telephone Encounter (Signed)
Patient scheduled cpe for 10/13.Marland Kitchenwould like for provider to place order for labs including A1C so she can have these done before scheduled cpe  Please let patient know when lab orders have been placed

## 2021-03-29 ENCOUNTER — Other Ambulatory Visit (INDEPENDENT_AMBULATORY_CARE_PROVIDER_SITE_OTHER): Payer: Medicare PPO

## 2021-03-29 DIAGNOSIS — E119 Type 2 diabetes mellitus without complications: Secondary | ICD-10-CM | POA: Diagnosis not present

## 2021-03-29 DIAGNOSIS — E538 Deficiency of other specified B group vitamins: Secondary | ICD-10-CM

## 2021-03-29 NOTE — Telephone Encounter (Signed)
See below

## 2021-03-29 NOTE — Telephone Encounter (Signed)
Labs are entered

## 2021-03-29 NOTE — Telephone Encounter (Signed)
Follow up message   Patient also requesting B12 be tested. Requesting to do labs today

## 2021-03-30 ENCOUNTER — Encounter: Payer: Self-pay | Admitting: Internal Medicine

## 2021-03-30 LAB — COMPREHENSIVE METABOLIC PANEL
ALT: 14 U/L (ref 0–35)
AST: 16 U/L (ref 0–37)
Albumin: 3.9 g/dL (ref 3.5–5.2)
Alkaline Phosphatase: 58 U/L (ref 39–117)
BUN: 19 mg/dL (ref 6–23)
CO2: 25 mEq/L (ref 19–32)
Calcium: 9 mg/dL (ref 8.4–10.5)
Chloride: 106 mEq/L (ref 96–112)
Creatinine, Ser: 0.86 mg/dL (ref 0.40–1.20)
GFR: 62.81 mL/min (ref >60.00)
Glucose, Bld: 99 mg/dL (ref 70–99)
Potassium: 4 mEq/L (ref 3.5–5.1)
Sodium: 139 mEq/L (ref 135–145)
Total Bilirubin: 0.5 mg/dL (ref 0.2–1.2)
Total Protein: 6.5 g/dL (ref 6.0–8.3)

## 2021-03-30 LAB — CBC
HCT: 42.1 % (ref 36.0–46.0)
Hemoglobin: 13.9 g/dL (ref 12.0–15.0)
MCHC: 33 g/dL (ref 30.0–36.0)
MCV: 91.1 fl (ref 78.0–100.0)
Platelets: 244 10*3/uL (ref 150.0–400.0)
RBC: 4.62 Mil/uL (ref 3.87–5.11)
RDW: 13.1 % (ref 11.5–15.5)
WBC: 6.3 10*3/uL (ref 4.0–10.5)

## 2021-03-30 LAB — HEMOGLOBIN A1C: Hgb A1c MFr Bld: 6.3 % (ref 4.6–6.5)

## 2021-03-30 LAB — MICROALBUMIN / CREATININE URINE RATIO
Creatinine,U: 88.7 mg/dL
Microalb Creat Ratio: 0.8 mg/g (ref 0.0–30.0)
Microalb, Ur: <0.7 mg/dL (ref 0.0–1.9)

## 2021-03-30 LAB — LIPID PANEL
Cholesterol: 192 mg/dL (ref 0–200)
HDL: 40.5 mg/dL (ref >39.00)
LDL Cholesterol: 126 mg/dL — ABNORMAL HIGH (ref 0–99)
NonHDL: 151.76
Total CHOL/HDL Ratio: 5
Triglycerides: 128 mg/dL (ref 0.0–149.0)
VLDL: 25.6 mg/dL (ref 0.0–40.0)

## 2021-03-30 LAB — VITAMIN B12: Vitamin B-12: 479 pg/mL (ref 211–911)

## 2021-03-30 NOTE — Telephone Encounter (Signed)
Should still have refill at pharmacy contact them for refill. Okay for verbal to call pharmacy if needed to okay early refill. Insurance may not cover so she may have to pay out of pocket.

## 2021-04-02 ENCOUNTER — Other Ambulatory Visit: Payer: Self-pay

## 2021-04-02 ENCOUNTER — Telehealth (INDEPENDENT_AMBULATORY_CARE_PROVIDER_SITE_OTHER): Payer: Medicare PPO | Admitting: Internal Medicine

## 2021-04-02 DIAGNOSIS — U071 COVID-19: Secondary | ICD-10-CM | POA: Diagnosis not present

## 2021-04-02 DIAGNOSIS — E119 Type 2 diabetes mellitus without complications: Secondary | ICD-10-CM

## 2021-04-02 DIAGNOSIS — E559 Vitamin D deficiency, unspecified: Secondary | ICD-10-CM

## 2021-04-02 MED ORDER — NIRMATRELVIR/RITONAVIR (PAXLOVID)TABLET: 3.0000 | ORAL_TABLET | Freq: Two times a day (BID) | ORAL | 0 refills | Status: AC

## 2021-04-02 NOTE — Progress Notes (Signed)
Patient ID: Erica [name], female   DOB: 1940-09-19, 80 y.o.   MRN: [id number]  Virtual Visit via Video Note  I connected with [name] on 04/02/21 at  3:00 PM EDT by a video enabled telemedicine application and verified that I am speaking with the correct person using two identifiers.  Location of all participants today Patient: at home Provider: at office   I discussed the limitations of evaluation and management by telemedicine and the availability of in person appointments. The patient expressed understanding and agreed to proceed.  History of Present Illness: Here with 4 days onset URi symptoms, found covid + at home testing x 3 days with congestion, ST, and scant prod cough but without n/v, sob, diarrhea or other symptoms. Taste and smelll ok. Pt with covid injection x 4.  Pt denies chest pain, increased sob or doe, wheezing, orthopnea, PND, increased LE swelling, palpitations, dizziness or syncope.   Pt denies polydipsia, polyuria, or new focal neuro s/s.   Pt denies fever, wt loss, night sweats, loss of appetite, or other constitutional symptoms  Past Medical History:  Diagnosis Date   Allergic rhinitis    Arthritis    Asthma    Diet-controlled type 2 diabetes mellitus ([place]) 09/2011 dx   Diverticulosis    Dyslipidemia    Macular degeneration    genetic etiology, not age-related per retinal specailist   Recurrent kidney stones    Past Surgical History:  Procedure Laterality Date   CATARACT EXTRACTION Bilateral 01/2015   L on 8/8, R on 8/22   DILATION AND CURETTAGE OF UTERUS     TONSILLECTOMY AND ADENOIDECTOMY      reports that she has never smoked. She has never used smokeless tobacco. She reports current alcohol use of about 3.0 standard drinks per week. She reports that she does not use drugs. family history includes Arthritis in her mother; Cancer in her mother; Colon cancer in her mother; Diabetes in her brother and paternal aunt; Heart disease in her father;  Hyperlipidemia in her father and son; Hypertension in her father. Allergies  Allergen Reactions   Augmentin [Amoxicillin-Pot Clavulanate] Nausea And Vomiting   Scallops [Shellfish Allergy] Nausea And Vomiting    Scallops only   Statins Other (See Comments)    Phobia of statins   Current Outpatient Medications on File Prior to Visit  Medication Sig Dispense Refill   aspirin 81 MG tablet Take 81 mg by mouth See admin instructions. Takes 1 tablet by mouth 3 or 4 days a week     cetirizine (ZYRTEC) 10 MG tablet Take 10 mg by mouth daily as needed for allergies.     clobetasol ointment (TEMOVATE) 0.05 % Use thin film twice daily for 2 weeks, then daily for 2 months, then 3 times per week for 2 months, then once a week for 2 months then stop. (Patient not taking: No sig reported) 90 g 2   cyanocobalamin (,VITAMIN B-12,) 1000 MCG/ML injection INJECT 1 ML (1,000 MCG TOTAL) INTO THE MUSCLE EVERY 21 ( TWENTY-ONE) DAYS. 4 mL 3   gabapentin (NEURONTIN) 100 MG capsule Take 1-2 capsules (100-200 mg total) by mouth at bedtime. (Patient not taking: No sig reported) 60 capsule 3   Magnesium 100 MG TABS Take 100 tablets by mouth daily.     Multiple Vitamins-Minerals (PRESERVISION AREDS PO) Take 1 tablet by mouth daily.     SYRINGE-NEEDLE, DISP, 3 ML (BD SAFETYGLIDE SYRINGE/NEEDLE) 25G X 1" 3 ML MISC Used to inject B12  12 each 0   tamsulosin (FLOMAX) 0.4 MG CAPS capsule Take 1 capsule (0.4 mg total) by mouth daily. 30 capsule 0   No current facility-administered medications on file prior to visit.    Observations/Objective: Alert, NAD, appropriate mood and affect, resps normal, cn 2-12 intact, moves all 4s, no visible rash or swelling Lab Results  Component Value Date   WBC 6.3 03/30/2021   HGB 13.9 03/30/2021   HCT 42.1 03/30/2021   PLT 244.0 03/30/2021   GLUCOSE 99 03/30/2021   CHOL 192 03/30/2021   TRIG 128.0 03/30/2021   HDL 40.50 03/30/2021   LDLDIRECT 113.0 11/27/2016   LDLCALC 126 (H)  03/30/2021   ALT 14 03/30/2021   AST 16 03/30/2021   NA 139 03/30/2021   K 4.0 03/30/2021   CL 106 03/30/2021   CREATININE 0.86 03/30/2021   BUN 19 03/30/2021   CO2 25 03/30/2021   TSH 2.15 06/15/2020   INR 1.0 01/24/2009   HGBA1C 6.3 03/30/2021   MICROALBUR <0.7 03/30/2021   Assessment and Plan: See notes  Follow Up Instructions: See notes   I discussed the assessment and treatment plan with the patient. The patient was provided an opportunity to ask questions and all were answered. The patient agreed with the plan and demonstrated an understanding of the instructions.   The patient was advised to call back or seek an in-person evaluation if the symptoms worsen or if the condition fails to improve as anticipated.   Cathlean Cower, MD

## 2021-04-03 ENCOUNTER — Encounter: Payer: Self-pay | Admitting: Internal Medicine

## 2021-04-03 DIAGNOSIS — U071 COVID-19: Secondary | ICD-10-CM | POA: Insufficient documentation

## 2021-04-03 NOTE — Assessment & Plan Note (Signed)
Mild to mod, for paxlovid course,  to f/u any worsening symptoms or concerns

## 2021-04-03 NOTE — Assessment & Plan Note (Signed)
Last vitamin D Lab Results  Component Value Date   VD25OH 25.39 (L) 06/15/2020   Low, to start oral replacement

## 2021-04-03 NOTE — Assessment & Plan Note (Signed)
Lab Results  Component Value Date   HGBA1C 6.3 03/30/2021   Stable, pt to continue current medical treatment  - diet

## 2021-04-03 NOTE — Patient Instructions (Signed)
Please take all new medication as prescribed 

## 2021-04-05 ENCOUNTER — Encounter: Payer: Medicare PPO | Admitting: Internal Medicine

## 2021-04-10 ENCOUNTER — Other Ambulatory Visit: Payer: Self-pay

## 2021-04-10 ENCOUNTER — Ambulatory Visit (INDEPENDENT_AMBULATORY_CARE_PROVIDER_SITE_OTHER): Payer: Medicare PPO | Admitting: Internal Medicine

## 2021-04-10 ENCOUNTER — Other Ambulatory Visit: Payer: Self-pay | Admitting: Internal Medicine

## 2021-04-10 ENCOUNTER — Encounter: Payer: Self-pay | Admitting: Internal Medicine

## 2021-04-10 VITALS — BP 116/69 | HR 69 | Resp 18 | Ht 64.0 in | Wt 140.8 lb

## 2021-04-10 DIAGNOSIS — E785 Hyperlipidemia, unspecified: Secondary | ICD-10-CM

## 2021-04-10 DIAGNOSIS — E1169 Type 2 diabetes mellitus with other specified complication: Secondary | ICD-10-CM | POA: Diagnosis not present

## 2021-04-10 DIAGNOSIS — E119 Type 2 diabetes mellitus without complications: Secondary | ICD-10-CM | POA: Diagnosis not present

## 2021-04-10 DIAGNOSIS — G6289 Other specified polyneuropathies: Secondary | ICD-10-CM

## 2021-04-10 DIAGNOSIS — E538 Deficiency of other specified B group vitamins: Secondary | ICD-10-CM | POA: Diagnosis not present

## 2021-04-10 DIAGNOSIS — Z0000 Encounter for general adult medical examination without abnormal findings: Secondary | ICD-10-CM | POA: Diagnosis not present

## 2021-04-10 MED ORDER — FREESTYLE LIBRE 14 DAY SENSOR MISC: 0 refills | Status: DC

## 2021-04-10 MED ORDER — FREESTYLE LIBRE 14 DAY READER DEVI: 0 refills | Status: DC

## 2021-04-10 NOTE — Progress Notes (Signed)
   Subjective:   Patient ID: Erica [name], female    DOB: 1940-07-31, 80 y.o.   MRN: [id number]  HPI The patient is an 80 YO female coming in for physical.   PMH, [place], social history reviewed and updated  Review of Systems  Constitutional: Negative.   HENT: Negative.    Eyes: Negative.   Respiratory:  Negative for cough, chest tightness and shortness of breath.   Cardiovascular:  Negative for chest pain, palpitations and leg swelling.  Gastrointestinal:  Negative for abdominal distention, abdominal pain, constipation, diarrhea, nausea and vomiting.  Musculoskeletal: Negative.   Skin: Negative.   Neurological: Negative.   Psychiatric/Behavioral: Negative.     Objective:  Physical Exam Constitutional:      Appearance: She is well-developed.  HENT:     Head: Normocephalic and atraumatic.  Cardiovascular:     Rate and Rhythm: Normal rate and regular rhythm.  Pulmonary:     Effort: Pulmonary effort is normal. No respiratory distress.     Breath sounds: Normal breath sounds. No wheezing or rales.  Abdominal:     General: Bowel sounds are normal. There is no distension.     Palpations: Abdomen is soft.     Tenderness: There is no abdominal tenderness. There is no rebound.  Musculoskeletal:     Cervical back: Normal range of motion.  Skin:    General: Skin is warm and dry.  Neurological:     Mental Status: She is alert and oriented to person, place, and time.     Coordination: Coordination normal.    Vitals:   04/10/21 1355  BP: 116/69  Pulse: 69  Resp: 18  SpO2: 97%  Weight: 140 lb 12.8 oz (63.9 kg)  Height: 5\' 4"  (1.626 m)    This visit occurred during the SARS-CoV-2 public health emergency.  Safety protocols were in place, including screening questions prior to the visit, additional usage of staff PPE, and extensive cleaning of exam room while observing appropriate contact time as indicated for disinfecting solutions.   Assessment & Plan:

## 2021-04-10 NOTE — Patient Instructions (Addendum)
We have sent in the glucose monitor to see if dietary foods impact you.  We will get the calcium score to see if we need to be more aggressive about the heart.

## 2021-04-11 ENCOUNTER — Other Ambulatory Visit: Payer: Self-pay | Admitting: Internal Medicine

## 2021-04-13 NOTE — Assessment & Plan Note (Signed)
Flu shot declines today. Covid-19 counseled about booster. Pneumonia complete. Shingrix declines 2nd to complete series. Tetanus due declines. Colonoscopy aged out. Mammogram aged out, pap smear aged out and dexa declines further. Counseled about sun safety and mole surveillance. Counseled about the dangers of distracted driving. Given 10 year screening recommendations.

## 2021-04-13 NOTE — Assessment & Plan Note (Signed)
Foot exam done and prescribed continuous glucose monitoring so she can understand and tailor her diet to her response to carbohydrates. Reviewed recent labs with her today and stable control. She is not taking ACE-I/ARB or statin.

## 2021-04-13 NOTE — Assessment & Plan Note (Signed)
Still using gabapentin 300-600 mg qhs for pain.

## 2021-04-13 NOTE — Assessment & Plan Note (Signed)
Recent B12 level discussed and at goal on monthly injections.

## 2021-04-13 NOTE — Assessment & Plan Note (Signed)
Declines statins. Goal LDL <100 (<70 ideal).

## 2021-04-17 ENCOUNTER — Other Ambulatory Visit: Payer: Self-pay

## 2021-04-17 ENCOUNTER — Ambulatory Visit (HOSPITAL_COMMUNITY)
Admission: RE | Admit: 2021-04-17 | Discharge: 2021-04-17 | Disposition: A | Discharge status: Home / Self Care | Payer: Self-pay | Source: Ambulatory Visit | Attending: Internal Medicine | Admitting: Internal Medicine

## 2021-04-17 ENCOUNTER — Encounter: Payer: Self-pay | Admitting: Internal Medicine

## 2021-04-17 DIAGNOSIS — Z136 Encounter for screening for cardiovascular disorders: Secondary | ICD-10-CM | POA: Insufficient documentation

## 2021-04-23 ENCOUNTER — Other Ambulatory Visit: Payer: Self-pay | Admitting: Internal Medicine

## 2021-04-25 ENCOUNTER — Other Ambulatory Visit: Payer: Self-pay | Admitting: Internal Medicine

## 2021-05-01 ENCOUNTER — Encounter: Payer: Self-pay | Admitting: Internal Medicine

## 2021-05-01 DIAGNOSIS — Z9189 Other specified personal risk factors, not elsewhere classified: Secondary | ICD-10-CM

## 2021-05-30 ENCOUNTER — Encounter: Payer: Self-pay | Admitting: Internal Medicine

## 2021-05-30 ENCOUNTER — Ambulatory Visit: Payer: Medicare PPO | Admitting: Internal Medicine

## 2021-05-30 ENCOUNTER — Other Ambulatory Visit: Payer: Self-pay

## 2021-05-30 VITALS — BP 132/68 | HR 73 | Ht 64.0 in | Wt 144.2 lb

## 2021-05-30 DIAGNOSIS — E1169 Type 2 diabetes mellitus with other specified complication: Secondary | ICD-10-CM | POA: Diagnosis not present

## 2021-05-30 DIAGNOSIS — Z79899 Other long term (current) drug therapy: Secondary | ICD-10-CM

## 2021-05-30 DIAGNOSIS — E785 Hyperlipidemia, unspecified: Secondary | ICD-10-CM

## 2021-05-30 MED ORDER — ROSUVASTATIN CALCIUM 10 MG PO TABS: 10.0000 mg | ORAL_TABLET | Freq: Every day | ORAL | 3 refills | Status: DC

## 2021-05-30 MED ORDER — ASPIRIN EC 81 MG PO TBEC: 81.0000 mg | DELAYED_RELEASE_TABLET | Freq: Every day | ORAL | 3 refills | Status: AC

## 2021-05-30 NOTE — Progress Notes (Signed)
Cardiology Office Note   Date:  05/30/2021   ID:  Erica, [name] 01-Nov-1940, MRN [id number]  PCP:  Hoyt Koch, MD  Cardiologist:   Dorris Carnes, MD   Pt referred for elevated calciume score   History of Present Illness: Erica [name] is a 80 y.o. female with a history of dyslipidemia   She is followed by Erica [name]   Had a Ca score done   This was 181     The pt is active  She had 2 episodes CP that occurred with mental stress   none with activtity  She dances regularly   No problems    Breathing is Ok   No dizzienss  no palpitations   Diet:  Breakfast: 10AM   omlette couple days   [place]   Fruit    toast  [place] Lunch  Leftovers at 3 PM Dinner  730 and 8   Fish 2x per week   pork   veggies   rice/potato Drinks   water    glass wine2x per month diet tonic water      Current Meds  Medication Sig   aspirin 81 MG tablet Take 81 mg by mouth See admin instructions. Takes 1 tablet by mouth 3 or 4 days a week   cetirizine (ZYRTEC) 10 MG tablet Take 10 mg by mouth daily as needed for allergies.   Continuous Blood Gluc Receiver (FREESTYLE LIBRE 14 DAY READER) DEVI Use to monitor sugar   Continuous Blood Gluc Sensor (FREESTYLE LIBRE 14 DAY SENSOR) MISC Use to monitor   cyanocobalamin (,VITAMIN B-12,) 1000 MCG/ML injection INJECT 1 ML (1,000 MCG TOTAL) INTO THE MUSCLE EVERY 21 ( TWENTY-ONE) DAYS.   gabapentin (NEURONTIN) 100 MG capsule Take 1-2 capsules (100-200 mg total) by mouth at bedtime.   Magnesium 100 MG TABS Take 100 tablets by mouth daily.   Multiple Vitamins-Minerals (PRESERVISION AREDS PO) Take 1 tablet by mouth daily.   SYRINGE-NEEDLE, DISP, 3 ML (BD SAFETYGLIDE SYRINGE/NEEDLE) 25G X 1" 3 ML MISC Used to inject B12     Allergies:   Augmentin [amoxicillin-pot clavulanate], Other, and Scallops [shellfish allergy]   Past Medical History:  Diagnosis Date   Allergic rhinitis    Arthritis    Asthma    Diet-controlled type 2 diabetes mellitus ([place]) 09/2011  dx   Diverticulosis    Dyslipidemia    Macular degeneration    genetic etiology, not age-related per retinal specailist   Recurrent kidney stones     Past Surgical History:  Procedure Laterality Date   CATARACT EXTRACTION Bilateral 01/2015   L on 8/8, R on 8/22   DILATION AND CURETTAGE OF UTERUS     TONSILLECTOMY AND ADENOIDECTOMY       Social History:  The patient  reports that she has never smoked. She has never used smokeless tobacco. She reports current alcohol use of about 3.0 standard drinks per week. She reports that she does not use drugs.   Family History:  The patient's family history includes Arthritis in her mother; Cancer in her mother; Colon cancer in her mother; Diabetes in her brother and paternal aunt; Heart disease in her father; Hyperlipidemia in her father and son; Hypertension in her father.    ROS:  Please see the history of present illness. All other systems are reviewed and  Negative to the above problem except as noted.    PHYSICAL EXAM: VS:  BP 132/68   Pulse 73   Ht  5\' 4"  (1.626 m)   Wt 144 lb 3.2 oz (65.4 kg)   SpO2 96%   BMI 24.75 kg/m   GEN: Well nourished, well developed, in no acute distress  HEENT: normal  Neck: no JVD, carotid bruits, or masses Cardiac: RRR; no murmurs, rubs, or gallops,no edema  Respiratory:  clear to auscultation bilaterally, normal work of breathing GI: soft, nontender, nondistended, + BS  No hepatomegaly  MS: no deformity Moving all extremities   Skin: warm and dry, no rash Neuro:  Strength and sensation are intact Psych: euthymic mood, full affect   EKG:  EKG is ordered today.  NSR 74 bpm   Incomp RBBB   Lipid Panel    Component Value Date/Time   CHOL 192 03/30/2021 0853   TRIG 128.0 03/30/2021 0853   HDL 40.50 03/30/2021 0853   CHOLHDL 5 03/30/2021 0853   VLDL 25.6 03/30/2021 0853   LDLCALC 126 (H) 03/30/2021 0853   LDLDIRECT 113.0 11/27/2016 1514      Wt Readings from Last 3 Encounters:  05/30/21  144 lb 3.2 oz (65.4 kg)  04/10/21 140 lb 12.8 oz (63.9 kg)  08/17/20 145 lb (65.8 kg)      ASSESSMENT AND PLAN:  1  CAD   Mild elevation in Ca score  Places her in 56th percentile   Reviewed   I would recomm ecASA 81 mg daily    Also start statin  2  Dyslipidemia    Needs tighter control of lipids    I would recom mCrestor 10   F/U lipmed in 8 wks     3  Diet  Discussed diet   A1c was 6.3   needs to cut down on carbs/sweets some    Discussed TRE     Clinical follow up in about 1 year   Sooner for problems     Current medicines are reviewed at length with the patient today.  The patient does not have concerns regarding medicines.  Signed, Dorris Carnes, MD  05/30/2021 3:22 PM    [facility] [place], [place] [address] [place], [place]  [address] Phone: [phone]; Fax: [phone]

## 2021-05-30 NOTE — Patient Instructions (Signed)
Medication Instructions:   START CRESTOR 10 MG DAILY  ASPIRIN 81 MG DAILY   *If you need a refill on your cardiac medications before your next appointment, please call your pharmacy*   Lab Work: Trainer 8 WEEKS   If you have labs (blood work) drawn today and your tests are completely normal, you will receive your results only by: [place] (if you have MyChart) OR A paper copy in the mail If you have any lab test that is abnormal or we need to change your treatment, we will call you to review the results.   Testing/Procedures: NONE   Follow-Up: At [facility], you and your health needs are our priority.  As part of our continuing mission to provide you with exceptional heart care, we have created designated Provider Care Teams.  These Care Teams include your primary Cardiologist (physician) and Advanced Practice Providers (APPs -  Physician Assistants and Nurse Practitioners) who all work together to provide you with the care you need, when you need it.  We recommend signing up for the patient portal called "MyChart".  Sign up information is provided on this After Visit Summary.  MyChart is used to connect with patients for Virtual Visits (Telemedicine).  Patients are able to view lab/test results, encounter notes, upcoming appointments, etc.  Non-urgent messages can be sent to your provider as well.   To learn more about what you can do with MyChart, go to [email or web address].    Your next appointment:   9 month(s)  The format for your next appointment:   In Person  Provider:   None     Other Instructions

## 2021-06-07 ENCOUNTER — Encounter: Payer: Self-pay | Admitting: Internal Medicine

## 2021-06-07 MED ORDER — OSELTAMIVIR PHOSPHATE 75 MG PO CAPS: 75.0000 mg | ORAL_CAPSULE | Freq: Two times a day (BID) | ORAL | 0 refills | Status: DC

## 2021-06-07 NOTE — Addendum Note (Signed)
Addended by: Patterson Hammersmith A on: 06/07/2021 09:34 AM   Modules accepted: Orders

## 2021-06-29 ENCOUNTER — Ambulatory Visit: Payer: Medicare PPO | Admitting: Internal Medicine

## 2021-07-10 DIAGNOSIS — D649 Anemia, unspecified: Secondary | ICD-10-CM | POA: Diagnosis not present

## 2021-07-10 DIAGNOSIS — G629 Polyneuropathy, unspecified: Secondary | ICD-10-CM | POA: Diagnosis not present

## 2021-07-10 DIAGNOSIS — G8929 Other chronic pain: Secondary | ICD-10-CM | POA: Diagnosis not present

## 2021-07-10 DIAGNOSIS — Z809 Family history of malignant neoplasm, unspecified: Secondary | ICD-10-CM | POA: Diagnosis not present

## 2021-07-10 DIAGNOSIS — Z8249 Family history of ischemic heart disease and other diseases of the circulatory system: Secondary | ICD-10-CM | POA: Diagnosis not present

## 2021-07-10 DIAGNOSIS — Z791 Long term (current) use of non-steroidal anti-inflammatories (NSAID): Secondary | ICD-10-CM | POA: Diagnosis not present

## 2021-07-10 DIAGNOSIS — Z833 Family history of diabetes mellitus: Secondary | ICD-10-CM | POA: Diagnosis not present

## 2021-07-10 DIAGNOSIS — H3530 Unspecified macular degeneration: Secondary | ICD-10-CM | POA: Diagnosis not present

## 2021-07-10 DIAGNOSIS — K219 Gastro-esophageal reflux disease without esophagitis: Secondary | ICD-10-CM | POA: Diagnosis not present

## 2021-07-20 ENCOUNTER — Telehealth: Payer: Self-pay

## 2021-07-20 ENCOUNTER — Ambulatory Visit: Payer: Medicare PPO

## 2021-07-20 ENCOUNTER — Other Ambulatory Visit: Payer: Self-pay

## 2021-07-20 NOTE — Telephone Encounter (Signed)
Called patient for 1115  MWV-S , Patient declined stated she completed [place] last week with [facility] insurance in her home .  L.Tyresha Fede,LPN

## 2021-07-20 NOTE — Progress Notes (Signed)
Made in error Patient completed with [facility] last week and request cancelling this appointment .  L.Chong Wojdyla,LPN

## 2021-07-23 ENCOUNTER — Encounter: Payer: Self-pay | Admitting: Internal Medicine

## 2021-08-01 ENCOUNTER — Other Ambulatory Visit: Payer: Medicare PPO | Admitting: *Deleted

## 2021-08-01 ENCOUNTER — Other Ambulatory Visit: Payer: Self-pay

## 2021-08-01 DIAGNOSIS — Z79899 Other long term (current) drug therapy: Secondary | ICD-10-CM

## 2021-08-01 DIAGNOSIS — E785 Hyperlipidemia, unspecified: Secondary | ICD-10-CM

## 2021-08-01 DIAGNOSIS — E1169 Type 2 diabetes mellitus with other specified complication: Secondary | ICD-10-CM

## 2021-08-02 ENCOUNTER — Encounter: Payer: Self-pay | Admitting: Internal Medicine

## 2021-08-02 DIAGNOSIS — E1169 Type 2 diabetes mellitus with other specified complication: Secondary | ICD-10-CM

## 2021-08-02 DIAGNOSIS — E785 Hyperlipidemia, unspecified: Secondary | ICD-10-CM

## 2021-08-02 DIAGNOSIS — Z79899 Other long term (current) drug therapy: Secondary | ICD-10-CM

## 2021-08-03 LAB — NMR, LIPOPROFILE
Cholesterol, Total: 191 mg/dL (ref 100–199)
HDL Particle Number: 33.7 umol/L (ref >=30.5)
HDL-C: 46 mg/dL (ref >39)
LDL Particle Number: 1844 nmol/L — ABNORMAL HIGH (ref <1000)
LDL Size: 20.3 nm — ABNORMAL LOW (ref >20.5)
LDL-C (NIH Calc): 124 mg/dL — ABNORMAL HIGH (ref 0–99)
LP-IR Score: 59 — ABNORMAL HIGH (ref <=45)
Small LDL Particle Number: 1077 nmol/L — ABNORMAL HIGH (ref <=527)
Triglycerides: 114 mg/dL (ref 0–149)

## 2021-08-03 LAB — APOLIPOPROTEIN B: Apolipoprotein B: 104 mg/dL — ABNORMAL HIGH (ref <90)

## 2021-08-03 NOTE — Telephone Encounter (Signed)
Patient reacted to Crestor with muscle achiness Lipitor is metabolized very different  May not lead to a reaction like Crestor   Would try 10 mg lipitor    If she develops similar symptoms would need repatha F/U lipid panel in 2 months with AST

## 2021-08-07 MED ORDER — ATORVASTATIN CALCIUM 10 MG PO TABS: 10.0000 mg | ORAL_TABLET | Freq: Every day | ORAL | 3 refills | Status: DC

## 2021-09-04 DIAGNOSIS — H3554 Dystrophies primarily involving the retinal pigment epithelium: Secondary | ICD-10-CM | POA: Diagnosis not present

## 2021-09-04 DIAGNOSIS — Z961 Presence of intraocular lens: Secondary | ICD-10-CM | POA: Diagnosis not present

## 2021-09-04 DIAGNOSIS — H40013 Open angle with borderline findings, low risk, bilateral: Secondary | ICD-10-CM | POA: Diagnosis not present

## 2021-09-04 DIAGNOSIS — H26492 Other secondary cataract, left eye: Secondary | ICD-10-CM | POA: Diagnosis not present

## 2021-09-04 LAB — HM DIABETES EYE EXAM

## 2021-10-05 ENCOUNTER — Other Ambulatory Visit: Payer: Medicare PPO | Admitting: *Deleted

## 2021-10-05 DIAGNOSIS — E785 Hyperlipidemia, unspecified: Secondary | ICD-10-CM | POA: Diagnosis not present

## 2021-10-05 DIAGNOSIS — Z79899 Other long term (current) drug therapy: Secondary | ICD-10-CM | POA: Diagnosis not present

## 2021-10-05 DIAGNOSIS — E1169 Type 2 diabetes mellitus with other specified complication: Secondary | ICD-10-CM

## 2021-10-05 LAB — LIPID PANEL
Chol/HDL Ratio: 2.6 ratio (ref 0.0–4.4)
Cholesterol, Total: 129 mg/dL (ref 100–199)
HDL: 49 mg/dL (ref >39)
LDL Chol Calc (NIH): 67 mg/dL (ref 0–99)
Triglycerides: 59 mg/dL (ref 0–149)
VLDL Cholesterol Cal: 13 mg/dL (ref 5–40)

## 2021-10-05 LAB — AST: AST: 18 IU/L (ref 0–40)

## 2021-10-07 ENCOUNTER — Encounter: Payer: Self-pay | Admitting: Internal Medicine

## 2021-10-07 ENCOUNTER — Other Ambulatory Visit: Payer: Self-pay | Admitting: Internal Medicine

## 2021-12-07 ENCOUNTER — Ambulatory Visit: Payer: Medicare PPO | Admitting: Internal Medicine

## 2021-12-07 ENCOUNTER — Encounter: Payer: Self-pay | Admitting: Internal Medicine

## 2021-12-07 VITALS — BP 120/68 | HR 64 | Temp 98.4°F | Ht 64.0 in | Wt 142.0 lb

## 2021-12-07 DIAGNOSIS — E119 Type 2 diabetes mellitus without complications: Secondary | ICD-10-CM | POA: Diagnosis not present

## 2021-12-07 DIAGNOSIS — G6289 Other specified polyneuropathies: Secondary | ICD-10-CM | POA: Diagnosis not present

## 2021-12-07 DIAGNOSIS — E538 Deficiency of other specified B group vitamins: Secondary | ICD-10-CM

## 2021-12-07 LAB — POCT GLYCOSYLATED HEMOGLOBIN (HGB A1C): Hemoglobin A1C: 6.1 % — AB (ref 4.0–5.6)

## 2021-12-07 MED ORDER — GABAPENTIN 100 MG PO CAPS: 100.0000 mg | ORAL_CAPSULE | Freq: Every day | ORAL | 3 refills | Status: DC

## 2021-12-07 NOTE — Assessment & Plan Note (Signed)
She has not used gabapentin due to concern about sedation. Rx gabapentin 100 mg qhs to try as needed. Using advil prn qhs and okay to continue. We discussed capsaicin ointment as well. HgA1c done today and stable.

## 2021-12-07 NOTE — Progress Notes (Signed)
   Subjective:   Patient ID: Erica [name], female    DOB: 09-23-1940, 81 y.o.   MRN: [id number]  HPI The patient is an 81 YO female coming in for follow up.  Review of Systems  Constitutional: Negative.   HENT: Negative.    Eyes: Negative.   Respiratory:  Negative for cough, chest tightness and shortness of breath.   Cardiovascular:  Negative for chest pain, palpitations and leg swelling.  Gastrointestinal:  Negative for abdominal distention, abdominal pain, constipation, diarrhea, nausea and vomiting.  Musculoskeletal: Negative.   Skin: Negative.   Neurological:  Positive for numbness.  Psychiatric/Behavioral: Negative.      Objective:  Physical Exam Constitutional:      Appearance: She is well-developed.  HENT:     Head: Normocephalic and atraumatic.  Cardiovascular:     Rate and Rhythm: Normal rate and regular rhythm.  Pulmonary:     Effort: Pulmonary effort is normal. No respiratory distress.     Breath sounds: Normal breath sounds. No wheezing or rales.  Abdominal:     General: Bowel sounds are normal. There is no distension.     Palpations: Abdomen is soft.     Tenderness: There is no abdominal tenderness. There is no rebound.  Musculoskeletal:     Cervical back: Normal range of motion.  Skin:    General: Skin is warm and dry.  Neurological:     Mental Status: She is alert and oriented to person, place, and time.     Coordination: Coordination normal.     Vitals:   12/07/21 1437  BP: 120/68  Pulse: 64  Temp: 98.4 F (36.9 C)  TempSrc: Oral  SpO2: 95%  Weight: 142 lb (64.4 kg)  Height: '5\' 4"'$  (1.626 m)    Assessment & Plan:

## 2021-12-07 NOTE — Assessment & Plan Note (Signed)
Continue monthly B12 shots to help prevent progressive neuropathy.

## 2021-12-07 NOTE — Assessment & Plan Note (Signed)
POC HgA1c done today at 6.1 which is improved. She is managing through diet. Up to date on other screening related to this.

## 2021-12-07 NOTE — Patient Instructions (Signed)
You can try capsaicin cream on the feet. I have refilled the gabapentin in case you want to try one in the evening.

## 2022-01-14 ENCOUNTER — Ambulatory Visit: Payer: Medicare PPO

## 2022-02-11 ENCOUNTER — Telehealth: Payer: Self-pay | Admitting: Internal Medicine

## 2022-02-11 DIAGNOSIS — R002 Palpitations: Secondary | ICD-10-CM

## 2022-02-11 NOTE — Telephone Encounter (Signed)
Patient c/o Palpitations:  High priority if patient c/o lightheadedness, shortness of breath, or chest pain  How long have you had palpitations/irregular HR/ Afib? Are you having the symptoms now?  Afib started Friday, 8/18 night and patient has had 4 episodes since then. No symptoms currently  Are you currently experiencing lightheadedness, SOB or CP?  No   Do you have a history of afib (atrial fibrillation) or irregular heart rhythm?  No, this is the first occurrence   Have you checked your BP or HR? (document readings if available):  No readings available, HR normally in the 60's   Are you experiencing any other symptoms?  Fatigue

## 2022-02-11 NOTE — Telephone Encounter (Signed)
Spoke with pt and advised per Dr Rogers Seeds AT monitor has been ordered and pt will be contacted with monitor instructions.  After that pt will be added in for an appointment per Dr Harrington Challenger.  Pt verbalizes understanding and agrees with current plan.

## 2022-02-12 ENCOUNTER — Ambulatory Visit (INDEPENDENT_AMBULATORY_CARE_PROVIDER_SITE_OTHER): Payer: Medicare PPO

## 2022-02-12 DIAGNOSIS — R002 Palpitations: Secondary | ICD-10-CM

## 2022-02-12 NOTE — Progress Notes (Unsigned)
[employer] to mail one 14 day monitor around 02/14/22 and a second consecutive 7 day monitor to be delivered around 03/01/22.  Patient to wear monitor 21 days.  Patient returned 2nd monitor unused.

## 2022-02-13 DIAGNOSIS — R002 Palpitations: Secondary | ICD-10-CM

## 2022-02-14 DIAGNOSIS — R002 Palpitations: Secondary | ICD-10-CM | POA: Diagnosis not present

## 2022-02-25 NOTE — Progress Notes (Addendum)
Cardiology Office Note   Date:  02/26/2022   ID:  Erica, [name] [birth year], MRN [id number]  PCP:  Erica Koch, MD  Cardiologist:   Erica Carnes, MD   Pt presents for evaluation of palpitations   History of Present Illness: Erica [name] is a 81 y.o. female with a history of dyslipidemia and CP   She had a negative myoview in 2017 and again in 2019.   She is followed by Erica [name]   Had a Ca score done in Oct 2022   Ca score was 181     I saw her in clinic in Dec 2022    Af few wks ago she got the RSV vaccine   She says she felt like she had a bad case of the flu   The next several days she had heart racing     Would last 10 to 12 min  No dizziness   Overall got better     She is just feeling more normal, not back to baseline   Palpitations have resolved   Before vaccine she had 2 episodes of CP.  One occurred when climbing around [facility] up a hill   Came in house  and sat down  Fine after    The second episode was at home    STressed   Got a pretty signficiant pain in chest    Sat for 10 min   Fine after   Episodes  lasted 10 to 12 min       Very mild chest discomfort since    Not associated with activity     Pt has had L shoulder blade pain   COmes and goes.  No exacerbating factors     Diet Breakfast   Left overs   Likes protein   [place], veggies/rice   Broccoli salad Usuallly eats at 60  Lunch at 2    Leftovers or sandwich     [place] tea  Dinner  Same as other meals  Does have a sweet tooth, eats occasional dessert     Current Meds  Medication Sig   aspirin EC 81 MG tablet Take 1 tablet (81 mg total) by mouth daily. Swallow whole.   cetirizine (ZYRTEC) 10 MG tablet Take 10 mg by mouth daily as needed for allergies.   cyanocobalamin (,VITAMIN B-12,) 1000 MCG/ML injection INJECT 1 ML (1,000 MCG TOTAL) INTO THE MUSCLE EVERY 21 ( TWENTY-ONE) DAYS.   gabapentin (NEURONTIN) 100 MG capsule Take 1-2 capsules (100-200 mg total) by mouth at  bedtime.   Multiple Vitamins-Minerals (PRESERVISION AREDS PO) Take 1 tablet by mouth daily.   rosuvastatin (CRESTOR) 5 MG tablet Take 1 tablet (5 mg total) by mouth daily.   SYRINGE-NEEDLE, DISP, 3 ML (BD SAFETYGLIDE SYRINGE/NEEDLE) 25G X 1" 3 ML MISC Used to inject B12   [DISCONTINUED] atorvastatin (LIPITOR) 10 MG tablet Take 1 tablet (10 mg total) by mouth daily.     Allergies:   Augmentin [amoxicillin-pot clavulanate], Other, and Scallops [shellfish allergy]   Past Medical History:  Diagnosis Date   Allergic rhinitis    Arthritis    Asthma    Diet-controlled type 2 diabetes mellitus ([place]) 09/2011 dx   Diverticulosis    Dyslipidemia    Macular degeneration    genetic etiology, not age-related per retinal specailist   Recurrent kidney stones     Past Surgical History:  Procedure Laterality Date   CATARACT EXTRACTION Bilateral 01/2015   L  on 8/8, R on 8/22   DILATION AND CURETTAGE OF UTERUS     TONSILLECTOMY AND ADENOIDECTOMY       Social History:  The patient  reports that she has never smoked. She has never used smokeless tobacco. She reports current alcohol use of about 3.0 standard drinks of alcohol per week. She reports that she does not use drugs.   Family History:  The patient's family history includes Arthritis in her mother; Cancer in her mother; Colon cancer in her mother; Diabetes in her brother and paternal aunt; Heart disease in her father; Hyperlipidemia in her father and son; Hypertension in her father.    ROS:  Please see the history of present illness. All other systems are reviewed and  Negative to the above problem except as noted.    PHYSICAL EXAM: VS:  BP (!) 108/50   Pulse 73   Ht '5\' 4"'$  (1.626 m)   Wt 141 lb 9.6 oz (64.2 kg)   SpO2 94%   BMI 24.31 kg/m   GEN: Well nourished, well developed, in no acute distress  HEENT: normal  Neck: no JVD, carotid bruits Cardiac: RRR; no murmurs  No LE  edema  Respiratory:  clear to auscultation  bilaterally GI: soft, nontender, nondistended, + BS  No hepatomegaly  MS: no deformity Moving all extremities   Skin: warm and dry, no rash Neuro:  Strength and sensation are intact Psych: euthymic mood, full affect   EKG:  EKG is ordered today.  NSR 69 bpm   Incomp RBBB   Lipid Panel    Component Value Date/Time   CHOL 129 10/05/2021 0902   TRIG 59 10/05/2021 0902   HDL 49 10/05/2021 0902   CHOLHDL 2.6 10/05/2021 0902   CHOLHDL 5 03/30/2021 0853   VLDL 25.6 03/30/2021 0853   LDLCALC 67 10/05/2021 0902   LDLDIRECT 113.0 11/27/2016 1514      Wt Readings from Last 3 Encounters:  02/26/22 141 lb 9.6 oz (64.2 kg)  12/07/21 142 lb (64.4 kg)  05/30/21 144 lb 3.2 oz (65.4 kg)      ASSESSMENT AND PLAN:  1  Palpitations   Pt with palpitations after RSV vaccine.    Improved over a few wks    Close to baseline without complaints    She is just mailing her Zio patch back today  WIll review I did encourage her to get a [facility]     1  CAD   Mild elevation in Ca score  Places her in 56th percentile  Keep on ASA and a statin One episode of chest discomfort with walking    ALll other atypical     2  Dyslipidemia    Will switch to Crestor 5 mg     3  Diet  Discussed diet  Limit sugar    Current medicines are reviewed at length with the patient today.  The patient does not have concerns regarding medicines.  Signed, Erica Carnes, MD  02/26/2022 3:14 PM    [facility] [place], [place], [place]  [address] Phone: [phone]; Fax: [phone]

## 2022-02-26 ENCOUNTER — Encounter: Payer: Self-pay | Admitting: Internal Medicine

## 2022-02-26 ENCOUNTER — Ambulatory Visit: Payer: Medicare PPO | Attending: Internal Medicine | Admitting: Internal Medicine

## 2022-02-26 VITALS — BP 108/50 | HR 73 | Ht 64.0 in | Wt 141.6 lb

## 2022-02-26 DIAGNOSIS — E785 Hyperlipidemia, unspecified: Secondary | ICD-10-CM

## 2022-02-26 DIAGNOSIS — Z79899 Other long term (current) drug therapy: Secondary | ICD-10-CM

## 2022-02-26 DIAGNOSIS — R002 Palpitations: Secondary | ICD-10-CM

## 2022-02-26 DIAGNOSIS — E1169 Type 2 diabetes mellitus with other specified complication: Secondary | ICD-10-CM | POA: Diagnosis not present

## 2022-02-26 MED ORDER — ROSUVASTATIN CALCIUM 5 MG PO TABS: 5.0000 mg | ORAL_TABLET | Freq: Every day | ORAL | 3 refills | Status: DC

## 2022-02-26 NOTE — Patient Instructions (Signed)
Medication Instructions:  Crestor 5 mg daily   *If you need a refill on your cardiac medications before your next appointment, please call your pharmacy*   Lab Work: NMR and CK in 8 weeks   If you have labs (blood work) drawn today and your tests are completely normal, you will receive your results only by: [place] (if you have MyChart) OR A paper copy in the mail If you have any lab test that is abnormal or we need to change your treatment, we will call you to review the results.   Testing/Procedures:    Follow-Up: At [facility], you and your health needs are our priority.  As part of our continuing mission to provide you with exceptional heart care, we have created designated Provider Care Teams.  These Care Teams include your primary Cardiologist (physician) and Advanced Practice Providers (APPs -  Physician Assistants and Nurse Practitioners) who all work together to provide you with the care you need, when you need it.  We recommend signing up for the patient portal called "MyChart".  Sign up information is provided on this After Visit Summary.  MyChart is used to connect with patients for Virtual Visits (Telemedicine).  Patients are able to view lab/test results, encounter notes, upcoming appointments, etc.  Non-urgent messages can be sent to your provider as well.   To learn more about what you can do with MyChart, go to [email or web address].     Important Information About Sugar

## 2022-02-26 NOTE — Telephone Encounter (Signed)
Pt has appt 02/26/22.

## 2022-03-04 ENCOUNTER — Encounter: Payer: Self-pay | Admitting: Internal Medicine

## 2022-03-07 NOTE — Telephone Encounter (Signed)
Monitor is still being processed I think  Not in my box to read yet.   I did review it though SR   Average HR 79 bpm  Rare PAC, PVC   Few short bursts of SVt Diary entries correlated mostly with SR, occasionally with SR with PVC or PAC No significant arrhythmias  She could try, if bothered by palpitations, low dose Toprol XL   25 mg   Try 1/2    or 1 whole

## 2022-03-07 NOTE — Telephone Encounter (Signed)
Pt advised and she will hold off for now on taking the Toprol but will let us know if she changes her mind.   We went over ways to prevent her palpiations such as low caffeine, etoh, and OTC meds... hydrate and to get plenty of rest/ exercise.

## 2022-03-18 MED ORDER — METOPROLOL SUCCINATE ER 25 MG PO TB24: 25.0000 mg | ORAL_TABLET | Freq: Every day | ORAL | 3 refills | Status: DC

## 2022-03-18 NOTE — Addendum Note (Signed)
Addended by: Stephani Police on: 03/18/2022 09:27 AM   Modules accepted: Orders

## 2022-03-29 ENCOUNTER — Ambulatory Visit (INDEPENDENT_AMBULATORY_CARE_PROVIDER_SITE_OTHER): Payer: Medicare PPO

## 2022-03-29 VITALS — Ht 64.0 in | Wt 140.0 lb

## 2022-03-29 DIAGNOSIS — Z780 Asymptomatic menopausal state: Secondary | ICD-10-CM | POA: Diagnosis not present

## 2022-03-29 DIAGNOSIS — Z0000 Encounter for general adult medical examination without abnormal findings: Secondary | ICD-10-CM

## 2022-03-29 NOTE — Progress Notes (Signed)
Subjective:   Erica [name] is a 81 y.o. female who presents for Medicare Annual (Subsequent) preventive examination.   Virtual Visit via Telephone Note  I connected with  [name] on 03/29/22 at 11:15 AM EDT by telephone and verified that I am speaking with the correct person using two identifiers.  Location: Patient: home  Provider: [facility]  Persons participating in the virtual visit: patient/Nurse Health Advisor   I discussed the limitations, risks, security and privacy concerns of performing an evaluation and management service by telephone and the availability of in person appointments. The patient expressed understanding and agreed to proceed.  Interactive audio and video telecommunications were attempted between this nurse and patient, however failed, due to patient having technical difficulties OR patient did not have access to video capability.  We continued and completed visit with audio only.  Some vital signs may be absent or patient reported.   [name], LPN  Review of Systems     Cardiac Risk Factors include: advanced age (>42mn, >>30women);diabetes mellitus;dyslipidemia     Objective:    Today's Vitals   03/29/22 1120  Weight: 140 lb (63.5 kg)  Height: '5\' 4"'$  (1.626 m)   Body mass index is 24.03 kg/m.     03/29/2022   11:25 AM 08/17/2020    6:23 PM 02/18/2020    1:33 PM 02/12/2019    1:09 PM 01/08/2018    2:11 PM  Advanced Directives  Does Patient Have a Medical Advance Directive? Yes Yes Yes Yes Yes  Type of AParamedicof AL[place]ainLiving will [place]Living will Living will;Healthcare Power of [place]ross MountainLiving will [place]Living will  Copy of HParisin Chart? No - copy requested  No - copy requested No - copy requested No - copy requested    Current Medications (verified) Outpatient Encounter Medications as of 03/29/2022   Medication Sig   aspirin EC 81 MG tablet Take 1 tablet (81 mg total) by mouth daily. Swallow whole.   cetirizine (ZYRTEC) 10 MG tablet Take 10 mg by mouth daily as needed for allergies.   cyanocobalamin (,VITAMIN B-12,) 1000 MCG/ML injection INJECT 1 ML (1,000 MCG TOTAL) INTO THE MUSCLE EVERY 21 ( TWENTY-ONE) DAYS.   gabapentin (NEURONTIN) 100 MG capsule Take 1-2 capsules (100-200 mg total) by mouth at bedtime.   Multiple Vitamins-Minerals (PRESERVISION AREDS PO) Take 1 tablet by mouth daily.   rosuvastatin (CRESTOR) 5 MG tablet Take 1 tablet (5 mg total) by mouth daily.   SYRINGE-NEEDLE, DISP, 3 ML (BD SAFETYGLIDE SYRINGE/NEEDLE) 25G X 1" 3 ML MISC Used to inject B12   metoprolol succinate (TOPROL XL) 25 MG 24 hr tablet Take 1 tablet (25 mg total) by mouth daily. (Can start out taking 1/2 for a few days first) (Patient not taking: Reported on 03/29/2022)   No facility-administered encounter medications on file as of 03/29/2022.    Allergies (verified) Augmentin [amoxicillin-pot clavulanate], Other, and Scallops [shellfish allergy]   History: Past Medical History:  Diagnosis Date   Allergic rhinitis    Arthritis    Asthma    Diet-controlled type 2 diabetes mellitus ([place] 09[place] dx   Diverticulosis    Dyslipidemia    Macular degeneration    genetic etiology, not age-related per retinal specailist   Recurrent kidney stones    Past Surgical History:  Procedure Laterality Date   CATARACT EXTRACTION Bilateral 01/2015   L on 8/8, R on  8/22   DILATION AND CURETTAGE OF UTERUS     TONSILLECTOMY AND ADENOIDECTOMY     Family History  Problem Relation Age of Onset   Heart disease Father    Hyperlipidemia Father    Hypertension Father    Colon cancer Mother    Arthritis Mother    Cancer Mother        colon cancer   Diabetes Brother    Hyperlipidemia Son    Diabetes Paternal Aunt    Social History   Socioeconomic History   Marital status: Married    Spouse name: Not on file    Number of children: 2   Years of education: Not on file   Highest education level: Not on file  Occupational History   Occupation: Retired  Tobacco Use   Smoking status: Never   Smokeless tobacco: Never   Tobacco comments:    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[place] but in [place] since [place] Use   Vaping Use: Never used  Substance and Sexual Activity   Alcohol use: Yes    Alcohol/week: 3.0 standard drinks of alcohol    Types: 3 Glasses of wine per week    Comment: can't drink red wine anymore; on glass of white wine rarely   Drug use: No   Sexual activity: Yes  Other Topics Concern   Not on file  Social History Narrative   Regular exercise-yes   Caffeine Use-yes               Epworth Sleepiness Scale = 5 (as of 07/04/2015)   Social Determinants of Health   Financial Resource Strain: Low Risk  (03/29/2022)   Overall Financial Resource Strain (CARDIA)    Difficulty of Paying Living Expenses: Not hard at all  Food Insecurity: No Food Insecurity (03/29/2022)   Hunger Vital Sign    Worried About Running Out of Food in the Last Year: Never true    Ran Out of Food in the Last Year: Never true  Transportation Needs: No Transportation Needs (03/29/2022)   PRAPARE - Hydrologist (Medical): No    Lack of Transportation (Non-Medical): No  Physical Activity: Insufficiently Active (03/29/2022)   Exercise Vital Sign    Days of Exercise per Week: 3 days    Minutes of Exercise per Session: 30 min  Stress: No Stress Concern Present (03/29/2022)   [place]    Feeling of Stress : Not at all  Social Connections: Moderately Integrated (03/29/2022)   Social Connection and Isolation Panel [NHANES]    Frequency of Communication with Friends and Family: More than three times a week    Frequency of Social Gatherings with Friends and Family: More than three  times a week    Attends Religious Services: Never    Marine scientist or Organizations: Yes    Attends Music therapist: More than 4 times per year    Marital Status: Married    Tobacco Counseling Counseling given: Not Answered Tobacco comments: 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  No  Does the patient have any non-healing wounds?  No  Has the patient had any unintentional weight loss or weight gain?  No   Diabetes:  Is the patient diabetic?  Yes  If diabetic, was a CBG obtained today?  No  Did the patient bring in their glucometer from home?  No  How often do you monitor your CBG's? never.   Financial Strains and Diabetes Management:  Are you having any financial strains with the device, your supplies or your medication? Yes .  Does the patient want to be seen by Chronic Care Management for management of their diabetes?  No  Would the patient like to be referred to a Nutritionist or for Diabetic Management?  No   Diabetic Exams:  Diabetic Eye Exam: Completed 11/2021 Diabetic Foot Exam: Overdue, Pt has been advised about the importance in completing this exam. Pt is scheduled for diabetic foot exam on next office visit .   Interpreter Needed?: No  Information entered by :: Jadene Pierini, LPN   Activities of Daily Living    03/29/2022   11:25 AM 04/10/2021    2:00 PM  In your present state of health, do you have any difficulty performing the following activities:  Hearing? 0 0  Vision? 0 0  Difficulty  concentrating or making decisions? 0 0  Walking or climbing stairs? 0 0  Dressing or bathing? 0 0  Doing errands, shopping? 0 0  Preparing Food and eating ? N   Using the Toilet? N   In the past six months, have you accidently leaked urine? N   Do you have problems with loss of bowel control? N   Managing your Medications? N   Managing your Finances? N   Housekeeping or managing your Housekeeping? N     Patient Care Team: Hoyt Koch, MD as PCP - General (Internal Medicine) Clent Jacks, MD (Ophthalmology) Zadie Rhine Clent Demark, MD as Consulting Physician (Ophthalmology) [facility]  Indicate any recent Medical Services you may have received from other than [facility] providers in the past year (date may be approximate).     Assessment:   This is a routine wellness examination for Sybella.  Hearing/Vision screen Vision Screening - Comments:: Annual eye exams wear glasses   Dietary issues and exercise activities discussed: Current Exercise Habits: Home exercise routine, Time (Minutes): 30, Frequency (Times/Week): 3, Weekly Exercise (Minutes/Week): 90, Intensity: Mild, Exercise limited by: None identified   Goals Addressed   None    Depression Screen    03/29/2022   11:24 AM 12/07/2021    2:55 PM 05/16/2020   11:00 AM 02/18/2020    1:32 PM 10/05/2019   10:28 AM 02/12/2019    1:09 PM 01/08/2018    2:12 PM  PHQ 2/9 Scores  PHQ - 2 Score 0 0 0 0 0 0 0  PHQ- 9 Score       3    Fall Risk    03/29/2022   11:21 AM 12/07/2021    2:55 PM 05/16/2020   11:00 AM 02/18/2020    1:33 PM 10/05/2019   10:28 AM  [place] in the past year? 0 0 0 0 0  Number falls in past yr: 0  0 0 0  Injury with Fall? 0  0 0 0  Risk for fall due to : No Fall  Risks  No Fall Risks No Fall Risks   Follow up Falls prevention discussed  Falls evaluation completed Falls evaluation completed     [place]:  Any stairs in or around the home? No   If so, are there any without handrails? No  Home free of loose throw rugs in walkways, pet beds, electrical cords, etc? Yes  Adequate lighting in your home to reduce risk of falls? Yes   ASSISTIVE DEVICES UTILIZED TO PREVENT FALLS:  Life alert? No  Use of a cane, walker or w/c? No  Grab bars in the bathroom? Yes  Shower chair or bench in shower? Yes  Elevated toilet seat or a handicapped toilet? Yes          03/29/2022   11:26 AM  6CIT Screen  What Year? 0 points  What month? 0 points  What time? 0 points  Count back from 20 0 points  Months in reverse 0 points  Repeat phrase 0 points  Total Score 0 points    Immunizations Immunization History  Administered Date(s) Administered   Fluad Quad(high Dose 65+) 04/08/2019, 04/20/2020   Hepatitis A, Adult 02/24/2013   Influenza Split 03/08/2014   Influenza, High Dose Seasonal PF 04/18/2015, 03/19/2017   Influenza,inj,Quad PF,6+ Mos 02/24/2013   Influenza-Unspecified 04/03/2016, 04/02/2018   PFIZER Comirnaty(Gray Top)Covid-19 Tri-Sucrose Vaccine 08/15/2020, 01/11/2021, 03/15/2022   PFIZER(Purple Top)SARS-COV-2 Vaccination 07/29/2019, 08/24/2019   Pfizer Covid-19 Vaccine Bivalent Booster 73yr & up 05/22/2021   Pneumococcal Conjugate-13 05/31/2013   Pneumococcal-Unspecified 09/22/2008   Rsv, Bivalent, Protein Subunit Rsvpref,pf (Evans Lance 02/07/2022   Td 06/24/2009   Zoster Recombinat (Shingrix) 12/04/2016   Zoster, Live 09/09/2007    TDAP status: Due, Education has been provided regarding the importance of this vaccine. Advised may receive this vaccine at local pharmacy or Health Dept. Aware to provide a copy of the vaccination record if obtained from local pharmacy or Health Dept. Verbalized acceptance and understanding.  Flu Vaccine status: Due, Education has been provided regarding the importance of this vaccine. Advised may receive this vaccine at local pharmacy or Health Dept. Aware to provide a copy of the vaccination  record if obtained from local pharmacy or Health Dept. Verbalized acceptance and understanding.  Pneumococcal vaccine status: Up to date  Covid-19 vaccine status: Completed vaccines  Qualifies for Shingles Vaccine? Yes   Zostavax completed Yes   Shingrix Completed?: Yes  Screening Tests Health Maintenance  Topic Date Due   Pneumonia Vaccine 81 Years old (2 - PPSV23 or PCV20) 05/31/2014   INFLUENZA VACCINE  01/22/2022   Diabetic kidney evaluation - GFR measurement  03/30/2022   Diabetic kidney evaluation - Urine ACR  03/30/2022   FOOT EXAM  04/10/2022   COVID-19 Vaccine (7 - Pfizer risk series) 05/10/2022   HEMOGLOBIN A1C  06/08/2022   OPHTHALMOLOGY EXAM  09/05/2022   DEXA SCAN  Completed   HPV VACCINES  Aged Out   TETANUS/TDAP  Discontinued   Zoster Vaccines- Shingrix  Discontinued    Health Maintenance  Health Maintenance Due  Topic Date Due   Pneumonia Vaccine 81 Years old (2 - PPSV23 or PCV20) 05/31/2014   INFLUENZA VACCINE  01/22/2022   Diabetic kidney evaluation - GFR measurement  03/30/2022   Diabetic kidney evaluation - Urine ACR  03/30/2022    Colorectal cancer screening: No longer required.   Mammogram status: No longer required due to age.  Bone Density status: Ordered 03/29/2022. Pt provided with contact info and advised to  call to schedule appt.  Lung Cancer Screening: (Low Dose CT Chest recommended if Age 24-80 years, 30 pack-year currently smoking OR have quit w/in 15years.) does not qualify.   Lung Cancer Screening Referral: n/a  Additional Screening:  Hepatitis C Screening: does not qualify;   Vision Screening: Recommended annual ophthalmology exams for early detection of glaucoma and other disorders of the eye. Is the patient up to date with their annual eye exam?  Yes  Who is the provider or what is the name of the office in which the patient attends annual eye exams? Dr.Groat  If pt is not established with a provider, would they like to be  referred to a provider to establish care? No .   Dental Screening: Recommended annual dental exams for proper oral hygiene  Community Resource Referral / Chronic Care Management: CRR required this visit?  No   CCM required this visit?  No      Plan:     I have personally reviewed and noted the following in the patient's chart:   Medical and social history Use of alcohol, tobacco or illicit drugs  Current medications and supplements including opioid prescriptions. Patient is not currently taking opioid prescriptions. Functional ability and status Nutritional status Physical activity Advanced directives List of other physicians Hospitalizations, surgeries, and ER visits in previous 12 months Vitals Screenings to include cognitive, depression, and falls Referrals and appointments  In addition, I have reviewed and discussed with patient certain preventive protocols, quality metrics, and best practice recommendations. A written personalized care plan for preventive services as well as general preventive health recommendations were provided to patient.     [name], LPN   [id number]   Nurse Notes: Due Tdap/Flu Vaccine

## 2022-03-29 NOTE — Patient Instructions (Signed)
Erica [name] , Thank you for taking time to come for your Medicare Wellness Visit. I appreciate your ongoing commitment to your health goals. Please review the following plan we discussed and let me know if I can assist you in the future.   These are the goals we discussed:  Goals       Exercise 150 minutes per week (moderate activity)      Will exercise more      Patient Stated      Stay as healthy and as independent as possible. Enjoy life, family and friends.      Patient Stated      Patient Stated (pt-stated)      My goal would be to lose at least 10 pounds.        This is a list of the screening recommended for you and due dates:  Health Maintenance  Topic Date Due   Pneumonia Vaccine (2 - PPSV23 or PCV20) 05/31/2014   Flu Shot  01/22/2022   Yearly kidney function blood test for diabetes  03/30/2022   Yearly kidney health urinalysis for diabetes  03/30/2022   Complete foot exam   04/10/2022   COVID-19 Vaccine (7 - Pfizer risk series) 05/10/2022   Hemoglobin A1C  06/08/2022   Eye exam for diabetics  09/05/2022   DEXA scan (bone density measurement)  Completed   HPV Vaccine  Aged Out   Tetanus Vaccine  Discontinued   Zoster (Shingles) Vaccine  Discontinued    Advanced directives: Please bring a copy of your health care power of attorney and living will to the office to be added to your chart at your convenience.   Conditions/risks identified: Aim for 30 minutes of exercise or brisk walking, 6-8 glasses of water, and 5 servings of fruits and vegetables each day.   Next appointment: Follow up in one year for your annual wellness visit    Preventive Care 65 Years and Older, Female Preventive care refers to lifestyle choices and visits with your health care provider that can promote health and wellness. What does preventive care include? A yearly physical exam. This is also called an annual well check. Dental exams once or twice a year. Routine eye exams. Ask your health  care provider how often you should have your eyes checked. Personal lifestyle choices, including: Daily care of your teeth and gums. Regular physical activity. Eating a healthy diet. Avoiding tobacco and drug use. Limiting alcohol use. Practicing safe sex. Taking low-dose aspirin every day. Taking vitamin and mineral supplements as recommended by your health care provider. What happens during an annual well check? The services and screenings done by your health care provider during your annual well check will depend on your age, overall health, lifestyle risk factors, and family history of disease. Counseling  Your health care provider may ask you questions about your: Alcohol use. Tobacco use. Drug use. Emotional well-being. Home and relationship well-being. Sexual activity. Eating habits. History of falls. Memory and ability to understand (cognition). Work and work Statistician. Reproductive health. Screening  You may have the following tests or measurements: Height, weight, and BMI. Blood pressure. Lipid and cholesterol levels. These may be checked every 5 years, or more frequently if you are over 44 years old. Skin check. Lung cancer screening. You may have this screening every year starting at age 71 if you have a 30-pack-year history of smoking and currently smoke or have quit within the past 15 years. Fecal occult blood test (FOBT) of the  stool. You may have this test every year starting at age 28. Flexible sigmoidoscopy or colonoscopy. You may have a sigmoidoscopy every 5 years or a colonoscopy every 10 years starting at age 42. Hepatitis C blood test. Hepatitis B blood test. Sexually transmitted disease (STD) testing. Diabetes screening. This is done by checking your blood sugar (glucose) after you have not eaten for a while (fasting). You may have this done every 1-3 years. Bone density scan. This is done to screen for osteoporosis. You may have this done starting at  age 104. Mammogram. This may be done every 1-2 years. Talk to your health care provider about how often you should have regular mammograms. Talk with your health care provider about your test results, treatment options, and if necessary, the need for more tests. Vaccines  Your health care provider may recommend certain vaccines, such as: Influenza vaccine. This is recommended every year. Tetanus, diphtheria, and acellular pertussis (Tdap, Td) vaccine. You may need a Td booster every 10 years. Zoster vaccine. You may need this after age 57. Pneumococcal 13-valent conjugate (PCV13) vaccine. One dose is recommended after age 62. Pneumococcal polysaccharide (PPSV23) vaccine. One dose is recommended after age 34. Talk to your health care provider about which screenings and vaccines you need and how often you need them. This information is not intended to replace advice given to you by your health care provider. Make sure you discuss any questions you have with your health care provider. Document Released: 07/07/2015 Document Revised: 02/28/2016 Document Reviewed: 04/11/2015 Elsevier Interactive Patient Education  2017 [place] Prevention in the Home Falls can cause injuries. They can happen to people of all ages. There are many things you can do to make your home safe and to help prevent falls. What can I do on the outside of my home? Regularly fix the edges of walkways and driveways and fix any cracks. Remove anything that might make you trip as you walk through a door, such as a raised step or threshold. Trim any bushes or trees on the path to your home. Use bright outdoor lighting. Clear any walking paths of anything that might make someone trip, such as rocks or tools. Regularly check to see if handrails are loose or broken. Make sure that both sides of any steps have handrails. Any raised decks and porches should have guardrails on the edges. Have any leaves, snow, or ice cleared  regularly. Use sand or salt on walking paths during winter. Clean up any spills in your garage right away. This includes oil or grease spills. What can I do in the bathroom? Use night lights. Install grab bars by the toilet and in the tub and shower. Do not use towel bars as grab bars. Use non-skid mats or decals in the tub or shower. If you need to sit down in the shower, use a plastic, non-slip stool. Keep the floor dry. Clean up any water that spills on the floor as soon as it happens. Remove soap buildup in the tub or shower regularly. Attach bath mats securely with double-sided non-slip rug tape. Do not have throw rugs and other things on the floor that can make you trip. What can I do in the bedroom? Use night lights. Make sure that you have a light by your bed that is easy to reach. Do not use any sheets or blankets that are too big for your bed. They should not hang down onto the floor. Have a firm chair that  has side arms. You can use this for support while you get dressed. Do not have throw rugs and other things on the floor that can make you trip. What can I do in the kitchen? Clean up any spills right away. Avoid walking on wet floors. Keep items that you use a lot in easy-to-reach places. If you need to reach something above you, use a strong step stool that has a grab bar. Keep electrical cords out of the way. Do not use floor polish or wax that makes floors slippery. If you must use wax, use non-skid floor wax. Do not have throw rugs and other things on the floor that can make you trip. What can I do with my stairs? Do not leave any items on the stairs. Make sure that there are handrails on both sides of the stairs and use them. Fix handrails that are broken or loose. Make sure that handrails are as long as the stairways. Check any carpeting to make sure that it is firmly attached to the stairs. Fix any carpet that is loose or worn. Avoid having throw rugs at the top or  bottom of the stairs. If you do have throw rugs, attach them to the floor with carpet tape. Make sure that you have a light switch at the top of the stairs and the bottom of the stairs. If you do not have them, ask someone to add them for you. What else can I do to help prevent falls? Wear shoes that: Do not have high heels. Have rubber bottoms. Are comfortable and fit you well. Are closed at the toe. Do not wear sandals. If you use a stepladder: Make sure that it is fully opened. Do not climb a closed stepladder. Make sure that both sides of the stepladder are locked into place. Ask someone to hold it for you, if possible. Clearly mark and make sure that you can see: Any grab bars or handrails. First and last steps. Where the edge of each step is. Use tools that help you move around (mobility aids) if they are needed. These include: Canes. Walkers. Scooters. Crutches. Turn on the lights when you go into a dark area. Replace any light bulbs as soon as they burn out. Set up your furniture so you have a clear path. Avoid moving your furniture around. If any of your floors are uneven, fix them. If there are any pets around you, be aware of where they are. Review your medicines with your doctor. Some medicines can make you feel dizzy. This can increase your chance of falling. Ask your doctor what other things that you can do to help prevent falls. This information is not intended to replace advice given to you by your health care provider. Make sure you discuss any questions you have with your health care provider. Document Released: 04/06/2009 Document Revised: 11/16/2015 Document Reviewed: 07/15/2014 Elsevier Interactive Patient Education  2017 [employer].

## 2022-04-01 ENCOUNTER — Encounter: Payer: Self-pay | Admitting: Nurse Practitioner

## 2022-04-01 ENCOUNTER — Ambulatory Visit: Payer: Medicare PPO | Admitting: Internal Medicine

## 2022-04-01 ENCOUNTER — Encounter: Payer: Self-pay | Admitting: Internal Medicine

## 2022-04-01 VITALS — BP 120/72 | HR 74 | Temp 97.6°F | Ht 64.0 in | Wt 140.0 lb

## 2022-04-01 DIAGNOSIS — E119 Type 2 diabetes mellitus without complications: Secondary | ICD-10-CM

## 2022-04-01 DIAGNOSIS — K449 Diaphragmatic hernia without obstruction or gangrene: Secondary | ICD-10-CM

## 2022-04-01 DIAGNOSIS — R1319 Other dysphagia: Secondary | ICD-10-CM | POA: Diagnosis not present

## 2022-04-01 DIAGNOSIS — R0602 Shortness of breath: Secondary | ICD-10-CM

## 2022-04-01 DIAGNOSIS — E785 Hyperlipidemia, unspecified: Secondary | ICD-10-CM

## 2022-04-01 DIAGNOSIS — E1169 Type 2 diabetes mellitus with other specified complication: Secondary | ICD-10-CM | POA: Diagnosis not present

## 2022-04-01 LAB — COMPREHENSIVE METABOLIC PANEL
ALT: 17 U/L (ref 0–35)
AST: 16 U/L (ref 0–37)
Albumin: 4.2 g/dL (ref 3.5–5.2)
Alkaline Phosphatase: 56 U/L (ref 39–117)
BUN: 15 mg/dL (ref 6–23)
CO2: 26 mEq/L (ref 19–32)
Calcium: 9.1 mg/dL (ref 8.4–10.5)
Chloride: 107 mEq/L (ref 96–112)
Creatinine, Ser: 0.8 mg/dL (ref 0.40–1.20)
GFR: 68.03 mL/min (ref >60.00)
Glucose, Bld: 102 mg/dL — ABNORMAL HIGH (ref 70–99)
Potassium: 4.4 mEq/L (ref 3.5–5.1)
Sodium: 140 mEq/L (ref 135–145)
Total Bilirubin: 0.4 mg/dL (ref 0.2–1.2)
Total Protein: 6.8 g/dL (ref 6.0–8.3)

## 2022-04-01 LAB — CBC
HCT: 41 % (ref 36.0–46.0)
Hemoglobin: 13.7 g/dL (ref 12.0–15.0)
MCHC: 33.3 g/dL (ref 30.0–36.0)
MCV: 91.3 fl (ref 78.0–100.0)
Platelets: 227 10*3/uL (ref 150.0–400.0)
RBC: 4.49 Mil/uL (ref 3.87–5.11)
RDW: 13.3 % (ref 11.5–15.5)
WBC: 6.4 10*3/uL (ref 4.0–10.5)

## 2022-04-01 LAB — MICROALBUMIN / CREATININE URINE RATIO
Creatinine,U: 114.4 mg/dL
Microalb Creat Ratio: 0.6 mg/g (ref 0.0–30.0)
Microalb, Ur: <0.7 mg/dL (ref 0.0–1.9)

## 2022-04-01 LAB — CK: Total CK: 120 U/L (ref 7–177)

## 2022-04-01 LAB — HEMOGLOBIN A1C: Hgb A1c MFr Bld: 6.5 % (ref 4.6–6.5)

## 2022-04-01 LAB — BRAIN NATRIURETIC PEPTIDE: Pro B Natriuretic peptide (BNP): 92 pg/mL (ref 0.0–100.0)

## 2022-04-01 NOTE — Patient Instructions (Signed)
We will do the labs today and get you in with GI to check out the hiatal hernia.

## 2022-04-01 NOTE — Assessment & Plan Note (Signed)
Is worsening slightly. Still mostly with solids. Does not wish to take PPI regularly. Referral to GI and likely will need EGD with possible dilation.

## 2022-04-01 NOTE — Assessment & Plan Note (Signed)
Small hiatal hernia noted on CT 2022 and she is having worsening symptoms lately with problems swallowing, nausea, epigastric pain, SOB after eating, atypical chest pain with eating. She was advised to try PPI for 2-4 weeks but she did not want to do this unless needed. Agreed to referral to GI for evaluation.

## 2022-04-01 NOTE — Assessment & Plan Note (Signed)
Checking CBC, CMP, BNP to rule out causes. Prior echo 2014 normal.

## 2022-04-01 NOTE — Progress Notes (Signed)
   Subjective:   Patient ID: Erica [name], female    DOB: 1941-01-05, 81 y.o.   MRN: [id number]  HPI The patient is an 81 YO female coming in for SOB and chest tightness with eating and with lying down. Prior dysphagia to solids which is worsening with time. Not taking PPI and does not related reflux symptoms lately.   Review of Systems  Constitutional:  Positive for appetite change.  HENT:  Positive for trouble swallowing.   Eyes: Negative.   Respiratory:  Positive for shortness of breath. Negative for cough and chest tightness.   Cardiovascular:  Negative for chest pain, palpitations and leg swelling.  Gastrointestinal:  Positive for abdominal pain. Negative for abdominal distention, constipation, diarrhea, nausea and vomiting.  Musculoskeletal: Negative.   Skin: Negative.   Neurological: Negative.   Psychiatric/Behavioral: Negative.      Objective:  Physical Exam Constitutional:      Appearance: She is well-developed.  HENT:     Head: Normocephalic and atraumatic.  Cardiovascular:     Rate and Rhythm: Normal rate and regular rhythm.  Pulmonary:     Effort: Pulmonary effort is normal. No respiratory distress.     Breath sounds: Normal breath sounds. No wheezing or rales.  Abdominal:     General: Bowel sounds are normal. There is no distension.     Palpations: Abdomen is soft.     Tenderness: There is abdominal tenderness. There is no rebound.     Comments: Epigastric tenderness to palpation  Musculoskeletal:     Cervical back: Normal range of motion.  Skin:    General: Skin is warm and dry.  Neurological:     Mental Status: She is alert and oriented to person, place, and time.     Coordination: Coordination normal.     Vitals:   04/01/22 1047  BP: 120/72  Pulse: 74  Temp: 97.6 F (36.4 C)  SpO2: 98%  Weight: 140 lb (63.5 kg)  Height: '5\' 4"'$  (1.626 m)    Assessment & Plan:

## 2022-04-01 NOTE — Assessment & Plan Note (Signed)
Checking HgA1c and foot exam and microalbumin to creatinine ratio. She was previously in pre-diabetes range off meds. Is taking statin. Adjust as needed.

## 2022-04-01 NOTE — Assessment & Plan Note (Signed)
Checking lipid panel and CK for cardiology and will forward. They did recently switch to crestor 5 mg daily.

## 2022-04-02 ENCOUNTER — Other Ambulatory Visit: Payer: Self-pay | Admitting: Internal Medicine

## 2022-04-02 DIAGNOSIS — R0602 Shortness of breath: Secondary | ICD-10-CM

## 2022-04-02 LAB — NMR, LIPOPROFILE
Cholesterol, Total: 113 mg/dL (ref 100–199)
HDL Particle Number: 35.6 umol/L (ref >=30.5)
HDL-C: 48 mg/dL (ref >39)
LDL Particle Number: 582 nmol/L (ref <1000)
LDL Size: 19.7 nm — ABNORMAL LOW (ref >20.5)
LDL-C (NIH Calc): 48 mg/dL (ref 0–99)
LP-IR Score: 51 — ABNORMAL HIGH (ref <=45)
Small LDL Particle Number: 431 nmol/L (ref <=527)
Triglycerides: 87 mg/dL (ref 0–149)

## 2022-04-14 ENCOUNTER — Other Ambulatory Visit: Payer: Self-pay | Admitting: Internal Medicine

## 2022-04-20 ENCOUNTER — Encounter: Payer: Self-pay | Admitting: Internal Medicine

## 2022-04-22 ENCOUNTER — Other Ambulatory Visit (HOSPITAL_COMMUNITY): Payer: Medicare PPO

## 2022-04-24 ENCOUNTER — Other Ambulatory Visit: Payer: Medicare PPO

## 2022-04-25 ENCOUNTER — Ambulatory Visit (HOSPITAL_COMMUNITY): Payer: Medicare PPO | Attending: Internal Medicine

## 2022-04-25 DIAGNOSIS — R0602 Shortness of breath: Secondary | ICD-10-CM | POA: Insufficient documentation

## 2022-04-25 LAB — ECHOCARDIOGRAM COMPLETE
Area-P 1/2: 3.66 cm2
S' Lateral: 2.4 cm

## 2022-04-25 MED ORDER — PERFLUTREN LIPID MICROSPHERE
1.0000 mL | INTRAVENOUS | Status: AC | PRN
  Administered 2022-04-25: 1 mL via INTRAVENOUS

## 2022-05-02 ENCOUNTER — Encounter: Payer: Self-pay | Admitting: Nurse Practitioner

## 2022-05-02 ENCOUNTER — Ambulatory Visit: Payer: Medicare PPO | Admitting: Nurse Practitioner

## 2022-05-02 VITALS — BP 90/60 | HR 66 | Ht 64.0 in | Wt 140.1 lb

## 2022-05-02 DIAGNOSIS — R1310 Dysphagia, unspecified: Secondary | ICD-10-CM | POA: Diagnosis not present

## 2022-05-02 NOTE — Progress Notes (Unsigned)
05/02/2022 Erica [name] [id number] 11/11/1940   CHIEF COMPLAINT: Hiatal hernia, difficulty swallowing   HISTORY OF PRESENT ILLNESS:  Erica [name] is an 81 year old female with a past medical history of arthritis, asthma, hyperlipidemia, past DM II (patient stated she does not have diabetes), macular degeneration and colon polyps. She presents to our office today as referred by Dr. Pricilla Holm for further evaluation regarding epigastric pain, SOB which occurs after she eats with a history of a small hiatal hernia on CT. She describes having intermittent SOB which occurs 15 to 20 minutes after eating x 1 year. No specific food triggers. She has heartburn if she over eats. She has dysphagia for the past 3 to 4 years. She describes food and sometimes pills slowly passes down the esophagus and sometimes briefly gets stuck. She tries to stretch her neck to facilitate swallowing. If she drinks water, sometimes the stuck food passes down and sometimes the water comes right back out. She takes TUMS every once in a while if she eats close to bed time. She sometimes has chest tightness when eating or when laying down.   She reported undergoing a past through cardiac evaluation by Dr. Harrington Challenger due to having SOB, chest pain  and palpitations. She underwent a negative myoview in 2017 and in 2019. She underwent a coronary CT 03/2021 which showed a calcium  score of 181 treated with ASA and Crestor. She underwent a zio patch study due to having palpitations 03/06/2022 which showed a sinus rhythm, average HR 79, with rare PAC and PVC. No arrhythmias detected. Palpitations occurred after she received RSV vaccination. ECHO 04/25/2022 showed a LV EF of 60 -65%. She was prescribed PPI by her PCP but she did not wish to take it at that time. No CP or SOB at this time.   She is passing a normal formed brown stool daily No rectal bleeding or black stools. Her most recent colonoscopy was 12/2011 and 2 tubular  adenomatous polyps were removed from the colon and diverticulosis. Mother with history of colon cancer diagnosed at the age of 75.      Latest Ref Rng & Units 04/01/2022   11:12 AM 03/30/2021    8:53 AM 08/17/2020    6:50 PM  CBC  WBC 4.0 - 10.5 K/uL 6.4  6.3  7.7   Hemoglobin 12.0 - 15.0 g/dL 13.7  13.9  13.4   Hematocrit 36.0 - 46.0 % 41.0  42.1  40.4   Platelets 150.0 - 400.0 K/uL 227.0  244.0  253        Latest Ref Rng & Units 04/01/2022   11:12 AM 10/05/2021    9:02 AM 03/30/2021    8:53 AM  CMP  Glucose 70 - 99 mg/dL 102   99   BUN 6 - 23 mg/dL 15   19   Creatinine 0.40 - 1.20 mg/dL 0.80   0.86   Sodium 135 - 145 mEq/L 140   139   Potassium 3.5 - 5.1 mEq/L 4.4   4.0   Chloride 96 - 112 mEq/L 107   106   CO2 19 - 32 mEq/L 26   25   Calcium 8.4 - 10.5 mg/dL 9.1   9.0   Total Protein 6.0 - 8.3 g/dL 6.8   6.5   Total Bilirubin 0.2 - 1.2 mg/dL 0.4   0.5   Alkaline Phos 39 - 117 U/L 56   58   AST 0 - 37 U/L 16  18  16   ALT 0 - 35 U/L 17   14    IMAGE STUDIES: Cardiac CT 04/24/2021: Coronary calcium score of 181. This was 59 percentile for age-, race-, and sex-matched controls.  Aortic atherosclerosis  OVER-READ INTERPRETATION  CT CHEST 04/24/2021   The following report is an over-read performed by radiologist Dr. Rolm Baptise of [facility], [place] on 04/17/2021. This over-read does not include interpretation of cardiac or coronary anatomy or pathology. The coronary calcium score interpretation by the cardiologist is attached.   FINDINGS: Vascular: Heart is normal size. Aorta normal caliber. Scattered calcifications in the aortic root.   Mediastinum/Nodes: No adenopathy   Lungs/Pleura: No confluent opacities or effusions.   Upper Abdomen: Imaging into the upper abdomen demonstrates no acute findings.   Musculoskeletal: Chest wall soft tissues are unremarkable. No acute bony abnormality.   IMPRESSION: No acute extra cardiac abnormality.   Scattered  calcifications in the aortic root.  CT renal stone study 08/17/2020: IMPRESSION: 1. 4 mm distal left ureteral calculus with mild, stable thickening of the distal left ureter. 2. Mild right-sided hydronephrosis and hydroureter without evidence of obstructing renal calculi. 3. 2 mm nonobstructing renal stone within the left kidney. 4. Colonic diverticulosis. 5. Small hiatal hernia. 6. Mild aortic atherosclerosis.  Aortic Atherosclerosis  ECHO 04/25/2022: IMPRESSIONS Left ventricular ejection fraction, by estimation, is 60 to 65%. The left ventricle has normal function. The left ventricle has no regional wall motion abnormalities. Left ventricular diastolic parameters were normal. 1. 2. Right ventricular systolic function is normal. The right ventricular size is normal. 3. Left atrial size was mildly dilated. 4. Right atrial size was mildly dilated. The mitral valve is normal in structure. Trivial mitral valve regurgitation. No evidence of mitral stenosis. 5. The aortic valve is tricuspid. There is mild calcification of the aortic valve. Aortic valve regurgitation is trivial. Aortic valve sclerosis/calcification is present, without any evidence of aortic stenosis. 6. The inferior vena cava is normal in size with greater than 50% respiratory variability, suggesting right atrial pressure of 3 mmHg.  PAST GI PROCEDURES:  Colonoscopy 01/14/2012: 1) Two polyps 2) Mild diverticulosis  3) Otherwise normal examination, s/p random biopsies to r/o microscopic colitis  1. Surgical [P], cecum, ascending, polyps - TUBULAR ADENOMAS (MULTIPLE FRAGMENTS). - HIGH GRADE DYSPLASIA IS NOT IDENTIFIED. 2. Surgical [P], random colon - BENIGN COLONIC MUCOSA. - NO SIGNIFICANT INFLAMMATION OR OTHER ABNORMALITIES IDENTIFIED.  Colonoscopy in 2005 and 1997 showed mild diverticulosis of the left colon.   Past Medical History:  Diagnosis Date   Allergic rhinitis    Arthritis    Asthma     Diet-controlled type 2 diabetes mellitus ([place]) 09/2011 dx   Diverticulosis    Dyslipidemia    Macular degeneration    genetic etiology, not age-related per retinal specailist   Recurrent kidney stones    Past Surgical History:  Procedure Laterality Date   CATARACT EXTRACTION Bilateral 01/2015   L on 8/8, R on 8/22   DILATION AND CURETTAGE OF UTERUS     TONSILLECTOMY AND ADENOIDECTOMY     Social History: She is married. Retired. She has one son and one daughter. She drinks 1/2 glass wine every few weeks or less. Nonsmoker. No drug use.   Family History: Mother had salivary gland cancer age 23, colon cancer age 1. Father with history of hypertension, heart disease, heavy smoker, died age 62 secondary to PE. Brother with DM.   Allergies  Allergen Reactions   Augmentin [Amoxicillin-Pot Clavulanate] Nausea  And Vomiting   Other     Phobia of steroids   Scallops [Shellfish Allergy] Nausea And Vomiting    Scallops only      Outpatient Encounter Medications as of 05/02/2022  Medication Sig   aspirin EC 81 MG tablet Take 1 tablet (81 mg total) by mouth daily. Swallow whole.   cetirizine (ZYRTEC) 10 MG tablet Take 10 mg by mouth daily as needed for allergies.   cyanocobalamin (VITAMIN B12) 1000 MCG/ML injection INJECT 1 ML (1,000 MCG TOTAL) INTO THE MUSCLE EVERY 21 ( TWENTY-ONE) DAYS.   gabapentin (NEURONTIN) 100 MG capsule Take 1-2 capsules (100-200 mg total) by mouth at bedtime.   Multiple Vitamins-Minerals (PRESERVISION AREDS PO) Take 1 tablet by mouth daily.   rosuvastatin (CRESTOR) 5 MG tablet Take 1 tablet (5 mg total) by mouth daily.   SYRINGE-NEEDLE, DISP, 3 ML (BD SAFETYGLIDE SYRINGE/NEEDLE) 25G X 1" 3 ML MISC Used to inject B12   No facility-administered encounter medications on file as of 05/02/2022.    REVIEW OF SYSTEMS:  Gen: Denies fever, sweats or chills. No weight loss.  CV: See HPI.  Resp: See HPI. No hemoptysis.  GI: See HPI. GU : Denies urinary burning, blood in  urine, increased urinary frequency or incontinence. MS: + Arthritis.  Derm: Denies rash, itchiness, skin lesions or unhealing ulcers. Psych: Denies depression, anxiety or memory loss.  Heme: Denies bruising, easy bleeding. Neuro:  Denies headaches, dizziness or paresthesias. Endo:  Denies any problems with DM, thyroid or adrenal function.  PHYSICAL EXAM: BP 90/60   Pulse 66   Ht '5\' 4"'$  (1.626 m)   Wt 140 lb 2 oz (63.6 kg)   BMI 24.05 kg/m  General: 81 year old female in NAD.  Head: Normocephalic and atraumatic. Eyes:  Sclerae non-icteric, conjunctive pink. Ears: Normal auditory acuity. Mouth: Dentition intact. No ulcers or lesions.  Neck: Supple, no lymphadenopathy or thyromegaly.  Lungs: Clear bilaterally to auscultation without wheezes, crackles or rhonchi. Heart: Regular rate and rhythm. No murmur, rub or gallop appreciated.  Abdomen: Soft, nontender, non distended. No masses. No hepatosplenomegaly. Normoactive bowel sounds x 4 quadrants.  Rectal: Deferred.  Musculoskeletal: Symmetrical with no gross deformities. Skin: Warm and dry. No rash or lesions on visible extremities. Extremities: No edema. Neurological: Alert oriented x 4, no focal deficits.  Psychological:  Alert and cooperative. Normal mood and affect.  ASSESSMENT AND PLAN:  63) 81 year old female with a small hiatal hernia per CT 07/2020, GERD symptoms, dysphagia and atypical chest pain with associated SOB which occurs after eating.  -EGD benefits and risks discussed including risk with sedation, risk of bleeding, perforation and infection  -I will request cardiac clearance from Dr. Harrington Challenger prior to the patient proceeding with an EGD  -Omeprazole '20mg'$  once daily OTC -Patient to contact office if symptoms worsen  2) CAD, mild elevation in Ca score (56th percentile) on ASA and Crestor. Negative myoview in 2017 and 2019. Zio patch 03/06/2022 showed a sinus rhythm, average HR 79, with rare PAC and PVC. No arrhythmias  detected. Palpitations occurred after she received RSV vaccination. ECHO 04/25/2022 showed a LV EF of 60 -65%.   3) History of 2 tubular adenomatous polyp removed from the colon per colonoscopy 12/2011. Mother diagnosed with colon cancer at the age of 30.  -No further colon polyp surveillance colonoscopies recommended as she is 81 years old   ADDENDUM: Cardiac clearance from Dr. Harrington Challenger received 06/07/2022.  Patient to proceed with EGD as planned.  CC:  Hoyt Koch, *

## 2022-05-02 NOTE — Patient Instructions (Signed)
You have been scheduled for an endoscopy. Please follow written instructions given to you at your visit today. If you use inhalers (even only as needed), please bring them with you on the day of your procedure.   Contact our office if your symptoms worsen.  Take Omeprazole over the counter. Take 1 tablet by mouth daily.  Due to recent changes in healthcare laws, you may see the results of your imaging and laboratory studies on MyChart before your provider has had a chance to review them.  We understand that in some cases there may be results that are confusing or concerning to you. Not all laboratory results come back in the same time frame and the provider may be waiting for multiple results in order to interpret others.  Please give [place] 48 hours in order for your provider to thoroughly review all the results before contacting the office for clarification of your results.    It was a pleasure to see you today!  Thank you for trusting me with your gastrointestinal care!

## 2022-05-06 ENCOUNTER — Telehealth: Payer: Self-pay

## 2022-05-06 NOTE — Progress Notes (Signed)
I saw the pt this fall   Doing well at that time. From a cardiac standpoint I think she is at low risk and OK to proceed with endoscopy

## 2022-05-06 NOTE — Telephone Encounter (Unsigned)
[facility] Pre-operative Risk Assessment     Erica [name] 1940-08-03 [id number]  Procedure: Upper Endoscopy Anesthesia type:  MAC Procedure Date: 05/31/2022  Provider: Lyndel Safe  Type of Clearance needed: Cardiac  Medication(s) needing held: N/A  Length of time for medication to be held: N/A  Please review request and advise by either responding to this message or by sending your response to the fax # provided below.  Thank you,  [facility]  Phone: [phone] Fax: [phone] ATTENTION: Remo Lipps RN

## 2022-05-08 NOTE — Telephone Encounter (Signed)
Spoke with Pt husband Berneta Sages.  Berneta Sages notified that our office received a message from [facility]  Cardiologist stating that Meganne was OK to proceed with her procedure ( EGD) Berneta Sages verbalized understanding with all questions answered.

## 2022-05-08 NOTE — Telephone Encounter (Signed)
Patient seen in clinic   Minimal CAD    Low risk   OK to proceed with procedure

## 2022-05-12 NOTE — Progress Notes (Signed)
Agree with assessment/plan.  Raj Judah Chevere, MD [facility] [phone]  

## 2022-05-20 ENCOUNTER — Encounter: Payer: Self-pay | Admitting: Internal Medicine

## 2022-05-31 ENCOUNTER — Encounter: Payer: Medicare PPO | Admitting: Gastroenterology

## 2022-06-14 ENCOUNTER — Other Ambulatory Visit: Payer: Self-pay | Admitting: Internal Medicine

## 2022-09-11 DIAGNOSIS — H26492 Other secondary cataract, left eye: Secondary | ICD-10-CM | POA: Diagnosis not present

## 2022-09-11 DIAGNOSIS — H3554 Dystrophies primarily involving the retinal pigment epithelium: Secondary | ICD-10-CM | POA: Diagnosis not present

## 2022-09-11 DIAGNOSIS — H40013 Open angle with borderline findings, low risk, bilateral: Secondary | ICD-10-CM | POA: Diagnosis not present

## 2022-09-11 DIAGNOSIS — Z961 Presence of intraocular lens: Secondary | ICD-10-CM | POA: Diagnosis not present

## 2022-09-11 DIAGNOSIS — H02052 Trichiasis without entropian right lower eyelid: Secondary | ICD-10-CM | POA: Diagnosis not present

## 2022-09-11 LAB — HM DIABETES EYE EXAM

## 2022-09-13 ENCOUNTER — Encounter (HOSPITAL_BASED_OUTPATIENT_CLINIC_OR_DEPARTMENT_OTHER): Payer: Self-pay

## 2022-09-13 ENCOUNTER — Other Ambulatory Visit: Payer: Self-pay

## 2022-09-13 ENCOUNTER — Emergency Department (HOSPITAL_BASED_OUTPATIENT_CLINIC_OR_DEPARTMENT_OTHER)
Admission: EM | Admit: 2022-09-13 | Discharge: 2022-09-13 | Disposition: A | Discharge status: Home / Self Care | Payer: Medicare PPO | Attending: Emergency Medicine | Admitting: Emergency Medicine

## 2022-09-13 DIAGNOSIS — S46812A Strain of other muscles, fascia and tendons at shoulder and upper arm level, left arm, initial encounter: Secondary | ICD-10-CM | POA: Diagnosis not present

## 2022-09-13 DIAGNOSIS — S46912A Strain of unspecified muscle, fascia and tendon at shoulder and upper arm level, left arm, initial encounter: Secondary | ICD-10-CM | POA: Diagnosis not present

## 2022-09-13 DIAGNOSIS — M25512 Pain in left shoulder: Secondary | ICD-10-CM

## 2022-09-13 DIAGNOSIS — X500XXA Overexertion from strenuous movement or load, initial encounter: Secondary | ICD-10-CM | POA: Insufficient documentation

## 2022-09-13 DIAGNOSIS — R9431 Abnormal electrocardiogram [ECG] [EKG]: Secondary | ICD-10-CM | POA: Diagnosis not present

## 2022-09-13 DIAGNOSIS — E119 Type 2 diabetes mellitus without complications: Secondary | ICD-10-CM | POA: Insufficient documentation

## 2022-09-13 DIAGNOSIS — Z7982 Long term (current) use of aspirin: Secondary | ICD-10-CM | POA: Insufficient documentation

## 2022-09-13 DIAGNOSIS — S4992XA Unspecified injury of left shoulder and upper arm, initial encounter: Secondary | ICD-10-CM | POA: Diagnosis present

## 2022-09-13 DIAGNOSIS — M549 Dorsalgia, unspecified: Secondary | ICD-10-CM | POA: Diagnosis not present

## 2022-09-13 MED ORDER — BACLOFEN 10 MG PO TABS: 10.0000 mg | ORAL_TABLET | Freq: Three times a day (TID) | ORAL | 0 refills | Status: DC | PRN

## 2022-09-13 MED ORDER — IBUPROFEN 600 MG PO TABS: 600.0000 mg | ORAL_TABLET | Freq: Four times a day (QID) | ORAL | 0 refills | Status: AC | PRN

## 2022-09-13 NOTE — Discharge Instructions (Addendum)
I believe you most likely have a strained muscle.  I am prescribing ibuprofen for you to use every 6 hours, take this with food.  You can use the muscle relaxer every 8 hours as well.  I would encourage you to use a heating pad. You can buy over the counter Voltaren gel as well (just don't place heat directly on top of this as it can increase the absorption).   Please return for chest pain, shortness of breath, N/V, worsening pain.   Keep your appointment with your PCP on Monday.

## 2022-09-13 NOTE — ED Notes (Signed)
Pt verbalized understanding of d/c instructions, meds, and followup care. Denies questions. VSS, no distress noted. Steady gait to exit with all belongings.  ?

## 2022-09-13 NOTE — ED Provider Notes (Signed)
[facility] Provider Note   CSN: [id number] Arrival date & time: 09/13/22  1545     History  Chief Complaint  Patient presents with   Shoulder Pain    Erica [name] is a 82 y.o. female with PMHx of OA, T2DM, HLD who presents for evaluation of acute onset of left shoulder pain.   Patient presents for acute onset left shoulder and left upper back pain since this morning.  She has been moving all week but denies any specific trauma or injury to her shoulder.  She denies any chest pain, shortness of breath, nausea vomiting.  She tells me she has no cardiac history.  She did try Advil for the pain which did not seem to help much.  She has no previous injuries to this left shoulder.       Home Medications Prior to Admission medications   Medication Sig Start Date End Date Taking? Authorizing Provider  baclofen (LIORESAL) 10 MG tablet Take 1 tablet (10 mg total) by mouth 3 (three) times daily as needed for muscle spasms. 09/13/22  Yes Sharion Settler, DO  ibuprofen (ADVIL) 600 MG tablet Take 1 tablet (600 mg total) by mouth every 6 (six) hours as needed. 09/13/22  Yes Sharion Settler, DO  aspirin EC 81 MG tablet Take 1 tablet (81 mg total) by mouth daily. Swallow whole. 05/30/21   Fay Records, MD  cetirizine (ZYRTEC) 10 MG tablet Take 10 mg by mouth daily as needed for allergies.    [provider]  cyanocobalamin (VITAMIN B12) 1000 MCG/ML injection INJECT 1 ML (1,000 MCG TOTAL) INTO THE MUSCLE EVERY 21 ( TWENTY-ONE) DAYS. 04/15/22   Biagio Borg, MD  gabapentin (NEURONTIN) 100 MG capsule Take 1-2 capsules (100-200 mg total) by mouth at bedtime. 12/07/21   Hoyt Koch, MD  Multiple Vitamins-Minerals (PRESERVISION AREDS PO) Take 1 tablet by mouth daily.    [provider]  rosuvastatin (CRESTOR) 5 MG tablet Take 1 tablet (5 mg total) by mouth daily. 02/26/22   Fay Records, MD  SYRINGE-NEEDLE, DISP, 3 ML (BD  SAFETYGLIDE SYRINGE/NEEDLE) 25G X 1" 3 ML MISC Used to inject B12 05/25/18   Hoyt Koch, MD      Allergies    Augmentin [amoxicillin-pot clavulanate], Other, and Scallops [shellfish allergy]    Review of Systems   Review of Systems  Respiratory:  Negative for shortness of breath.   Cardiovascular:  Negative for chest pain and palpitations.  Gastrointestinal:  Negative for nausea and vomiting.  All other systems reviewed and are negative.   Physical Exam Updated Vital Signs BP 133/78   Pulse 75   Temp 97.9 F (36.6 C) (Oral)   Resp 18   Ht 5\' 4"  (1.626 m)   Wt 63.5 kg   SpO2 93%   BMI 24.03 kg/m  Physical Exam Constitutional:      General: She is not in acute distress.    Appearance: Normal appearance. She is not ill-appearing.  HENT:     Head: Normocephalic.  Eyes:     Conjunctiva/sclera: Conjunctivae normal.  Cardiovascular:     Rate and Rhythm: Normal rate.     Pulses: Normal pulses.  Pulmonary:     Effort: Pulmonary effort is normal.  Musculoskeletal:        General: Normal range of motion.     Cervical back: Neck supple. No tenderness.     Comments: TTP left upper trapezius and below left  scapula without deformity or edema. Normal ROM of bilateral UE. 5/5 strength in all rotator cuff muscles b/l. No ttp along glenohumeral joint, AC joint.   Skin:    General: Skin is warm.  Neurological:     General: No focal deficit present.     Mental Status: She is alert. Mental status is at baseline.     ED Results / Procedures / Treatments   Labs (all labs ordered are listed, but only abnormal results are displayed) Labs Reviewed - No data to display  EKG EKG Interpretation  Date/Time:  Friday September 13 2022 16:05:01 EDT Ventricular Rate:  71 PR Interval:  132 QRS Duration: 96 QT Interval:  390 QTC Calculation: 423 R Axis:   62 Text Interpretation: Normal sinus rhythm Possible Left atrial enlargement Incomplete right bundle branch block Borderline ECG  when compard to prior, similar appearance. No STEMI Confirmed by Antony Blackbird [phone]) on 09/13/2022 4:27:05 PM  Radiology No results found.  Procedures Procedures    Medications Ordered in ED Medications - No data to display  ED Course/ Medical Decision Making/ A&P                             Medical Decision Making 4:37 PM 82 y/o female with a history of controlled type 2 diabetes, hyperlipidemia, who presents with acute left shoulder and left upper back pain since this morning.  Appears most concerning for MSK etiology from overuse this week as she has been moving. Muscle strain vs spasm. Less concern for fx given no trauma. Given risk factors of T2DM, HLD, age, did also consider atypical cardiac presentation. Discussed that cardiac etiology cannot be completely ruled out without additional testing but shared decision making to defer additional cardiac workup. Offered IM toradol injection but pt would prefer oral medications. No contraindications to NSAIDs. Will rx short course of baclofen, Ibuprofen. Can use heating pad and voltaren gel PRN. Has appt with PCP on Monday, encouraged to keep appt. Return precautions discussed.    Risk Prescription drug management.         Final Clinical Impression(s) / ED Diagnoses Final diagnoses:  Acute pain of left shoulder  Trapezius strain, left, initial encounter   Rx / DC Orders ED Discharge Orders          Ordered    baclofen (LIORESAL) 10 MG tablet  3 times daily PRN        09/13/22 1640    ibuprofen (ADVIL) 600 MG tablet  Every 6 hours PRN        09/13/22 1640             Sharion Settler, DO 09/13/22 1649    Tegeler, Gwenyth Allegra, MD 09/13/22 1743

## 2022-09-13 NOTE — ED Triage Notes (Signed)
Pt to er, pt states that she woke this am with L shoulder and arm pain, states that for the past week she has been lifting and moving things, states that this pain feels like a sharp pain

## 2022-09-16 ENCOUNTER — Encounter: Payer: Self-pay | Admitting: Internal Medicine

## 2022-09-16 ENCOUNTER — Ambulatory Visit: Payer: Medicare PPO | Admitting: Internal Medicine

## 2022-09-16 VITALS — BP 118/62 | HR 70 | Temp 98.2°F | Ht 64.0 in | Wt 141.0 lb

## 2022-09-16 DIAGNOSIS — L608 Other nail disorders: Secondary | ICD-10-CM | POA: Diagnosis not present

## 2022-09-16 NOTE — Patient Instructions (Signed)
We will get you in with the podiatrist. 

## 2022-09-16 NOTE — Progress Notes (Signed)
   Subjective:   Patient ID: Erica [name], female    DOB: [birth year], 82 y.o.   MRN: [id number]  HPI The patient is an 82 YO female coming in for some moles and toenail problems.   Review of Systems  Objective:  Physical Exam  Vitals:   09/16/22 1350  BP: 118/62  Pulse: 70  Temp: 98.2 F (36.8 C)  TempSrc: Oral  SpO2: 98%  Weight: 141 lb (64 kg)  Height: 5\' 4"  (1.626 m)    Assessment & Plan:

## 2022-09-17 NOTE — Assessment & Plan Note (Signed)
Referral to podiatry for nail trimming. We discussed nature of toenail fungus and I do not advise treatment at this time for that. Okay to use athlete's foot medicine between toes for changes.

## 2022-09-28 ENCOUNTER — Other Ambulatory Visit: Payer: Self-pay | Admitting: Internal Medicine

## 2022-10-03 ENCOUNTER — Encounter: Payer: Self-pay | Admitting: Podiatry

## 2022-10-03 ENCOUNTER — Ambulatory Visit: Payer: Medicare PPO | Admitting: Podiatry

## 2022-10-03 DIAGNOSIS — B351 Tinea unguium: Secondary | ICD-10-CM

## 2022-10-03 DIAGNOSIS — L84 Corns and callosities: Secondary | ICD-10-CM

## 2022-10-03 DIAGNOSIS — G629 Polyneuropathy, unspecified: Secondary | ICD-10-CM | POA: Diagnosis not present

## 2022-10-03 DIAGNOSIS — M779 Enthesopathy, unspecified: Secondary | ICD-10-CM

## 2022-10-03 DIAGNOSIS — M79674 Pain in right toe(s): Secondary | ICD-10-CM

## 2022-10-03 DIAGNOSIS — M79675 Pain in left toe(s): Secondary | ICD-10-CM

## 2022-10-03 NOTE — Progress Notes (Signed)
Subjective:   Patient ID: Erica [name], female   DOB: 82 y.o.   MRN: [id number]   HPI Patient presents stating she is got trouble with her toenails that she cannot take care of and are becoming severely elongated calluses on the big toes of both feet and history of neuropathy and is on gabapentin 100 mg but it does not seem to be effective.  Patient does not smoke likes to be active   Review of Systems  All other systems reviewed and are negative.       Objective:  Physical Exam Vitals and nursing note reviewed.  Constitutional:      Appearance: She is well-developed.  Pulmonary:     Effort: Pulmonary effort is normal.  Musculoskeletal:        General: Normal range of motion.  Skin:    General: Skin is warm.  Neurological:     Mental Status: She is alert.     Neurovascular status was found to be intact with diminishment of sharp dull vibratory with severely elongated nailbeds 1-5 both feet incurvated in the corners with moderate looseness of several nails and discomfort.  Patient has keratotic lesions hallux bilateral medial side to continue with some perfusion and does seem to have moderate discomfort in the lesser MPJs especially at night     Assessment:  Mycotic nail infection with pain 1-5 both feet along with lesion formation chronic bilateral probable neuropathy and probable low level capsulitis across the MPJs     Plan:  H&P reviewed discussed all conditions at this point sharp sterile debridement of lesions bilateral hallux no angiogenic bleeding with neuropathy is complicating factor to that and debridement of nailbeds 1-5 both feet neurogenic bleeding which can be done on a routine basis.  Placed on gabapentin 300 mg to see whether or not this will be better for her than the 100 mg she is taking to control her discomfort

## 2022-10-18 ENCOUNTER — Telehealth: Payer: Self-pay

## 2022-10-18 NOTE — Telephone Encounter (Signed)
Pt has asked for a refill of her   gabapentin (NEURONTIN) 100 MG capsule  And was informed by the foot doctor that she needed to be on a higher dose. I have informed the pt I will send info to Dr. Okey Dupre.

## 2022-10-22 NOTE — Telephone Encounter (Signed)
Would recommend consistently taking 2 pills at night time and if no improvement in pain after 1 week ok to increase to 3 pills at night time. Call to update [place] about if this is working and at what dose.

## 2022-10-22 NOTE — Telephone Encounter (Signed)
Is there a dose she is asking for?

## 2022-10-22 NOTE — Telephone Encounter (Signed)
Called pt she states it doesn't matter ut not too strong. She is currently taking Gabapentin 100 mg 1 to 2 at bedtime..Santiago Bumpers

## 2022-10-23 NOTE — Telephone Encounter (Signed)
Called patient and was unable to leave a VS

## 2022-11-14 ENCOUNTER — Encounter: Payer: Self-pay | Admitting: Family Medicine

## 2022-11-14 ENCOUNTER — Ambulatory Visit: Payer: Medicare PPO | Admitting: Family Medicine

## 2022-11-14 VITALS — BP 104/70 | HR 74 | Ht 64.0 in | Wt 140.0 lb

## 2022-11-14 DIAGNOSIS — R252 Cramp and spasm: Secondary | ICD-10-CM | POA: Diagnosis not present

## 2022-11-14 DIAGNOSIS — E538 Deficiency of other specified B group vitamins: Secondary | ICD-10-CM

## 2022-11-14 DIAGNOSIS — G6289 Other specified polyneuropathies: Secondary | ICD-10-CM

## 2022-11-14 LAB — COMPREHENSIVE METABOLIC PANEL
ALT: 17 U/L (ref 0–35)
AST: 15 U/L (ref 0–37)
Albumin: 1.6 g/dL — ABNORMAL LOW (ref 3.5–5.2)
Alkaline Phosphatase: 57 U/L (ref 39–117)
BUN: 22 mg/dL (ref 6–23)
CO2: 28 mEq/L (ref 19–32)
Calcium: 8.9 mg/dL (ref 8.4–10.5)
Chloride: 106 mEq/L (ref 96–112)
Creatinine, Ser: 0.89 mg/dL (ref 0.40–1.20)
GFR: 59.6 mL/min — ABNORMAL LOW (ref >60.00)
Glucose, Bld: 119 mg/dL — ABNORMAL HIGH (ref 70–99)
Potassium: 4.4 mEq/L (ref 3.5–5.1)
Sodium: 140 mEq/L (ref 135–145)
Total Bilirubin: 0.3 mg/dL (ref 0.2–1.2)
Total Protein: 6.4 g/dL (ref 6.0–8.3)

## 2022-11-14 LAB — TSH: TSH: 1.74 u[IU]/mL (ref 0.35–5.50)

## 2022-11-14 LAB — CK: Total CK: 111 U/L (ref 7–177)

## 2022-11-14 LAB — VITAMIN B12: Vitamin B-12: 683 pg/mL (ref 211–911)

## 2022-11-14 LAB — MAGNESIUM: Magnesium: 1.8 mg/dL (ref 1.5–2.5)

## 2022-11-14 MED ORDER — BACLOFEN 10 MG PO TABS: 10.0000 mg | ORAL_TABLET | Freq: Every evening | ORAL | 2 refills | Status: DC | PRN

## 2022-11-14 NOTE — Progress Notes (Signed)
   I, Stevenson Clinch, CMA acting as a scribe for Erica Graham, MD.  Erica [name] is a 82 y.o. female who presents to [facility] at [facility] today for leg cramps x years, worsening more recently. She has a hx of neuropathy and is currently under the care of podiatry. Pt locates areas of cramping to both legs, upper and lower. Sx worse at night. Will wake at night d/t the pain. Sx eventually resolve with stretching and shaking. Notes that sx tend to worsen right before B12 shot is due. Has tried taking Magnesium and Potassium with slight improvement of sx. Also notes that sx are worse after being up and more active throughout the day. Has been staying hydrated with water. Notes intermittent LBP.   She notes a history of bilateral lower extremity paresthesias and B12 deficiency.  Pertinent review of systems: No fevers or chills  Relevant historical information: Diabetes well-controlled.  Peripheral neuropathy.  B12 deficiency.  Kidney stones.   Exam:  BP 104/70   Pulse 74   Ht 5\' 4"  (1.626 m)   Wt 140 lb (63.5 kg)   SpO2 96%   BMI 24.03 kg/m  General: Well Developed, well nourished, and in no acute distress.   MSK: Lower extremity strength motion and sensation is intact distal bilateral extremities.      Assessment and Plan: 82 y.o. female with nocturnal cramping.  Etiology is unclear at this time.  Plan for metabolic workup listed below.  Will try baclofen at bedtime.  Next step if needed may be nerve conduction study.   PDMP not reviewed this encounter. Orders Placed This Encounter  Procedures   Comprehensive metabolic panel    Standing Status:   Future    Number of Occurrences:   1    Standing Expiration Date:   11/14/2023   Magnesium    Standing Status:   Future    Number of Occurrences:   1    Standing Expiration Date:   11/14/2023   TSH    Standing Status:   Future    Number of Occurrences:   1    Standing Expiration Date:   11/14/2023   CK  (Creatine Kinase)    Standing Status:   Future    Number of Occurrences:   1    Standing Expiration Date:   11/14/2023   B12    Standing Status:   Future    Number of Occurrences:   1    Standing Expiration Date:   11/14/2023   Meds ordered this encounter  Medications   baclofen (LIORESAL) 10 MG tablet    Sig: Take 1 tablet (10 mg total) by mouth at bedtime as needed for muscle spasms.    Dispense:  60 tablet    Refill:  2     Discussed warning signs or symptoms. Please see discharge instructions. Patient expresses understanding.   The above documentation has been reviewed and is accurate and complete Erica [name], M.D.

## 2022-11-14 NOTE — Patient Instructions (Addendum)
Thank you for coming in today.   Please get labs today before you leave   I've sent a prescription for baclofen to your pharmacy.

## 2022-11-15 NOTE — Progress Notes (Signed)
The minerals in your blood, the electrolytes, levels are okay.  However the albumin protein in your blood is quite low.  We should do some further testing on recheck in 1 month.

## 2022-12-03 DIAGNOSIS — E785 Hyperlipidemia, unspecified: Secondary | ICD-10-CM | POA: Diagnosis not present

## 2022-12-03 DIAGNOSIS — J45909 Unspecified asthma, uncomplicated: Secondary | ICD-10-CM | POA: Diagnosis not present

## 2022-12-03 DIAGNOSIS — R32 Unspecified urinary incontinence: Secondary | ICD-10-CM | POA: Diagnosis not present

## 2022-12-03 DIAGNOSIS — H3530 Unspecified macular degeneration: Secondary | ICD-10-CM | POA: Diagnosis not present

## 2022-12-03 DIAGNOSIS — M62838 Other muscle spasm: Secondary | ICD-10-CM | POA: Diagnosis not present

## 2022-12-03 DIAGNOSIS — Z800 Family history of malignant neoplasm of digestive organs: Secondary | ICD-10-CM | POA: Diagnosis not present

## 2022-12-03 DIAGNOSIS — Z8249 Family history of ischemic heart disease and other diseases of the circulatory system: Secondary | ICD-10-CM | POA: Diagnosis not present

## 2022-12-03 DIAGNOSIS — M1990 Unspecified osteoarthritis, unspecified site: Secondary | ICD-10-CM | POA: Diagnosis not present

## 2022-12-03 DIAGNOSIS — Z8261 Family history of arthritis: Secondary | ICD-10-CM | POA: Diagnosis not present

## 2022-12-17 ENCOUNTER — Encounter: Payer: Self-pay | Admitting: Internal Medicine

## 2022-12-17 ENCOUNTER — Ambulatory Visit: Payer: Medicare PPO | Admitting: Internal Medicine

## 2022-12-17 VITALS — BP 114/60 | HR 64 | Temp 92.1°F | Ht 64.0 in | Wt 140.0 lb

## 2022-12-17 DIAGNOSIS — E119 Type 2 diabetes mellitus without complications: Secondary | ICD-10-CM

## 2022-12-17 DIAGNOSIS — M6281 Muscle weakness (generalized): Secondary | ICD-10-CM

## 2022-12-17 DIAGNOSIS — R770 Abnormality of albumin: Secondary | ICD-10-CM

## 2022-12-17 LAB — COMPREHENSIVE METABOLIC PANEL
ALT: 14 U/L (ref 0–35)
AST: 15 U/L (ref 0–37)
Albumin: 4 g/dL (ref 3.5–5.2)
Alkaline Phosphatase: 58 U/L (ref 39–117)
BUN: 22 mg/dL (ref 6–23)
CO2: 28 mEq/L (ref 19–32)
Calcium: 9.4 mg/dL (ref 8.4–10.5)
Chloride: 105 mEq/L (ref 96–112)
Creatinine, Ser: 0.79 mg/dL (ref 0.40–1.20)
GFR: 68.72 mL/min (ref >60.00)
Glucose, Bld: 87 mg/dL (ref 70–99)
Potassium: 4.6 mEq/L (ref 3.5–5.1)
Sodium: 139 mEq/L (ref 135–145)
Total Bilirubin: 0.4 mg/dL (ref 0.2–1.2)
Total Protein: 6.5 g/dL (ref 6.0–8.3)

## 2022-12-17 LAB — MICROALBUMIN / CREATININE URINE RATIO
Creatinine,U: 91.7 mg/dL
Microalb Creat Ratio: 0.8 mg/g (ref 0.0–30.0)
Microalb, Ur: <0.7 mg/dL (ref 0.0–1.9)

## 2022-12-17 LAB — HEMOGLOBIN A1C: Hgb A1c MFr Bld: 6.3 % (ref 4.6–6.5)

## 2022-12-17 NOTE — Patient Instructions (Addendum)
Once it's been about 2 weeks try stopping the muscle relaxer.   We will recheck the labs today. We will recheck the cholesterol in a few months.

## 2022-12-17 NOTE — Progress Notes (Unsigned)
   Subjective:   Patient ID: Erica [name], female    DOB: 1940-08-02, 82 y.o.   MRN: [id number]  HPI The patient is an 82 YO female coming in for follow up cramps and weakness in legs. Stopped statin and this is resolving about 80% already with the cramps and weakness stable.   Review of Systems  Constitutional: Negative.   HENT: Negative.    Eyes: Negative.   Respiratory:  Negative for cough, chest tightness and shortness of breath.   Cardiovascular:  Negative for chest pain, palpitations and leg swelling.  Gastrointestinal:  Negative for abdominal distention, abdominal pain, constipation, diarrhea, nausea and vomiting.  Musculoskeletal:  Positive for myalgias.  Skin: Negative.   Neurological:  Positive for weakness.  Psychiatric/Behavioral: Negative.      Objective:  Physical Exam Constitutional:      Appearance: She is well-developed.  HENT:     Head: Normocephalic and atraumatic.  Cardiovascular:     Rate and Rhythm: Normal rate and regular rhythm.  Pulmonary:     Effort: Pulmonary effort is normal. No respiratory distress.     Breath sounds: Normal breath sounds. No wheezing or rales.  Abdominal:     General: Bowel sounds are normal. There is no distension.     Palpations: Abdomen is soft.     Tenderness: There is no abdominal tenderness. There is no rebound.  Musculoskeletal:     Cervical back: Normal range of motion.  Skin:    General: Skin is warm and dry.  Neurological:     Mental Status: She is alert and oriented to person, place, and time.     Coordination: Coordination normal.     Vitals:   12/17/22 1340  BP: 114/60  Pulse: 64  Temp: (!) 92.1 F (33.4 C)  TempSrc: Oral  SpO2: 94%  Weight: 140 lb (63.5 kg)  Height: 5\' 4"  (1.626 m)    Assessment & Plan:

## 2022-12-18 LAB — PREALBUMIN: Prealbumin: 26 mg/dL (ref 17–34)

## 2022-12-19 DIAGNOSIS — R29898 Other symptoms and signs involving the musculoskeletal system: Secondary | ICD-10-CM | POA: Insufficient documentation

## 2022-12-19 DIAGNOSIS — R770 Abnormality of albumin: Secondary | ICD-10-CM | POA: Insufficient documentation

## 2022-12-19 DIAGNOSIS — M6281 Muscle weakness (generalized): Secondary | ICD-10-CM | POA: Insufficient documentation

## 2022-12-19 NOTE — Assessment & Plan Note (Signed)
Unusual finding on recent labs with albumin 1.6 and total protein normal. Will check CMP and pre-albumin to recheck these levels. She does not appear to be nutritionally impaired or with significant weight loss.

## 2022-12-19 NOTE — Assessment & Plan Note (Signed)
Checking microalbumin to creatinine ratio for wasting of protein in urine given recent finding. Not due for HgA1c at this time. Diet controlled. Not on ACE-I/ARB and recently stopped statin due to muscle weakness and cramps.

## 2022-12-19 NOTE — Assessment & Plan Note (Signed)
She feels this is associated with statin usage. She has stopped crestor about 3 weeks ago. Typically for muscle buildup can take 2-3 months. She is no longer have cramping and asked her once 1-2 weeks since last muscle cramp to stop muscle relaxer scheduled at night time and use prn only.

## 2023-01-06 ENCOUNTER — Encounter: Payer: Self-pay | Admitting: Family Medicine

## 2023-01-06 ENCOUNTER — Ambulatory Visit: Payer: Medicare PPO | Admitting: Family Medicine

## 2023-01-06 ENCOUNTER — Ambulatory Visit (INDEPENDENT_AMBULATORY_CARE_PROVIDER_SITE_OTHER): Payer: Medicare PPO

## 2023-01-06 VITALS — BP 110/66 | HR 70 | Ht 64.0 in | Wt 141.6 lb

## 2023-01-06 DIAGNOSIS — R252 Cramp and spasm: Secondary | ICD-10-CM

## 2023-01-06 DIAGNOSIS — M7652 Patellar tendinitis, left knee: Secondary | ICD-10-CM | POA: Diagnosis not present

## 2023-01-06 DIAGNOSIS — M25562 Pain in left knee: Secondary | ICD-10-CM | POA: Diagnosis not present

## 2023-01-06 NOTE — Patient Instructions (Signed)
Thank you for coming in today.   Please get an Xray today before you leave   Please use Voltaren gel (Generic Diclofenac Gel) up to 4x daily for pain as needed.  This is available over-the-counter as both the name brand Voltaren gel and the generic diclofenac gel.   Try a compression sleeve.   Let me know if this is not going well. We can do injections.

## 2023-01-06 NOTE — Progress Notes (Signed)
   I, Stevenson Clinch, CMA acting as a scribe for [name], MD.  Erica [name] is a 82 y.o. female who presents to [facility] at [facility] today for f/u bilat leg cramping. Pt was last seen by Dr. Denyse Amass on 11/14/22 and labs were obtained and she was prescribed baclofen.  Today, pt reports improvement of sx while on Baclofen, unsure how long she can stay on that med. Has also stopped taking statins, thinks this has been helpful as well. Notes that the left knee has been giving out over the past week. Has been working in the yard more recently. Denies swelling around the knee. Pain at lateral aspect of the knee. Denies past injury. No treatments specific to knee pain.   Dx testing: 12/17/22 Labs 11/14/22 Labs  Pertinent review of systems: No fevers or chills  Relevant historical information: Diet-controlled diabetes.  A1c less than 6.5 currently.   Exam:  BP 110/66   Pulse 70   Ht 5\' 4"  (1.626 m)   Wt 141 lb 9.6 oz (64.2 kg)   SpO2 95%   BMI 24.31 kg/m  General: Well Developed, well nourished, and in no acute distress.   MSK: Left knee: Normal-appearing Tender palpation lateral joint line. Normal knee motion. Stable ligamentous exam. Intact strength.    Lab and Radiology Results  X-ray images left knee obtained today personally and independently interpreted Mild patellofemoral DJD. Await formal radiology review    Assessment and Plan: 82 y.o. female with left lateral knee pain.  Thought to be degenerative meniscus tear likely.  Discussed options.  Plan for bit of watchful waiting with Voltaren gel and compression sleeve.  Consider steroid injection in the future if needed.  She would like to avoid injection today.  Her leg pain and cramping have resolved with the cessation of statins.  She so far has tried atorvastatin and rosuvastatin.  Reasonable next step if anticholesterol medication is warranted could be Pitavastatin.  There are other options as  well per her PCP. Recommend weaning off of baclofen when able.  PDMP not reviewed this encounter. Orders Placed This Encounter  Procedures   DG Knee AP/LAT W/Sunrise Left    Standing Status:   Future    Number of Occurrences:   1    Standing Expiration Date:   02/06/2023    Order Specific Question:   Reason for Exam (SYMPTOM  OR DIAGNOSIS REQUIRED)    Answer:   left knee pain    Order Specific Question:   Preferred imaging location?    Answer:   [facility]   No orders of the defined types were placed in this encounter.    Discussed warning signs or symptoms. Please see discharge instructions. Patient expresses understanding.   The above documentation has been reviewed and is accurate and complete [name], M.D.

## 2023-01-07 ENCOUNTER — Encounter (HOSPITAL_COMMUNITY): Payer: Self-pay

## 2023-01-07 ENCOUNTER — Other Ambulatory Visit: Payer: Self-pay

## 2023-01-07 ENCOUNTER — Emergency Department (HOSPITAL_COMMUNITY)
Admission: EM | Admit: 2023-01-07 | Discharge: 2023-01-07 | Disposition: A | Discharge status: Home / Self Care | Payer: Medicare PPO | Attending: Emergency Medicine | Admitting: Emergency Medicine

## 2023-01-07 ENCOUNTER — Emergency Department (HOSPITAL_COMMUNITY): Payer: Medicare PPO

## 2023-01-07 DIAGNOSIS — Z1152 Encounter for screening for COVID-19: Secondary | ICD-10-CM | POA: Insufficient documentation

## 2023-01-07 DIAGNOSIS — Z7982 Long term (current) use of aspirin: Secondary | ICD-10-CM | POA: Diagnosis not present

## 2023-01-07 DIAGNOSIS — R0789 Other chest pain: Secondary | ICD-10-CM | POA: Insufficient documentation

## 2023-01-07 DIAGNOSIS — R55 Syncope and collapse: Secondary | ICD-10-CM | POA: Insufficient documentation

## 2023-01-07 DIAGNOSIS — R531 Weakness: Secondary | ICD-10-CM | POA: Insufficient documentation

## 2023-01-07 DIAGNOSIS — R079 Chest pain, unspecified: Secondary | ICD-10-CM | POA: Diagnosis not present

## 2023-01-07 DIAGNOSIS — R110 Nausea: Secondary | ICD-10-CM | POA: Diagnosis not present

## 2023-01-07 DIAGNOSIS — R0602 Shortness of breath: Secondary | ICD-10-CM | POA: Diagnosis not present

## 2023-01-07 LAB — CBC WITH DIFFERENTIAL/PLATELET
Abs Immature Granulocytes: 0.03 10*3/uL (ref 0.00–0.07)
Basophils Absolute: 0.1 10*3/uL (ref 0.0–0.1)
Basophils Relative: 1 %
Eosinophils Absolute: 0.3 10*3/uL (ref 0.0–0.5)
Eosinophils Relative: 4 %
HCT: 41 % (ref 36.0–46.0)
Hemoglobin: 13.5 g/dL (ref 12.0–15.0)
Immature Granulocytes: 1 %
Lymphocytes Relative: 31 %
Lymphs Abs: 2.1 10*3/uL (ref 0.7–4.0)
MCH: 30.1 pg (ref 26.0–34.0)
MCHC: 32.9 g/dL (ref 30.0–36.0)
MCV: 91.5 fL (ref 80.0–100.0)
Monocytes Absolute: 0.6 10*3/uL (ref 0.1–1.0)
Monocytes Relative: 9 %
Neutro Abs: 3.6 10*3/uL (ref 1.7–7.7)
Neutrophils Relative %: 54 %
Platelets: 227 10*3/uL (ref 150–400)
RBC: 4.48 MIL/uL (ref 3.87–5.11)
RDW: 13.2 % (ref 11.5–15.5)
WBC: 6.7 10*3/uL (ref 4.0–10.5)
nRBC: 0 % (ref 0.0–0.2)

## 2023-01-07 LAB — COMPREHENSIVE METABOLIC PANEL
ALT: 16 U/L (ref 0–44)
AST: 16 U/L (ref 15–41)
Albumin: 3.5 g/dL (ref 3.5–5.0)
Alkaline Phosphatase: 50 U/L (ref 38–126)
Anion gap: 8 (ref 5–15)
BUN: 21 mg/dL (ref 8–23)
CO2: 23 mmol/L (ref 22–32)
Calcium: 8.2 mg/dL — ABNORMAL LOW (ref 8.9–10.3)
Chloride: 107 mmol/L (ref 98–111)
Creatinine, Ser: 0.68 mg/dL (ref 0.44–1.00)
GFR, Estimated: >60 mL/min (ref >60)
Glucose, Bld: 117 mg/dL — ABNORMAL HIGH (ref 70–99)
Potassium: 3.7 mmol/L (ref 3.5–5.1)
Sodium: 138 mmol/L (ref 135–145)
Total Bilirubin: 0.5 mg/dL (ref 0.3–1.2)
Total Protein: 6.2 g/dL — ABNORMAL LOW (ref 6.5–8.1)

## 2023-01-07 LAB — SARS CORONAVIRUS 2 BY RT PCR: SARS Coronavirus 2 by RT PCR: NEGATIVE

## 2023-01-07 LAB — URINALYSIS, W/ REFLEX TO CULTURE (INFECTION SUSPECTED)
Bacteria, UA: NONE SEEN
Bilirubin Urine: NEGATIVE
Glucose, UA: NEGATIVE mg/dL
Hgb urine dipstick: NEGATIVE
Ketones, ur: NEGATIVE mg/dL
Leukocytes,Ua: NEGATIVE
Nitrite: NEGATIVE
Protein, ur: NEGATIVE mg/dL
Specific Gravity, Urine: 1.005 (ref 1.005–1.030)
pH: 7 (ref 5.0–8.0)

## 2023-01-07 LAB — TROPONIN I (HIGH SENSITIVITY)
Troponin I (High Sensitivity): 3 ng/L (ref <18)
Troponin I (High Sensitivity): 3 ng/L (ref <18)

## 2023-01-07 MED ORDER — SODIUM CHLORIDE 0.9 % IV BOLUS
500.0000 mL | Freq: Once | INTRAVENOUS | Status: AC
  Administered 2023-01-07: 500 mL via INTRAVENOUS

## 2023-01-07 MED ORDER — ONDANSETRON HCL 4 MG PO TABS: 4.0000 mg | ORAL_TABLET | Freq: Four times a day (QID) | ORAL | 0 refills | Status: DC

## 2023-01-07 MED ORDER — ONDANSETRON HCL 4 MG/2ML IJ SOLN
4.0000 mg | Freq: Once | INTRAMUSCULAR | Status: AC
  Administered 2023-01-07: 4 mg via INTRAVENOUS
  Filled 2023-01-07: qty 2

## 2023-01-07 NOTE — ED Triage Notes (Signed)
Patient brought in by EMS due to feeling like she was going to pass out today. Patient also reports chest pressure since last night. Pt states that earlier today she felt like she was unable to breathe. Denies passing out, but felt like it. Pt given 324mg  aspirin by fire.

## 2023-01-07 NOTE — Discharge Instructions (Addendum)
Return for any problem.  ?

## 2023-01-07 NOTE — ED Provider Notes (Signed)
[place] EMERGENCY DEPARTMENT AT [facility] Provider Note   CSN: [id number] Arrival date & time: 01/07/23  1450     History  Chief Complaint  Patient presents with   Near Syncope   Chest Pain    Erica [name] is a 82 y.o. female.  82 year old female with prior medical history as detailed below presents for evaluation.  Patient reports that over the course of today she has had generalized weakness.  She reports that she had a feeling that she might actually pass out several times.  She did not actually experience any syncope.  She denies headache or vision change.  She reports intermittent chest pressure.  She denies chest pain.  She denies shortness of breath.    Symptoms began late yesterday evening and she went to bed expecting her to feel better in the morning.  Symptoms persisted throughout the course of today and she decided to come to the ED for evaluation.  Patient denies prior history of cardiac disease.  She reports some mild nausea associated with her symptoms.  She denies diarrhea or vomiting.  The history is provided by the patient and medical records.       Home Medications Prior to Admission medications   Medication Sig Start Date End Date Taking? Authorizing Provider  ondansetron (ZOFRAN) 4 MG tablet Take 1 tablet (4 mg total) by mouth every 6 (six) hours. 01/07/23  Yes Wynetta Fines, MD  aspirin EC 81 MG tablet Take 1 tablet (81 mg total) by mouth daily. Swallow whole. 05/30/21   Pricilla Riffle, MD  baclofen (LIORESAL) 10 MG tablet Take 1 tablet (10 mg total) by mouth at bedtime as needed for muscle spasms. 11/14/22   Rodolph Bong, MD  cetirizine (ZYRTEC) 10 MG tablet Take 10 mg by mouth daily as needed for allergies.    [provider]  cyanocobalamin (VITAMIN B12) 1000 MCG/ML injection INJECT 1 ML (1,000 MCG TOTAL) INTO THE MUSCLE EVERY 21 ( TWENTY-ONE) DAYS. 09/30/22   Corwin Levins, MD  gabapentin (NEURONTIN) 100 MG capsule Take 1-2  capsules (100-200 mg total) by mouth at bedtime. 12/07/21   Myrlene Broker, MD  ibuprofen (ADVIL) 600 MG tablet Take 1 tablet (600 mg total) by mouth every 6 (six) hours as needed. 09/13/22   Sabino Dick, DO  Multiple Vitamins-Minerals (PRESERVISION AREDS PO) Take 1 tablet by mouth daily.    [provider]  SYRINGE-NEEDLE, DISP, 3 ML (BD SAFETYGLIDE SYRINGE/NEEDLE) 25G X 1" 3 ML MISC Used to inject B12 05/25/18   Myrlene Broker, MD      Allergies    Augmentin [amoxicillin-pot clavulanate], Other, and Scallops [shellfish allergy]    Review of Systems   Review of Systems  All other systems reviewed and are negative.   Physical Exam Updated Vital Signs BP (!) 142/79   Pulse 63   Temp 98.2 F (36.8 C) (Oral)   Resp 18   Ht 5\' 3"  (1.6 m)   Wt 63.5 kg   SpO2 99%   BMI 24.80 kg/m  Physical Exam Vitals and nursing note reviewed.  Constitutional:      General: She is not in acute distress.    Appearance: Normal appearance. She is well-developed.  HENT:     Head: Normocephalic and atraumatic.  Eyes:     Conjunctiva/sclera: Conjunctivae normal.     Pupils: Pupils are equal, round, and reactive to light.  Cardiovascular:     Rate and Rhythm: Normal  rate and regular rhythm.     Heart sounds: Normal heart sounds.  Pulmonary:     Effort: Pulmonary effort is normal. No respiratory distress.     Breath sounds: Normal breath sounds.  Abdominal:     General: There is no distension.     Palpations: Abdomen is soft.     Tenderness: There is no abdominal tenderness.  Musculoskeletal:        General: No deformity. Normal range of motion.     Cervical back: Normal range of motion and neck supple.  Skin:    General: Skin is warm and dry.  Neurological:     General: No focal deficit present.     Mental Status: She is alert and oriented to person, place, and time.     ED Results / Procedures / Treatments   Labs (all labs ordered are listed, but only  abnormal results are displayed) Labs Reviewed  COMPREHENSIVE METABOLIC PANEL - Abnormal; Notable for the following components:      Result Value   Glucose, Bld 117 (*)    Calcium 8.2 (*)    Total Protein 6.2 (*)    All other components within normal limits  URINALYSIS, W/ REFLEX TO CULTURE (INFECTION SUSPECTED) - Abnormal; Notable for the following components:   Color, Urine STRAW (*)    All other components within normal limits  SARS CORONAVIRUS 2 BY RT PCR  CBC WITH DIFFERENTIAL/PLATELET  TROPONIN I (HIGH SENSITIVITY)  TROPONIN I (HIGH SENSITIVITY)    EKG EKG Interpretation Date/Time:  Tuesday January 07 2023 15:32:12 EDT Ventricular Rate:  67 PR Interval:  136 QRS Duration:  90 QT Interval:  408 QTC Calculation: 431 R Axis:   67  Text Interpretation: Sinus rhythm Biatrial enlargement Confirmed by Kristine Royal [phone]) on 01/07/2023 4:21:15 PM  Radiology DG Chest Port 1 View  Result Date: 01/07/2023 CLINICAL DATA:  sob EXAM: PORTABLE CHEST 1 VIEW COMPARISON:  None Available. FINDINGS: The heart size and mediastinal contours are within normal limits. Both lungs are clear. The visualized skeletal structures are unremarkable. IMPRESSION: No focal airspace opacity Electronically Signed   By: Lorenza Cambridge M.D.   On: 01/07/2023 16:08    Procedures Procedures    Medications Ordered in ED Medications  ondansetron (ZOFRAN) injection 4 mg (has no administration in time range)  sodium chloride 0.9 % bolus 500 mL (0 mLs Intravenous Stopped 01/07/23 1918)    ED Course/ Medical Decision Making/ A&P                             Medical Decision Making Amount and/or Complexity of Data Reviewed Labs: ordered. Radiology: ordered.  Risk Prescription drug management.    Medical Screen Complete  This patient presented to the ED with complaint of weakness, atypical chest pain.  This complaint involves an extensive number of treatment options. The initial differential diagnosis  includes, but is not limited to, ACS, metabolic abnormality, pneumonia, viral infection, etc.  This presentation is: Acute, Self-Limited, Previously Undiagnosed, Uncertain Prognosis, Complicated, Systemic Symptoms, and Threat to Life/Bodily Function   Patient presenting with complaint of generalized weakness and atypical chest pain.  EKG is without evidence of acute ischemia.  Troponin x 2 is barely detectable at 3.  Patient's other workup is without evidence of significant abnormality.  Patient is feeling improved after ED evaluation and treatment. Patient desires discharge home.  She declines overnight admission for observation.  Importance of close follow-up  was stressed.  Strict return precautions given and understood.     Additional history obtained: External records from outside sources obtained and reviewed including prior ED visits and prior Inpatient records.    Lab Tests:  I ordered and personally interpreted labs.   Imaging Studies ordered:  I ordered imaging studies including CXR  I independently visualized and interpreted obtained imaging which showed NAD I agree with the radiologist interpretation.   Cardiac Monitoring:  The patient was maintained on a cardiac monitor.  I personally viewed and interpreted the cardiac monitor which showed an underlying rhythm of: NSR   Problem List / ED Course:  Weakness, Atypical chest pain   Reevaluation:  After the interventions noted above, I reevaluated the patient and found that they have: improved  Disposition:  After consideration of the diagnostic results and the patients response to treatment, I feel that the patent would benefit from close outpatient followup.          Final Clinical Impression(s) / ED Diagnoses Final diagnoses:  Atypical chest pain    Rx / DC Orders ED Discharge Orders          Ordered    ondansetron (ZOFRAN) 4 MG tablet  Every 6 hours        01/07/23 1905               Wynetta Fines, MD 01/07/23 1931

## 2023-01-09 ENCOUNTER — Telehealth: Payer: Medicare PPO | Admitting: Nurse Practitioner

## 2023-01-09 DIAGNOSIS — U071 COVID-19: Secondary | ICD-10-CM

## 2023-01-09 MED ORDER — NIRMATRELVIR/RITONAVIR (PAXLOVID)TABLET
3.0000 | ORAL_TABLET | Freq: Two times a day (BID) | ORAL | 0 refills | Status: AC
Start: 2023-01-09 — End: 2023-01-14

## 2023-01-09 NOTE — Assessment & Plan Note (Signed)
Acute Symptoms remain mild Due to patient's age I do feel she would be a good candidate for Paxlovid Kidney function identifies EGFR greater than 60 Patient declines cough suppressant Paxlovid sent to pharmacy, patient encouraged to call 911 if symptoms worsen especially if she experiences high fever, shortness of breath, worsening cardiac palpitations or chest pain.  She reports understanding.

## 2023-01-09 NOTE — Progress Notes (Signed)
   Established Patient Office Visit  An audio/visual tele-health visit was completed today for this patient. I connected with  Makenzye Angelia Mould on 01/09/23 utilizing audio/visual technology and verified that I am speaking with the correct person using two identifiers. The patient was located at their home, and I was located at the office of [facility] at [facility] during the encounter. I discussed the limitations of evaluation and management by telemedicine. The patient expressed understanding and agreed to proceed.     Subjective   Patient ID: TAWNA ALWIN, female    DOB: 1940-12-06  Age: 82 y.o. MRN: [id number]  Chief Complaint  Patient presents with   Covid Positive    Symptom onset 3 days ago, took a test at home it was positive.  Seen in the hospital earlier this week for near syncopal episode and cardiac palpitations.  Reports that she is no longer having chest pain but does have cardiac palpitations intermittently at times.  Has known exposure to COVID as well.  Would like to take Paxlovid as she reports having taken in the past and tolerated well.  Per chart review last EGFR was 68.72 and this was collected within the last month.    Review of Systems  Constitutional:  Negative for chills and fever.  Respiratory:  Negative for cough and shortness of breath.   Cardiovascular:  Positive for palpitations. Negative for chest pain.  Neurological:  Positive for dizziness (near syncope).      Objective:     SpO2 97%    Physical Exam Comprehensive physical exam not completed today as office visit was conducted remotely.  Patient appears well over video, no evidence of respiratory distress noted..  Patient was alert and oriented, and appeared to have appropriate judgment.   No results found for any visits on 01/09/23.    The ASCVD Risk score (Arnett DK, et al., 2019) failed to calculate for the following reasons:   The 2019 ASCVD risk score is only valid for ages  32 to 41    Assessment & Plan:   Problem List Items Addressed This Visit       Other   COVID-19 - Primary    Acute Symptoms remain mild Due to patient's age I do feel she would be a good candidate for Paxlovid Kidney function identifies EGFR greater than 60 Patient declines cough suppressant Paxlovid sent to pharmacy, patient encouraged to call 911 if symptoms worsen especially if she experiences high fever, shortness of breath, worsening cardiac palpitations or chest pain.  She reports understanding.      Relevant Medications   nirmatrelvir/ritonavir (PAXLOVID) 20 x 150 MG & 10 x 100MG  TABS    No follow-ups on file.    Elenore Paddy, NP

## 2023-01-10 ENCOUNTER — Telehealth: Payer: Self-pay

## 2023-01-10 NOTE — Transitions of Care (Post Inpatient/ED Visit) (Signed)
01/10/2023  Name: Erica [name] MRN: [id number] DOB: Dec 20, 1940  Today's TOC FU Call Status: Today's TOC FU Call Status:: Successful TOC FU Call Competed TOC FU Call Complete Date: 01/10/23  [facility] EMMI-ED Discharge Date and Reason: 01/09/23-"Scheduled follow up appt? No"   Transition Care Management Follow-up Telephone Call Date of Discharge: 01/07/23 Discharge Facility: [facility]) Type of Discharge: Emergency Department Reason for ED Visit: Other: ("atypical chest pain") How have you been since you were released from the hospital?: Better (Pt states she tested positive for COVID-19-had virtual visit with MD-started on txs-"today she feels much better"-appetite is back,more energy-feels she has "mild case as got COVID booster in May.") Any questions or concerns?: No  Items Reviewed: Did you receive and understand the discharge instructions provided?: Yes Medications obtained,verified, and reconciled?: Yes (Medications Reviewed) Any new allergies since your discharge?: No Dietary orders reviewed?: Yes Type of Diet Ordered:: low salt/heart healthy Do you have support at home?: Yes People in Home: spouse Name of Support/Comfort Primary Source: Earvin Hansen  Medications Reviewed Today: Medications Reviewed Today     Reviewed by Charlyn Minerva, RN (Registered Nurse) on 01/10/23 at 1318  Med List Status: <None>   Medication Order Taking? Sig Documenting Provider Last Dose Status Informant  aspirin EC 81 MG tablet [id number] Yes Take 1 tablet (81 mg total) by mouth daily. Swallow whole. Pricilla Riffle, MD Taking Active   baclofen (LIORESAL) 10 MG tablet [id number] Yes Take 1 tablet (10 mg total) by mouth at bedtime as needed for muscle spasms. Rodolph Bong, MD Taking Active   cetirizine (ZYRTEC) 10 MG tablet [id number] Yes Take 10 mg by mouth daily as needed for allergies. [provider] Taking Active Self  cyanocobalamin (VITAMIN B12) 1000 MCG/ML injection  [id number] Yes INJECT 1 ML (1,000 MCG TOTAL) INTO THE MUSCLE EVERY 21 ( TWENTY-ONE) DAYS. Corwin Levins, MD Taking Active   gabapentin (NEURONTIN) 100 MG capsule [id number] Yes Take 1-2 capsules (100-200 mg total) by mouth at bedtime. Myrlene Broker, MD Taking Active   ibuprofen (ADVIL) 600 MG tablet [id number] Yes Take 1 tablet (600 mg total) by mouth every 6 (six) hours as needed. Sabino Dick, DO Taking Active   Multiple Vitamins-Minerals (PRESERVISION AREDS PO) [id number] Yes Take 1 tablet by mouth daily. [provider] Taking Active Self  nirmatrelvir/ritonavir (PAXLOVID) 20 x 150 MG & 10 x 100MG  TABS [id number] Yes Take 3 tablets by mouth 2 (two) times daily for 5 days. (Take nirmatrelvir 150 mg two tablets twice daily for 5 days and ritonavir 100 mg one tablet twice daily for 5 days) Patient GFR is 68.72 Elenore Paddy, NP Taking Active   ondansetron (ZOFRAN) 4 MG tablet [id number]  Take 1 tablet (4 mg total) by mouth every 6 (six) hours. Wynetta Fines, MD  Active   SYRINGE-NEEDLE, DISP, 3 ML (BD SAFETYGLIDE SYRINGE/NEEDLE) 25G X 1" 3 ML MISC [id number]  Used to inject B12 Myrlene Broker, MD  Active Self            Home Care and Equipment/Supplies: Were Home Health Services Ordered?: NA Any new equipment or medical supplies ordered?: NA  Functional Questionnaire: Do you need assistance with bathing/showering or dressing?: No Do you need assistance with meal preparation?: No Do you need assistance with eating?: No Do you have difficulty maintaining continence: No Do you need assistance with getting out of bed/getting out of a chair/moving?: No Do you have difficulty managing or taking your  medications?: No  Follow up appointments reviewed: PCP Follow-up appointment confirmed?: Yes Date of PCP follow-up appointment?: 01/09/23 Follow-up Provider: Lorelee New Specialist [facility] Follow-up appointment confirmed?: NA Do you need transportation to your  follow-up appointment?: No Do you understand care options if your condition(s) worsen?: Yes-patient verbalized understanding  SDOH Interventions Today    Flowsheet Row Most Recent Value  SDOH Interventions   Food Insecurity Interventions Intervention Not Indicated  Transportation Interventions Intervention Not Indicated      TOC Interventions Today    Flowsheet Row Most Recent Value  TOC Interventions   TOC Interventions Discussed/Reviewed TOC Interventions Discussed      Interventions Today    Flowsheet Row Most Recent Value  General Interventions   General Interventions Discussed/Reviewed General Interventions Discussed, Doctor Visits, Vaccines  Vaccines COVID-19  Doctor Visits Discussed/Reviewed PCP, Doctor Visits Discussed  PCP/Specialist Visits Compliance with follow-up visit  Education Interventions   Education Provided Provided Education  Provided Verbal Education On Nutrition, When to see the doctor, Medication, Other  [sx mgmt]  Nutrition Interventions   Nutrition Discussed/Reviewed Nutrition Discussed  Pharmacy Interventions   Pharmacy Dicussed/Reviewed Pharmacy Topics Discussed, Medications and their functions  Safety Interventions   Safety Discussed/Reviewed Safety Discussed       Alessandra Grout [facility] Care Management Care Management Community Coordinator Direct Phone: [phone] Toll Free: [phone] Fax: [phone]

## 2023-01-13 NOTE — Progress Notes (Signed)
Left knee x-ray looks normal to radiology.  No fractures or severe arthritis.

## 2023-02-03 ENCOUNTER — Ambulatory Visit: Payer: Medicare PPO | Admitting: Internal Medicine

## 2023-02-03 VITALS — BP 102/60 | HR 74 | Temp 98.0°F | Ht 63.0 in | Wt 138.1 lb

## 2023-02-03 DIAGNOSIS — E785 Hyperlipidemia, unspecified: Secondary | ICD-10-CM

## 2023-02-03 DIAGNOSIS — E1169 Type 2 diabetes mellitus with other specified complication: Secondary | ICD-10-CM

## 2023-02-03 DIAGNOSIS — R1319 Other dysphagia: Secondary | ICD-10-CM | POA: Diagnosis not present

## 2023-02-03 DIAGNOSIS — K449 Diaphragmatic hernia without obstruction or gangrene: Secondary | ICD-10-CM | POA: Diagnosis not present

## 2023-02-03 MED ORDER — BACLOFEN 10 MG PO TABS: 10.0000 mg | ORAL_TABLET | Freq: Every evening | ORAL | 2 refills | Status: DC | PRN

## 2023-02-03 NOTE — Progress Notes (Unsigned)
   Subjective:   Patient ID: Erica [name], female    DOB: 1941-02-26, 82 y.o.   MRN: [id number]  HPI The patient is an 82 YO female coming in for SOB after eating, pressure after eating and some swallowing difficulty. Saw GI last year and they wanted to do EGD but she did not follow up due to fear. Wishes to pursue this now as she is having symptoms.   Review of Systems  Constitutional: Negative.   HENT:  Positive for trouble swallowing.   Eyes: Negative.   Respiratory:  Positive for shortness of breath. Negative for cough and chest tightness.   Cardiovascular:  Negative for chest pain, palpitations and leg swelling.  Gastrointestinal:  Positive for abdominal pain. Negative for abdominal distention, constipation, diarrhea, nausea and vomiting.  Musculoskeletal: Negative.   Skin: Negative.   Neurological: Negative.   Psychiatric/Behavioral: Negative.      Objective:  Physical Exam Constitutional:      Appearance: She is well-developed.  HENT:     Head: Normocephalic and atraumatic.  Cardiovascular:     Rate and Rhythm: Normal rate and regular rhythm.  Pulmonary:     Effort: Pulmonary effort is normal. No respiratory distress.     Breath sounds: Normal breath sounds. No wheezing or rales.  Abdominal:     General: Bowel sounds are normal. There is no distension.     Palpations: Abdomen is soft.     Tenderness: There is no abdominal tenderness. There is no rebound.  Musculoskeletal:     Cervical back: Normal range of motion.  Skin:    General: Skin is warm and dry.  Neurological:     Mental Status: She is alert and oriented to person, place, and time.     Coordination: Coordination normal.     Vitals:   02/03/23 1446  BP: 102/60  Pulse: 74  SpO2: 96%  Weight: 138 lb 2 oz (62.7 kg)  Height: 5\' 3"  (1.6 m)    Assessment & Plan:

## 2023-02-03 NOTE — Patient Instructions (Signed)
We will have you call GI again to see if they will do the endoscopy.

## 2023-02-05 ENCOUNTER — Encounter: Payer: Self-pay | Admitting: Internal Medicine

## 2023-02-05 NOTE — Assessment & Plan Note (Signed)
Needs to return to GI for EGD as symptoms are persistent and she is willing to do this now.

## 2023-02-05 NOTE — Assessment & Plan Note (Signed)
She is concerned about this causing pressure after eating and some SOB after eating. She would like to pursue EGD with GI and will send them message to see if they can direct to EGD schedule or if they need to see her back again prior to EGD.

## 2023-02-10 ENCOUNTER — Telehealth: Payer: Self-pay

## 2023-02-10 NOTE — Telephone Encounter (Signed)
Called patient to inform of needed OV due to having not been seen since 2023. Scheduled appt for OV with Colleen Kennedy-Smith,NP.  Patinet understood and agreed.

## 2023-02-10 NOTE — Telephone Encounter (Signed)
Alona Bene, please contact patient to schedule office visit with [facility] as recommended below. Thanks

## 2023-02-10 NOTE — Telephone Encounter (Signed)
-----   Message from Arnaldo Natal sent at 02/05/2023  4:29 PM EDT ----- Viviann Spare, pls contact patient and schedule her for an office appointment further discuss scheduling EGD as she was last seen in office 04/2022.  Thank you ----- Message ----- From: Myrlene Broker, MD Sent: 02/05/2023   8:31 AM EDT To: Arnaldo Natal, NP  This patient saw you last year and was supposed to get EGD which she did not follow up on. She is willing to do this now. Can she be directly scheduled for EGD or will she need another visit? Thanks Jarvis Newcomer

## 2023-03-04 ENCOUNTER — Other Ambulatory Visit (INDEPENDENT_AMBULATORY_CARE_PROVIDER_SITE_OTHER): Payer: Medicare PPO

## 2023-03-04 DIAGNOSIS — E1169 Type 2 diabetes mellitus with other specified complication: Secondary | ICD-10-CM | POA: Diagnosis not present

## 2023-03-04 DIAGNOSIS — E785 Hyperlipidemia, unspecified: Secondary | ICD-10-CM

## 2023-03-04 LAB — LIPID PANEL
Cholesterol: 189 mg/dL (ref 0–200)
HDL: 45 mg/dL (ref >39.00)
LDL Cholesterol: 123 mg/dL — ABNORMAL HIGH (ref 0–99)
NonHDL: 143.62
Total CHOL/HDL Ratio: 4
Triglycerides: 102 mg/dL (ref 0.0–149.0)
VLDL: 20.4 mg/dL (ref 0.0–40.0)

## 2023-03-05 ENCOUNTER — Other Ambulatory Visit: Payer: Self-pay | Admitting: Internal Medicine

## 2023-03-05 MED ORDER — PRAVASTATIN SODIUM 20 MG PO TABS: 20.0000 mg | ORAL_TABLET | Freq: Every day | ORAL | 3 refills | Status: DC

## 2023-03-06 ENCOUNTER — Other Ambulatory Visit: Payer: Self-pay | Admitting: Internal Medicine

## 2023-03-13 ENCOUNTER — Ambulatory Visit (INDEPENDENT_AMBULATORY_CARE_PROVIDER_SITE_OTHER): Payer: Medicare PPO

## 2023-03-13 VITALS — Ht 63.0 in | Wt 138.0 lb

## 2023-03-13 DIAGNOSIS — Z0000 Encounter for general adult medical examination without abnormal findings: Secondary | ICD-10-CM | POA: Diagnosis not present

## 2023-03-13 NOTE — Patient Instructions (Addendum)
Erica [name] , Thank you for taking time to come for your Medicare Wellness Visit. I appreciate your ongoing commitment to your health goals. Please review the following plan we discussed and let me know if I can assist you in the future.   Referrals/Orders/Follow-Ups/Clinician Recommendations:   This is a list of the screening recommended for you and due dates:  Health Maintenance  Topic Date Due   Pneumonia Vaccine (2 of 2 - PPSV23 or PCV20) 05/31/2014   DTaP/Tdap/Td vaccine (2 - Tdap) 06/25/2019   Flu Shot  01/23/2023   COVID-19 Vaccine (7 - 2023-24 season) 02/23/2023   Complete foot exam   04/02/2023   Hemoglobin A1C  06/18/2023   Eye exam for diabetics  09/11/2023   Yearly kidney health urinalysis for diabetes  12/17/2023   Yearly kidney function blood test for diabetes  01/07/2024   Medicare Annual Wellness Visit  03/12/2024   DEXA scan (bone density measurement)  Completed   HPV Vaccine  Aged Out   Zoster (Shingles) Vaccine  Discontinued    Advanced directives: (Copy Requested) Please bring a copy of your health care power of attorney and living will to the office to be added to your chart at your convenience.  Next Medicare Annual Wellness Visit scheduled for next year: Yes

## 2023-03-13 NOTE — Progress Notes (Signed)
Subjective:   Erica [name] is a 82 y.o. female who presents for Medicare Annual (Subsequent) preventive examination.  Visit Complete: Virtual  I connected with  Erica [name] on 03/13/23 by a audio enabled telemedicine application and verified that I am speaking with the correct person using two identifiers.  Patient Location: Home  Provider Location: Home Office  I discussed the limitations of evaluation and management by telemedicine. The patient expressed understanding and agreed to proceed.  Vital Signs: Unable to obtain new vitals due to this being a telehealth visit.   Cardiac Risk Factors include: advanced age (>40men, >62 women)     Objective:    Today's Vitals   03/13/23 1509  Weight: 138 lb (62.6 kg)  Height: 5\' 3"  (1.6 m)   Body mass index is 24.45 kg/m.     03/13/2023    3:18 PM 01/07/2023    3:08 PM 09/13/2022    3:59 PM 03/29/2022   11:25 AM 08/17/2020    6:23 PM 02/18/2020    1:33 PM 02/12/2019    1:09 PM  Advanced Directives  Does Patient Have a Medical Advance Directive? Yes Yes  Yes Yes Yes Yes  Type of Estate agent of [place];Living will Living will  Healthcare Power of [place];Living will Healthcare Power of [place];Living will Living will;Healthcare Power of [employer] Power of [place];Living will  Copy of Healthcare Power of Attorney in Chart? No - copy requested   No - copy requested  No - copy requested No - copy requested  Would patient like information on creating a medical advance directive?   Yes (ED - Information included in AVS);No - Patient declined        Current Medications (verified) Outpatient Encounter Medications as of 03/13/2023  Medication Sig   aspirin EC 81 MG tablet Take 1 tablet (81 mg total) by mouth daily. Swallow whole.   baclofen (LIORESAL) 10 MG tablet Take 1 tablet (10 mg total) by mouth at bedtime as needed for muscle spasms.   cetirizine (ZYRTEC) 10 MG tablet Take 10 mg by mouth  daily as needed for allergies.   cyanocobalamin (VITAMIN B12) 1000 MCG/ML injection INJECT 1 ML (1,000 MCG TOTAL) INTO THE MUSCLE EVERY 21 ( TWENTY-ONE) DAYS.   gabapentin (NEURONTIN) 100 MG capsule Take 1-2 capsules (100-200 mg total) by mouth at bedtime.   ibuprofen (ADVIL) 600 MG tablet Take 1 tablet (600 mg total) by mouth every 6 (six) hours as needed.   Multiple Vitamins-Minerals (PRESERVISION AREDS PO) Take 1 tablet by mouth daily.   ondansetron (ZOFRAN) 4 MG tablet Take 1 tablet (4 mg total) by mouth every 6 (six) hours.   pravastatin (PRAVACHOL) 20 MG tablet Take 1 tablet (20 mg total) by mouth daily.   SYRINGE-NEEDLE, DISP, 3 ML (BD SAFETYGLIDE SYRINGE/NEEDLE) 25G X 1" 3 ML MISC Used to inject B12   No facility-administered encounter medications on file as of 03/13/2023.    Allergies (verified) Augmentin [amoxicillin-pot clavulanate], Other, and Scallops [shellfish allergy]   History: Past Medical History:  Diagnosis Date   Allergic rhinitis    Arthritis    Asthma    Diet-controlled type 2 diabetes mellitus (HCC) 09/2011 dx   Diverticulosis    Dyslipidemia    Macular degeneration    genetic etiology, not age-related per retinal specailist   Recurrent kidney stones    Past Surgical History:  Procedure Laterality Date   CATARACT EXTRACTION Bilateral 01/2015   L on 8/8, R on 8/22  DILATION AND CURETTAGE OF UTERUS     TONSILLECTOMY AND ADENOIDECTOMY     Family History  Problem Relation Age of Onset   Heart disease Father    Hyperlipidemia Father    Hypertension Father    Colon cancer Mother    Arthritis Mother    Cancer Mother        colon cancer   Diabetes Brother    Hyperlipidemia Son    Diabetes Paternal Aunt    Social History   Socioeconomic History   Marital status: Married    Spouse name: Not on file   Number of children: 2   Years of education: Not on file   Highest education level: Doctorate  Occupational History   Occupation: Retired  Tobacco Use    Smoking status: Never   Smokeless tobacco: Never   Tobacco comments:    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[place] but in [place] since 1988  Vaping Use   Vaping status: Never Used  Substance and Sexual Activity   Alcohol use: Yes    Alcohol/week: 3.0 standard drinks of alcohol    Types: 3 Glasses of wine per week    Comment: can't drink red wine anymore; on glass of white wine rarely   Drug use: No   Sexual activity: Yes  Other Topics Concern   Not on file  Social History Narrative   Regular exercise-yes   Caffeine Use-yes               Epworth Sleepiness Scale = 5 (as of 07/04/2015)   Social Determinants of Health   Financial Resource Strain: Low Risk  (03/13/2023)   Overall Financial Resource Strain (CARDIA)    Difficulty of Paying Living Expenses: Not hard at all  Food Insecurity: No Food Insecurity (03/13/2023)   Hunger Vital Sign    Worried About Running Out of Food in the Last Year: Never true    Ran Out of Food in the Last Year: Never true  Transportation Needs: No Transportation Needs (01/10/2023)   PRAPARE - Administrator, Civil Service (Medical): No    Lack of Transportation (Non-Medical): No  Physical Activity: Sufficiently Active (03/13/2023)   Exercise Vital Sign    Days of Exercise per Week: 5 days    Minutes of Exercise per Session: 30 min  Stress: No Stress Concern Present (03/13/2023)   [employer] of Occupational Health - Occupational Stress Questionnaire    Feeling of Stress : Not at all  Social Connections: Socially Integrated (03/13/2023)   Social Connection and Isolation Panel [NHANES]    Frequency of Communication with Friends and Family: More than three times a week    Frequency of Social Gatherings with Friends and Family: More than three times a week    Attends Religious Services: More than 4 times per year    Active Member of [employer] or Organizations: Yes    Attends Hospital doctor: More than 4 times per year    Marital Status: Married    Tobacco Counseling Counseling given: Not Answered Tobacco comments: 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 1 0  Comment Wears hearing aids   Vision? 0 0  Difficulty concentrating or making decisions? 0 0  Walking or climbing stairs? 0 0  Dressing or bathing? 0 0  Doing errands, shopping? 0 0  Preparing Food and eating ? N N  Using the Toilet? N N  In the past six months, have you accidently leaked urine? N N  Do you have problems with loss of bowel control? N N  Managing your Medications? N N  Managing your Finances? N N  Housekeeping or managing your Housekeeping? N N    Patient Care Team: Myrlene Broker, MD as PCP - General (Internal Medicine) Ernesto Rutherford, MD (Ophthalmology) Luciana Axe Alford Highland, MD as Consulting Physician (Ophthalmology) [facility]  Indicate any recent Medical Services you may have received from other than [facility] providers in the past year (date may be approximate).     Assessment:   This is a routine wellness examination for Erica [name].  Hearing/Vision screen Hearing Screening - Comments:: Wears hearing aids Vision Screening - Comments:: Wears rx glasses - up to date with routine eye exams with  Dr  Dione Booze   Goals Addressed               This Visit's Progress     Patient Stated (pt-stated)        Stay as healthy and independent as possible. Enjoy life, family and friends.       Depression Screen    03/13/2023    3:14 PM 02/03/2023    2:47 PM 12/17/2022    1:43 PM 09/16/2022    1:54 PM 03/29/2022   11:24 AM 12/07/2021    2:55 PM 05/16/2020   11:00 AM  PHQ 2/9 Scores  PHQ - 2 Score 0 0 0 0 0 0 0  PHQ- 9 Score 0 0 0 0       Fall Risk    03/13/2023    3:17 PM 02/03/2023    2:47 PM 12/17/2022    1:42 PM 09/16/2022    1:54 PM 03/29/2022   11:21 AM  Fall Risk   Falls in the past year? 0 0 0 0 0  Number falls in past yr: 0 0 0 0 0  Injury with Fall? 0 0 0 0 0  Risk for fall due to : No Fall Risks History of fall(s)   No Fall Risks  Follow up Falls prevention discussed Falls evaluation completed Falls evaluation completed Falls evaluation completed Falls prevention discussed    MEDICARE RISK AT HOME: Medicare Risk at Home Any stairs in or around the home?: No If so, are there any without handrails?: No Home free of loose throw rugs in walkways, pet beds, electrical cords, etc?: Yes Adequate lighting in your home to reduce risk of falls?: Yes Life alert?: No Use of a cane, walker or w/c?: No Grab bars in the bathroom?: Yes Shower chair or bench in shower?: Yes Elevated toilet seat or a handicapped toilet?: Yes  TIMED UP AND GO:  Was the test performed?  No    Cognitive Function:  03/13/2023    3:18 PM 03/29/2022   11:26 AM  6CIT Screen  What Year? 0 points 0 points  What month? 0 points 0 points  What time? 0 points 0 points  Count back from 20 0 points 0 points  Months in reverse 0 points 0 points  Repeat phrase 0 points 0 points  Total Score 0 points 0 points    Immunizations Immunization History  Administered Date(s) Administered   Fluad Quad(high Dose 65+) 04/08/2019, 04/20/2020, 04/12/2022   Hepatitis A, Adult 02/24/2013   Influenza Split  03/08/2014   Influenza, High Dose Seasonal PF 04/18/2015, 03/19/2017   Influenza,inj,Quad PF,6+ Mos 02/24/2013   Influenza-Unspecified 04/03/2016, 04/02/2018   PFIZER Comirnaty(Gray Top)Covid-19 Tri-Sucrose Vaccine 08/15/2020, 01/11/2021, 03/15/2022   PFIZER(Purple Top)SARS-COV-2 Vaccination 07/29/2019, 08/24/2019   Pfizer Covid-19 Vaccine Bivalent Booster 53yrs & up 05/22/2021   Pneumococcal Conjugate-13 05/31/2013   Pneumococcal-Unspecified 09/22/2008   Rsv, Bivalent, Protein Subunit Rsvpref,pf Verdis Frederickson) 02/07/2022   Td 06/24/2009   Zoster Recombinant(Shingrix) 12/04/2016   Zoster, Live 09/09/2007    TDAP status: Due, Education has been provided regarding the importance of this vaccine. Advised may receive this vaccine at local pharmacy or Health Dept. Aware to provide a copy of the vaccination record if obtained from local pharmacy or Health Dept. Verbalized acceptance and understanding.  Flu Vaccine status: Due, Education has been provided regarding the importance of this vaccine. Advised may receive this vaccine at local pharmacy or Health Dept. Aware to provide a copy of the vaccination record if obtained from local pharmacy or Health Dept. Verbalized acceptance and understanding.  Pneumococcal vaccine status: Due, Education has been provided regarding the importance of this vaccine. Advised may receive this vaccine at local pharmacy or Health Dept. Aware to provide a copy of the vaccination record if obtained from local pharmacy or Health Dept. Verbalized acceptance and understanding.  Covid-19 vaccine status: Declined, Education has been provided regarding the importance of this vaccine but patient still declined. Advised may receive this vaccine at local pharmacy or Health Dept.or vaccine clinic. Aware to provide a copy of the vaccination record if obtained from local pharmacy or Health Dept. Verbalized acceptance and understanding.    Screening Tests Health Maintenance  Topic  Date Due   Pneumonia Vaccine 24+ Years old (2 of 2 - PPSV23 or PCV20) 05/31/2014   DTaP/Tdap/Td (2 - Tdap) 06/25/2019   INFLUENZA VACCINE  01/23/2023   COVID-19 Vaccine (7 - 2023-24 season) 02/23/2023   FOOT EXAM  04/02/2023   HEMOGLOBIN A1C  06/18/2023   OPHTHALMOLOGY EXAM  09/11/2023   Diabetic kidney evaluation - Urine ACR  12/17/2023   Diabetic kidney evaluation - eGFR measurement  01/07/2024   Medicare Annual Wellness (AWV)  03/12/2024   DEXA SCAN  Completed   HPV VACCINES  Aged Out   Zoster Vaccines- Shingrix  Discontinued    Health Maintenance  Health Maintenance Due  Topic Date Due   Pneumonia Vaccine 36+ Years old (2 of 2 - PPSV23 or PCV20) 05/31/2014   DTaP/Tdap/Td (2 - Tdap) 06/25/2019   INFLUENZA VACCINE  01/23/2023   COVID-19 Vaccine (7 - 2023-24 season) 02/23/2023        Bone Density status: Ordered 03/29/22. Pt provided with contact info and advised to call to schedule appt.    Additional Screening:    Vision Screening: Recommended annual ophthalmology exams for early detection of glaucoma and other disorders of the eye. Is the patient up to date with their annual eye exam?  Yes  Who is the provider or what is the name of the office in which the patient attends annual eye exams? Dr Dione Booze If pt is not established with a provider, would they like to be referred to a provider to establish care? No .   Dental Screening: Recommended annual dental exams for proper oral hygiene    Community Resource Referral / Chronic Care Management:  CRR required this visit?  No   CCM required this visit?  No     Plan:     I have personally reviewed and noted the following in the patient's chart:   Medical and social history Use of alcohol, tobacco or illicit drugs  Current medications and supplements including opioid prescriptions. Patient is not currently taking opioid prescriptions. Functional ability and status Nutritional status Physical  activity Advanced directives List of other physicians Hospitalizations, surgeries, and ER visits in previous 12 months Vitals Screenings to include cognitive, depression, and falls Referrals and appointments  In addition, I have reviewed and discussed with patient certain preventive protocols, quality metrics, and best practice recommendations. A written personalized care plan for preventive services as well as general preventive health recommendations were provided to patient.     Tillie Rung, LPN   1/61/0960   After Visit Summary: (MyChart) Due to this being a telephonic visit, the after visit summary with patients personalized plan was offered to patient via MyChart   Nurse Notes: None

## 2023-03-18 ENCOUNTER — Other Ambulatory Visit: Payer: Self-pay | Admitting: Internal Medicine

## 2023-03-27 ENCOUNTER — Ambulatory Visit
Admission: RE | Admit: 2023-03-27 | Discharge: 2023-03-27 | Disposition: A | Discharge status: Home / Self Care | Payer: Medicare PPO | Source: Ambulatory Visit | Attending: Internal Medicine | Admitting: Internal Medicine

## 2023-03-27 DIAGNOSIS — Z780 Asymptomatic menopausal state: Secondary | ICD-10-CM

## 2023-03-31 ENCOUNTER — Other Ambulatory Visit: Payer: Self-pay

## 2023-03-31 ENCOUNTER — Telehealth: Payer: Self-pay | Admitting: Internal Medicine

## 2023-03-31 MED ORDER — GABAPENTIN 100 MG PO CAPS: 100.0000 mg | ORAL_CAPSULE | Freq: Every day | ORAL | 3 refills | Status: DC

## 2023-03-31 NOTE — Telephone Encounter (Signed)
Prescription Request  03/31/2023  LOV: 02/03/2023  What is the name of the medication or equipment? gabapentin (NEURONTIN) 100 MG capsule   Have you contacted your pharmacy to request a refill? No  [id number] [address] [place] [address] Phone: [phone] Fax: [phone]   Which pharmacy would you like this sent to? Patient notified that their request is being sent to the clinical staff for review and that they should receive a response within 2 business days.   Please advise at Mobile [phone] (mobile)

## 2023-04-15 ENCOUNTER — Encounter: Payer: Self-pay | Admitting: Internal Medicine

## 2023-04-15 ENCOUNTER — Ambulatory Visit: Payer: Medicare PPO | Attending: Internal Medicine | Admitting: Internal Medicine

## 2023-04-15 VITALS — BP 122/66 | HR 63 | Ht 64.0 in | Wt 140.0 lb

## 2023-04-15 DIAGNOSIS — I2510 Atherosclerotic heart disease of native coronary artery without angina pectoris: Secondary | ICD-10-CM

## 2023-04-15 NOTE — Progress Notes (Signed)
Cardiology Office Note   Date:  04/15/2023   ID:  Rogina, Paque 09-01-1940, MRN [id number]  PCP:  Myrlene Broker, MD  Cardiologist:   Dietrich Pates, MD   Pt presents for eval of CAD    History of Present Illness: Erica [name] is a 82 y.o. female with a history of dyslipidemia and CP   She had a negative myoview in 2017 and again in 2019.   She is followed by Nanda Quinton   Had a Ca score done in Oct 2022   Ca score was 181  (56% ile for age)  I saw her in clinic in Dec 2022     I saw the pt in Sept 2023   At that visit I recomm Crestor  She took it and had a good response to lipids with marked drop in LDL, LDL particle number   She stopped it though due to achiness    She was given Rx for pravastatin but did not start it   Does not want to take it      Pt denies CP   Fairly active   Will get SOB after she eats a meal    No SOB with activity   has a hiatal hernia   Going to see GI soon Also notes occasional palpitations after meal Also has neuropathy in feet  Worse at night   Current Meds  Medication Sig   aspirin EC 81 MG tablet Take 1 tablet (81 mg total) by mouth daily. Swallow whole.   baclofen (LIORESAL) 10 MG tablet Take 1 tablet (10 mg total) by mouth at bedtime as needed for muscle spasms.   cetirizine (ZYRTEC) 10 MG tablet Take 10 mg by mouth daily as needed for allergies.   cyanocobalamin (VITAMIN B12) 1000 MCG/ML injection INJECT 1 ML (1,000 MCG TOTAL) INTO THE MUSCLE EVERY 21 ( TWENTY-ONE) DAYS.   gabapentin (NEURONTIN) 100 MG capsule Take 1-2 capsules (100-200 mg total) by mouth at bedtime.   ibuprofen (ADVIL) 600 MG tablet Take 1 tablet (600 mg total) by mouth every 6 (six) hours as needed.   Multiple Vitamins-Minerals (PRESERVISION AREDS PO) Take 1 tablet by mouth daily.   SYRINGE-NEEDLE, DISP, 3 ML (BD SAFETYGLIDE SYRINGE/NEEDLE) 25G X 1" 3 ML MISC Used to inject B12     Allergies:   Augmentin [amoxicillin-pot clavulanate], Other, and Scallops  [shellfish allergy]   Past Medical History:  Diagnosis Date   Allergic rhinitis    Arthritis    Asthma    Diet-controlled type 2 diabetes mellitus (HCC) 09/2011 dx   Diverticulosis    Dyslipidemia    Macular degeneration    genetic etiology, not age-related per retinal specailist   Recurrent kidney stones     Past Surgical History:  Procedure Laterality Date   CATARACT EXTRACTION Bilateral 01/2015   L on 8/8, R on 8/22   DILATION AND CURETTAGE OF UTERUS     TONSILLECTOMY AND ADENOIDECTOMY       Social History:  The patient  reports that she has never smoked. She has never used smokeless tobacco. She reports current alcohol use of about 3.0 standard drinks of alcohol per week. She reports that she does not use drugs.   Family History:  The patient's family history includes Arthritis in her mother; Cancer in her mother; Colon cancer in her mother; Diabetes in her brother and paternal aunt; Heart disease in her father; Hyperlipidemia in her father and son; Hypertension in her  father.    ROS:  Please see the history of present illness. All other systems are reviewed and  Negative to the above problem except as noted.    PHYSICAL EXAM: VS:  BP 122/66   Pulse 63   Ht 5\' 4"  (1.626 m)   Wt 140 lb (63.5 kg)   SpO2 95%   BMI 24.03 kg/m   GEN: Well nourished, well developed, in no acute distress  HEENT: normal  Neck: no JVD, no carotid bruit Cardiac: RRR; no murmur  No LE  edema  Respiratory:  clear to auscultation  GI: soft, nontender No hepatomegaly    EKG:  EKG is not ordered    Lipid Panel    Component Value Date/Time   CHOL 189 03/04/2023 0925   CHOL 129 10/05/2021 0902   TRIG 102.0 03/04/2023 0925   HDL 45.00 03/04/2023 0925   HDL 49 10/05/2021 0902   CHOLHDL 4 03/04/2023 0925   VLDL 20.4 03/04/2023 0925   LDLCALC 123 (H) 03/04/2023 0925   LDLCALC 67 10/05/2021 0902   LDLDIRECT 113.0 11/27/2016 1514      Wt Readings from Last 3 Encounters:  04/15/23 140 lb  (63.5 kg)  03/13/23 138 lb (62.6 kg)  02/03/23 138 lb 2 oz (62.7 kg)      ASSESSMENT AND PLAN:  1  CAD   Pt with Ca score 181, 56th percentile for age   On ASA   Not on statin     I have discussed with her   With age, I think she would get the most benefit from working on diet, activity   Her A1C is elevated at 6.3%    She denies CP  Breathing is good   Keep on Asa  2  Palpitations   Occurred after RSV vaccine  Pt currently denies      3  Dyslipidemia    I would follow up lipomed in the spring    Recomm minimally processed foods       Discussed pravastatin (lowers cholesterol, stabilizes plaque)  Risk of SE unknown compared to Crestor      Pt would liketo try diet first     4  Metabolics  A1c is 6.3  Pt says she does not eat sweets  Cut back on carbs   Stay active          Current medicines are reviewed at length with the patient today.  The patient does not have concerns regarding medicines.  Signed, Dietrich Pates, MD  04/15/2023 2:24 PM    [facility] [address] [place], [place], [place]  [address] Phone: [phone]; Fax: [phone]

## 2023-04-15 NOTE — Patient Instructions (Signed)
Medication Instructions:   *If you need a refill on your cardiac medications before your next appointment, please call your pharmacy*   Lab Work:  If you have labs (blood work) drawn today and your tests are completely normal, you will receive your results only by: MyChart Message (if you have MyChart) OR A paper copy in the mail If you have any lab test that is abnormal or we need to change your treatment, we will call you to review the results.   Testing/Procedures:    Follow-Up: At [facility], you and your health needs are our priority.  As part of our continuing mission to provide you with exceptional heart care, we have created designated Provider Care Teams.  These Care Teams include your primary Cardiologist (physician) and Advanced Practice Providers (APPs -  Physician Assistants and Nurse Practitioners) who all work together to provide you with the care you need, when you need it.  We recommend signing up for the patient portal called "MyChart".  Sign up information is provided on this After Visit Summary.  MyChart is used to connect with patients for Virtual Visits (Telemedicine).  Patients are able to view lab/test results, encounter notes, upcoming appointments, etc.  Non-urgent messages can be sent to your provider as well.   To learn more about what you can do with MyChart, go to [email or web address].    Your next appointment:  one year

## 2023-04-30 NOTE — Progress Notes (Unsigned)
     04/30/2023 Erica [name] [id number] 08/28/1940   Chief Complaint:  History of Present Illness: Erica [name] is an 82 year old female with a past medical history of arthritis, asthma, hyperlipidemia, past DM II (patient stated she does not have diabetes), macular degeneration and colon polyps.   She reported undergoing a past through cardiac evaluation by Dr. Tenny Craw due to having SOB, chest pain  and palpitations. She underwent a negative myoview in 2017 and in 2019. She underwent a coronary CT 03/2021 which showed a calcium  score of 181 treated with ASA and Crestor. She underwent a zio patch study due to having palpitations 03/06/2022 which showed a sinus rhythm, average HR 79, with rare PAC and PVC. No arrhythmias detected. Palpitations occurred after she received RSV vaccination. ECHO 04/25/2022 showed a LV EF of 60 -65%. She was prescribed PPI by her PCP but she did not wish to take it at that time.   Colonoscopy 01/14/2012: 1) Two polyps 2) Mild diverticulosis  3) Otherwise normal examination, s/p random biopsies to r/o microscopic colitis  1. Surgical [P], cecum, ascending, polyps - TUBULAR ADENOMAS (MULTIPLE FRAGMENTS). - HIGH GRADE DYSPLASIA IS NOT IDENTIFIED. 2. Surgical [P], random colon - BENIGN COLONIC MUCOSA. - NO SIGNIFICANT INFLAMMATION OR OTHER ABNORMALITIES IDENTIFIED.   Colonoscopy in 2005 and 1997 showed mild diverticulosis of the left colon.   Current Medications, Allergies, Past Medical History, Past Surgical History, Family History and Social History were reviewed in [employer] record.   Review of Systems:   Constitutional: Negative for fever, sweats, chills or weight loss.  Respiratory: Negative for shortness of breath.   Cardiovascular: Negative for chest pain, palpitations and leg swelling.  Gastrointestinal: See HPI.  Musculoskeletal: Negative for back pain or muscle aches.  Neurological: Negative for dizziness, headaches or  paresthesias.    Physical Exam: There were no vitals taken for this visit. General: in no acute distress. Head: Normocephalic and atraumatic. Eyes: No scleral icterus. Conjunctiva pink . Ears: Normal auditory acuity. Mouth: Dentition intact. No ulcers or lesions.  Lungs: Clear throughout to auscultation. Heart: Regular rate and rhythm, no murmur. Abdomen: Soft, nontender and nondistended. No masses or hepatomegaly. Normal bowel sounds x 4 quadrants.  Rectal: Deferred.  Musculoskeletal: Symmetrical with no gross deformities. Extremities: No edema. Neurological: Alert oriented x 4. No focal deficits.  Psychological: Alert and cooperative. Normal mood and affect  Assessment and Recommendations: ***

## 2023-05-01 ENCOUNTER — Encounter: Payer: Self-pay | Admitting: Nurse Practitioner

## 2023-05-01 ENCOUNTER — Ambulatory Visit: Payer: Medicare PPO | Admitting: Nurse Practitioner

## 2023-05-01 VITALS — BP 98/64 | HR 63 | Ht 64.0 in | Wt 138.0 lb

## 2023-05-01 DIAGNOSIS — R1310 Dysphagia, unspecified: Secondary | ICD-10-CM

## 2023-05-01 NOTE — Progress Notes (Signed)
Agree with assessment/plan. D/t age >66, lets hold off on screening colonoscopies. Certainly, if she starts having any new lower GI problems, we would reconsider.  Edman Circle, MD [facility] [phone]

## 2023-05-01 NOTE — Patient Instructions (Addendum)
You have been scheduled for an endoscopy. Please follow written instructions given to you at your visit today.  If you use inhalers (even only as needed), please bring them with you on the day of your procedure. ___________________________________________________________________________  Pepcid 20 mg (over the counter)- take 1 by mouth daily as needed  Due to recent changes in healthcare laws, you may see the results of your imaging and laboratory studies on MyChart before your provider has had a chance to review them.  We understand that in some cases there may be results that are confusing or concerning to you. Not all laboratory results come back in the same time frame and the provider may be waiting for multiple results in order to interpret others.  Please give [place] 48 hours in order for your provider to thoroughly review all the results before contacting the office for clarification of your results.   Thank you for trusting me with your gastrointestinal care!   Alcide Evener, CRNP

## 2023-07-11 ENCOUNTER — Encounter: Payer: Medicare PPO | Admitting: Gastroenterology

## 2023-07-31 DIAGNOSIS — L723 Sebaceous cyst: Secondary | ICD-10-CM | POA: Diagnosis not present

## 2023-07-31 DIAGNOSIS — L218 Other seborrheic dermatitis: Secondary | ICD-10-CM | POA: Diagnosis not present

## 2023-07-31 DIAGNOSIS — L821 Other seborrheic keratosis: Secondary | ICD-10-CM | POA: Diagnosis not present

## 2023-07-31 DIAGNOSIS — L570 Actinic keratosis: Secondary | ICD-10-CM | POA: Diagnosis not present

## 2023-07-31 DIAGNOSIS — D2272 Melanocytic nevi of left lower limb, including hip: Secondary | ICD-10-CM | POA: Diagnosis not present

## 2023-07-31 DIAGNOSIS — D225 Melanocytic nevi of trunk: Secondary | ICD-10-CM | POA: Diagnosis not present

## 2023-08-13 ENCOUNTER — Other Ambulatory Visit: Payer: Self-pay | Admitting: Internal Medicine

## 2023-08-15 ENCOUNTER — Ambulatory Visit: Payer: Medicare PPO | Admitting: Internal Medicine

## 2023-08-15 ENCOUNTER — Encounter: Payer: Self-pay | Admitting: Internal Medicine

## 2023-08-15 VITALS — BP 100/60 | HR 84 | Temp 98.5°F | Ht 64.0 in | Wt 139.0 lb

## 2023-08-15 DIAGNOSIS — J069 Acute upper respiratory infection, unspecified: Secondary | ICD-10-CM

## 2023-08-15 MED ORDER — PROMETHAZINE-DM 6.25-15 MG/5ML PO SYRP: 5.0000 mL | ORAL_SOLUTION | Freq: Four times a day (QID) | ORAL | 0 refills | Status: DC | PRN

## 2023-08-15 MED ORDER — AZITHROMYCIN 250 MG PO TABS: ORAL_TABLET | ORAL | 0 refills | Status: AC

## 2023-08-15 NOTE — Assessment & Plan Note (Signed)
 Likely flu but outside window for treatment so testing not done. Rx promethazine/dm for cough. Rx azithromycin to start in 3 days if no improvement or if worsening only.

## 2023-08-15 NOTE — Progress Notes (Signed)
   Subjective:   Patient ID: Erica [name], female    DOB: 1941-04-27, 83 y.o.   MRN: [id number]  URI  Associated symptoms include congestion, coughing and rhinorrhea. Pertinent negatives include no ear pain, sinus pain, sneezing, sore throat or wheezing.   The patient is an 83 YO female coming in for possible laryngitis started 4-5 days ago. Fever in the last few days.  Review of Systems  Constitutional:  Positive for activity change, appetite change and fever. Negative for chills, fatigue and unexpected weight change.  HENT:  Positive for congestion, postnasal drip, rhinorrhea and sinus pressure. Negative for ear discharge, ear pain, sinus pain, sneezing, sore throat, tinnitus, trouble swallowing and voice change.   Eyes: Negative.   Respiratory:  Positive for cough. Negative for chest tightness, shortness of breath and wheezing.   Cardiovascular: Negative.   Gastrointestinal: Negative.   Musculoskeletal:  Positive for myalgias.  Neurological: Negative.     Objective:  Physical Exam Constitutional:      Appearance: She is well-developed.  HENT:     Head: Normocephalic and atraumatic.     Comments: Oropharynx with redness and clear drainage, nose with swollen turbinates, TMs normal bilaterally.  Neck:     Thyroid: No thyromegaly.  Cardiovascular:     Rate and Rhythm: Normal rate and regular rhythm.  Pulmonary:     Effort: Pulmonary effort is normal. No respiratory distress.     Breath sounds: Normal breath sounds. No wheezing or rales.  Abdominal:     Palpations: Abdomen is soft.  Musculoskeletal:        General: No tenderness.     Cervical back: Normal range of motion.  Lymphadenopathy:     Cervical: No cervical adenopathy.  Skin:    General: Skin is warm and dry.  Neurological:     Mental Status: She is alert and oriented to person, place, and time.     Vitals:   08/15/23 1150  BP: 100/60  Pulse: 84  Temp: 98.5 F (36.9 C)  TempSrc: Oral  SpO2: 98%  Weight:  139 lb (63 kg)  Height: 5\' 4"  (1.626 m)    Assessment & Plan:

## 2023-08-15 NOTE — Patient Instructions (Signed)
 We have sent in the z-pack to start taking in 2-3 days if not improving.  If needed take 2 pills on day 1, then 1 pill daily on days 2-5.

## 2023-08-28 DIAGNOSIS — Z0184 Encounter for antibody response examination: Secondary | ICD-10-CM | POA: Diagnosis not present

## 2023-09-03 ENCOUNTER — Ambulatory Visit: Admitting: Internal Medicine

## 2023-09-10 DIAGNOSIS — H26492 Other secondary cataract, left eye: Secondary | ICD-10-CM | POA: Diagnosis not present

## 2023-09-10 DIAGNOSIS — Z961 Presence of intraocular lens: Secondary | ICD-10-CM | POA: Diagnosis not present

## 2023-09-10 DIAGNOSIS — H40013 Open angle with borderline findings, low risk, bilateral: Secondary | ICD-10-CM | POA: Diagnosis not present

## 2023-09-10 DIAGNOSIS — H3554 Dystrophies primarily involving the retinal pigment epithelium: Secondary | ICD-10-CM | POA: Diagnosis not present

## 2023-09-10 LAB — HM DIABETES EYE EXAM

## 2023-09-23 ENCOUNTER — Ambulatory Visit: Admitting: Internal Medicine

## 2023-09-23 ENCOUNTER — Encounter: Payer: Self-pay | Admitting: Internal Medicine

## 2023-09-23 VITALS — BP 118/80 | HR 77 | Temp 98.0°F | Ht 64.0 in | Wt 142.0 lb

## 2023-09-23 DIAGNOSIS — E785 Hyperlipidemia, unspecified: Secondary | ICD-10-CM | POA: Diagnosis not present

## 2023-09-23 DIAGNOSIS — E1169 Type 2 diabetes mellitus with other specified complication: Secondary | ICD-10-CM | POA: Diagnosis not present

## 2023-09-23 DIAGNOSIS — G720 Drug-induced myopathy: Secondary | ICD-10-CM | POA: Insufficient documentation

## 2023-09-23 DIAGNOSIS — T466X5A Adverse effect of antihyperlipidemic and antiarteriosclerotic drugs, initial encounter: Secondary | ICD-10-CM | POA: Diagnosis not present

## 2023-09-23 DIAGNOSIS — E119 Type 2 diabetes mellitus without complications: Secondary | ICD-10-CM

## 2023-09-23 LAB — HEMOGLOBIN A1C: Hgb A1c MFr Bld: 6.3 % (ref 4.6–6.5)

## 2023-09-23 LAB — COMPREHENSIVE METABOLIC PANEL WITH GFR
ALT: 16 U/L (ref 0–35)
AST: 15 U/L (ref 0–37)
Albumin: 4 g/dL (ref 3.5–5.2)
Alkaline Phosphatase: 68 U/L (ref 39–117)
BUN: 17 mg/dL (ref 6–23)
CO2: 28 meq/L (ref 19–32)
Calcium: 9 mg/dL (ref 8.4–10.5)
Chloride: 106 meq/L (ref 96–112)
Creatinine, Ser: 0.82 mg/dL (ref 0.40–1.20)
GFR: 65.36 mL/min (ref >60.00)
Glucose, Bld: 109 mg/dL — ABNORMAL HIGH (ref 70–99)
Potassium: 4.3 meq/L (ref 3.5–5.1)
Sodium: 140 meq/L (ref 135–145)
Total Bilirubin: 0.2 mg/dL (ref 0.2–1.2)
Total Protein: 6.4 g/dL (ref 6.0–8.3)

## 2023-09-23 LAB — LIPID PANEL
Cholesterol: 202 mg/dL — ABNORMAL HIGH (ref 0–200)
HDL: 41.9 mg/dL (ref >39.00)
LDL Cholesterol: 92 mg/dL (ref 0–99)
NonHDL: 159.73
Total CHOL/HDL Ratio: 5
Triglycerides: 339 mg/dL — ABNORMAL HIGH (ref 0.0–149.0)
VLDL: 67.8 mg/dL — ABNORMAL HIGH (ref 0.0–40.0)

## 2023-09-23 NOTE — Assessment & Plan Note (Signed)
 Recently tried pravastatin and got severe myopathy. She has tried and failed 3-4 statins over the years.

## 2023-09-23 NOTE — Progress Notes (Signed)
   Subjective:   Patient ID: Erica [name], female    DOB: 06/19/1941, 83 y.o.   MRN: [id number]  Hyperlipidemia Pertinent negatives include no chest pain or shortness of breath.   The patient is an 83 YO female coming in for follow up cholesterol. Did try pravastatin but was unable to tolerate. She has failed tolerance of 3-4 different statins over the years gets severe muscle aches.   Review of Systems  Constitutional: Negative.   HENT: Negative.    Eyes: Negative.   Respiratory:  Negative for cough, chest tightness and shortness of breath.   Cardiovascular:  Negative for chest pain, palpitations and leg swelling.  Gastrointestinal:  Negative for abdominal distention, abdominal pain, constipation, diarrhea, nausea and vomiting.  Musculoskeletal: Negative.   Skin: Negative.   Neurological: Negative.   Psychiatric/Behavioral: Negative.      Objective:  Physical Exam Constitutional:      Appearance: She is well-developed.  HENT:     Head: Normocephalic and atraumatic.  Cardiovascular:     Rate and Rhythm: Normal rate and regular rhythm.  Pulmonary:     Effort: Pulmonary effort is normal. No respiratory distress.     Breath sounds: Normal breath sounds. No wheezing or rales.  Abdominal:     General: Bowel sounds are normal. There is no distension.     Palpations: Abdomen is soft.     Tenderness: There is no abdominal tenderness. There is no rebound.  Musculoskeletal:     Cervical back: Normal range of motion.  Skin:    General: Skin is warm and dry.  Neurological:     Mental Status: She is alert and oriented to person, place, and time.     Coordination: Coordination normal.     Vitals:   09/23/23 1322  BP: 118/80  Pulse: 77  Temp: 98 F (36.7 C)  TempSrc: Oral  SpO2: 97%  Weight: 142 lb (64.4 kg)  Height: 5\' 4"  (1.626 m)    Assessment & Plan:

## 2023-09-23 NOTE — Assessment & Plan Note (Signed)
 Checking lipid panel. Checking calcium score to see if there is progression of plaque. If so she is willing to consider praluent or repatha.

## 2023-09-23 NOTE — Assessment & Plan Note (Signed)
 Checking HgA1c since doing labs today.

## 2023-09-23 NOTE — Patient Instructions (Signed)
 We will check the labs and the calcium score.  If the calcium score is changed we will try praluent or repatha.

## 2023-10-03 ENCOUNTER — Ambulatory Visit (HOSPITAL_COMMUNITY)
Admission: RE | Admit: 2023-10-03 | Discharge: 2023-10-03 | Disposition: A | Discharge status: Home / Self Care | Payer: Self-pay | Source: Ambulatory Visit | Attending: Internal Medicine | Admitting: Internal Medicine

## 2023-10-03 DIAGNOSIS — E1169 Type 2 diabetes mellitus with other specified complication: Secondary | ICD-10-CM | POA: Insufficient documentation

## 2023-10-03 DIAGNOSIS — E785 Hyperlipidemia, unspecified: Secondary | ICD-10-CM | POA: Insufficient documentation

## 2023-10-06 ENCOUNTER — Encounter: Payer: Self-pay | Admitting: Internal Medicine

## 2023-10-21 ENCOUNTER — Encounter: Payer: Self-pay | Admitting: Internal Medicine

## 2023-10-21 ENCOUNTER — Ambulatory Visit: Admitting: Internal Medicine

## 2023-10-21 VITALS — BP 118/68 | HR 71 | Temp 97.7°F | Ht 64.0 in | Wt 145.0 lb

## 2023-10-21 DIAGNOSIS — R29898 Other symptoms and signs involving the musculoskeletal system: Secondary | ICD-10-CM

## 2023-10-21 DIAGNOSIS — E785 Hyperlipidemia, unspecified: Secondary | ICD-10-CM | POA: Diagnosis not present

## 2023-10-21 DIAGNOSIS — E1169 Type 2 diabetes mellitus with other specified complication: Secondary | ICD-10-CM | POA: Diagnosis not present

## 2023-10-21 MED ORDER — PRALUENT 75 MG/ML ~~LOC~~ SOAJ: 75.0000 mg | SUBCUTANEOUS | 2 refills | Status: AC

## 2023-10-21 NOTE — Progress Notes (Signed)
   Subjective:   Patient ID: Erica [name], female    DOB: 1940-06-30, 83 y.o.   MRN: [id number]  HPI The patient is an 83 YO female coming in for discussion about calcium  score. Also feels leg has not recovered strength since taking statins and would like to work with PT to help. Tried several different statins with myopathy.   Review of Systems  Constitutional: Negative.   HENT: Negative.    Eyes: Negative.   Respiratory:  Negative for cough, chest tightness and shortness of breath.   Cardiovascular:  Negative for chest pain, palpitations and leg swelling.  Gastrointestinal:  Negative for abdominal distention, abdominal pain, constipation, diarrhea, nausea and vomiting.  Musculoskeletal: Negative.   Skin: Negative.   Neurological: Negative.   Psychiatric/Behavioral: Negative.      Objective:  Physical Exam Constitutional:      Appearance: She is well-developed.  HENT:     Head: Normocephalic and atraumatic.  Cardiovascular:     Rate and Rhythm: Normal rate and regular rhythm.  Pulmonary:     Effort: Pulmonary effort is normal. No respiratory distress.     Breath sounds: Normal breath sounds. No wheezing or rales.  Abdominal:     General: Bowel sounds are normal. There is no distension.     Palpations: Abdomen is soft.     Tenderness: There is no abdominal tenderness. There is no rebound.  Musculoskeletal:     Cervical back: Normal range of motion.  Skin:    General: Skin is warm and dry.  Neurological:     Mental Status: She is alert and oriented to person, place, and time.     Coordination: Coordination normal.     Vitals:   10/21/23 1314  BP: 118/68  Pulse: 71  Temp: 97.7 F (36.5 C)  TempSrc: Oral  SpO2: 98%  Weight: 145 lb (65.8 kg)  Height: 5\' 4"  (1.626 m)    Assessment & Plan:

## 2023-10-21 NOTE — Patient Instructions (Signed)
 We have sent in the praluent to do the injection for the cholesterol and we will recheck the levels in 1-2 months.  We will get you in with physical therapy for the leg strength.

## 2023-10-21 NOTE — Assessment & Plan Note (Signed)
 Referral to PT and started with taking statins.

## 2023-10-21 NOTE — Assessment & Plan Note (Signed)
 Rx praluent to help with lipids and high calcium  score. Recheck lipid and CMP in 1-2 months.

## 2023-10-23 ENCOUNTER — Other Ambulatory Visit: Payer: Self-pay | Admitting: Internal Medicine

## 2023-10-23 NOTE — Telephone Encounter (Signed)
 Copied from CRM [id number]. Topic: Clinical - Medication Refill >> Oct 23, 2023 12:34 PM Marlan Silva wrote: Most Recent Primary Care Visit:  Provider: Bambi Lever A  Department: [facility]  Visit Type: OFFICE VISIT  Date: 09/23/2023  Medication: Alirocumab  (PRALUENT ) 75 MG/ML  Has the patient contacted their pharmacy? Yes (Agent: If no, request that the patient contact the pharmacy for the refill. If patient does not wish to contact the pharmacy document the reason why and proceed with request.) (Agent: If yes, when and what did the pharmacy advise?)  Is this the correct pharmacy for this prescription? Yes If no, delete pharmacy and type the correct one.  This is the patient's preferred pharmacy:  [id number] [place], [place] - [address] [place] [address] Phone: [phone] Fax: [phone]  [id number] [place], [place] - [address] [place] [address] Phone: [phone] Fax: [phone]   Has the prescription been filled recently? Yes  Is the patient out of the medication? Yes  Has the patient been seen for an appointment in the last year OR does the patient have an upcoming appointment? Yes  Can we respond through MyChart? Yes  Agent: Please be advised that Rx refills may take up to 3 business days. We ask that you follow-up with your pharmacy.

## 2023-10-27 ENCOUNTER — Telehealth: Payer: Self-pay | Admitting: Internal Medicine

## 2023-10-27 NOTE — Telephone Encounter (Signed)
 Call to pharmacy- this medication needs PA - per pharmacist fax has been sent to office.     Copied from CRM [id number]. Topic: Clinical - Prescription Issue >> Oct 24, 2023  4:30 PM Clyde Darling P wrote: Reason for CRM: Pt called to advise that she is needing P[facility] to fill the medication Alirocumab  (PRALUENT ) 75 MG/ML SOAJ >> Oct 27, 2023 11:31 AM Hamdi H wrote: Patient is calling back again, she still hasn't heard back from us  since Thursday last week. She needs her provider to call the pharmacy to give a verbal okay to fill  Alirocumab  (PRALUENT ) 75 MG/ML SOAJ    The best call back number for the patient is: [phone]

## 2023-10-28 ENCOUNTER — Other Ambulatory Visit (HOSPITAL_COMMUNITY): Payer: Self-pay

## 2023-10-29 ENCOUNTER — Telehealth: Payer: Self-pay

## 2023-10-29 ENCOUNTER — Other Ambulatory Visit (HOSPITAL_COMMUNITY): Payer: Self-pay

## 2023-10-29 NOTE — Telephone Encounter (Signed)
 Pharmacy Patient Advocate Encounter   Received notification from Pt Calls Messages that prior authorization for Praluent  75mg /ml is required/requested.   Insurance verification completed.   The patient is insured through [employer] .   Per test claim: PA required; PA submitted to above mentioned insurance via CoverMyMeds Key/confirmation #/EOC [id number] Status is pending

## 2023-10-30 NOTE — Telephone Encounter (Signed)
 Pharmacy Patient Advocate Encounter  Received notification from [employer] that Prior Authorization for  Praluent  75mg /ml  has been DENIED.  Full denial letter will be uploaded to the media tab. See denial reason below.   PA #/Case ID/Reference #: [id number]

## 2023-10-31 DIAGNOSIS — H903 Sensorineural hearing loss, bilateral: Secondary | ICD-10-CM | POA: Diagnosis not present

## 2023-11-06 ENCOUNTER — Telehealth: Payer: Self-pay | Admitting: Physical Therapy

## 2023-11-06 NOTE — Telephone Encounter (Signed)
 Called patient to offer earlier appointment  (for today 5/15) for physical therapy evaluation. Patient would like to stick with Tabitha Ewings and declined earlier apt with different therapist.   9:17 AM, 11/06/23 Tabitha Ewings, DPT Physical Therapy with [facility]

## 2023-11-10 ENCOUNTER — Ambulatory Visit: Admitting: Physical Therapy

## 2023-11-12 ENCOUNTER — Encounter: Payer: Self-pay | Admitting: Physical Therapy

## 2023-11-12 ENCOUNTER — Ambulatory Visit: Admitting: Physical Therapy

## 2023-11-12 DIAGNOSIS — R262 Difficulty in walking, not elsewhere classified: Secondary | ICD-10-CM

## 2023-11-12 DIAGNOSIS — M6281 Muscle weakness (generalized): Secondary | ICD-10-CM | POA: Diagnosis not present

## 2023-11-12 NOTE — Therapy (Signed)
 OUTPATIENT PHYSICAL THERAPY LOWER EXTREMITY EVALUATION   Patient Name: Erica [name] MRN: [id number] DOB:07/30/1940, 83 y.o., female Today's Date: 11/12/2023  END OF SESSION:  PT End of Session - 11/12/23 1252     Visit Number 1    Number of Visits 16    Date for PT Re-Evaluation 02/04/24    Authorization Type humana - auth required- 12 approved from 5/21 to 7/4    Authorization - Visit Number 1    Authorization - Number of Visits 12    Progress Note Due on Visit 10    PT Start Time 1300    PT Stop Time 1340    PT Time Calculation (min) 40 min    Activity Tolerance Patient tolerated treatment well    Behavior During Therapy [facility] for tasks assessed/performed             Past Medical History:  Diagnosis Date   Allergic rhinitis    Anemia    Arthritis    Asthma    Diet-controlled type 2 diabetes mellitus (HCC) 09/2011 dx   Diverticulosis    Dyslipidemia    Macular degeneration    genetic etiology, not age-related per retinal specailist   Recurrent kidney stones    Past Surgical History:  Procedure Laterality Date   CATARACT EXTRACTION Bilateral 01/2015   L on 8/8, R on 8/22   DILATION AND CURETTAGE OF UTERUS     TONSILLECTOMY AND ADENOIDECTOMY     Patient Active Problem List   Diagnosis Date Noted   Statin myopathy 09/23/2023   Low serum albumin 12/19/2022   Weakness of lower extremity 12/19/2022   Hiatal hernia 04/01/2022   Dystrophies primarily involving the retinal pigment epithelium 01/20/2020   Intermediate stage nonexudative age-related macular degeneration of both eyes 01/20/2020   Vitreomacular adhesion of right eye 01/20/2020   Advanced nonexudative age-related macular degeneration of both eyes with subfoveal involvement 01/20/2020   Neck pain 04/03/2018   Low back pain 10/15/2016   SOB (shortness of breath) 08/28/2016   Dysphagia 08/07/2016   Lichen planus 11/16/2015   B12 deficiency 11/12/2015   Routine general medical examination at a health  care facility 11/12/2015   URI (upper respiratory infection) 07/30/2015   Toenail deformity 05/25/2015   Adjustment disorder with mixed anxiety and depressed mood 03/07/2015   Hyperlipidemia associated with type 2 diabetes mellitus (HCC)    Peripheral neuropathy 05/19/2012   Vitamin D  deficiency disease 05/19/2012   Osteopenia 05/19/2012   Osteoarthritis 05/19/2012   Kidney stones 05/19/2012   Diet-controlled type 2 diabetes mellitus (HCC) 05/19/2012    PCP: Adelia Homestead, MD  REFERRING PROVIDER: Adelia Homestead, MD  REFERRING DIAG: R29.898 (ICD-10-CM) - Weakness of lower extremity, unspecified laterality  THERAPY DIAG:  Muscle weakness (generalized) - Plan: PT plan of care cert/re-cert  Difficulty in walking, not elsewhere classified - Plan: PT plan of care cert/re-cert  Rationale for Evaluation and Treatment: Rehabilitation  ONSET DATE: about a year ago.   SUBJECTIVE:   SUBJECTIVE STATEMENT: Patient reports that she started taking statins and noticed weakness in her legs.  She has been off of them for about 8 months and has noticed a slight increase in strength but still has pain and strength deficits particularly in the groin region.  Sometimes the cramps will wake her up at night and she definitely gets them in the morning.  Reports she does have a history of a B12 deficiency and gets injections regularly.  Reports she takes baclofen   at night which helps with her cramps but she still gets them.  She is very active in the garden and dances but does not perform any formal workout routine.  Reports her back is sore after prolonged bending or gardening work.   States her cramps are worse after she does a lot of work in the yard  PERTINENT HISTORY: History of statin use, history of B12 deficiency PAIN:  No pain  PRECAUTIONS: None  RED FLAGS: None   WEIGHT BEARING RESTRICTIONS: No  FALLS:  Has patient fallen in last 6 months? No    OCCUPATION: not  currently working  PLOF: Independent  PATIENT GOALS: to get stronger     OBJECTIVE:  Note: Objective measures were completed at Evaluation unless otherwise noted.  DIAGNOSTIC FINDINGS: no recent imaging on file  PATIENT SURVEYS:  Patient-specific activity functional scoring scheme (Point to one number):  "0" represents "unable to perform." "10" represents "able to perform at prior level. 0 1 2 3 4 5 6 7 8 9  10 (Date and Score) Activity Initial  Activity Eval     Working in the garden  3    Walking >20 minutes  5    Sleeping  5    Additional Additional Total score = sum of the activity scores/number of activities Minimum detectable change (90%CI) for average score = 2 points Minimum detectable change (90%CI) for single activity score = 3 points PSFS developed by: [place] Spitz., & Binkley, J. (1995). Assessing disability and change on individual  patients: a report of a patient specific measure. Physiotherapy [place], [phone], [phone]. Reproduced with the permission of the authors  Score: 13/3=4.33   COGNITION: Overall cognitive status: Within functional limits for tasks assessed     SENSATION: Hypersensitivity to light touch in B LE R>L  EDEMA:  Noted slight edema in both LE    POSTURE: rounded shoulders  PALPATION: Tenderness to palpation along B LE and in right glutes    LE Measurements Lower Extremity Right EVAL Left EVAL   A/PROM MMT A/PROM MMT  Hip Flexion WFL 3+* WFL 3+*  Hip Extension  3+  3+  Hip Abduction      Hip Adduction      Hip Internal rotation [facility]   Hip External rotation [facility]  WFL   Knee Flexion  4  4  Knee Extension  4+  4+  Ankle Dorsiflexion      Ankle Plantarflexion      Ankle Inversion      Ankle Eversion       (Blank rows = not tested) * pain   LOWER EXTREMITY SPECIAL TESTS:  Tandem 30 seconds B increased sway with right posterior, SLS left 30 seconds, SLS right 10 seconds Steps- unable to go up  and down stairs with control without use of UE  TREATMENT DATE:   11/12/2023  Therapeutic Exercise:  Aerobic: Supine: bent knee fall outs 2 minutes, hip add iso ball 2 minutes 5" holds  Prone:  Seated: self mobilization to B LE - tolerated well - used tiger tail, goddess pose 5-second holds  Standing: Neuromuscular Re-education: Manual Therapy: Therapeutic Activity: Self Care: Trigger Point Dry Needling:  Modalities:    PATIENT EDUCATION:  Education details: on current presentation, on HEP, on clinical outcomes score and POC, on importance of hydration as well as use of things like coconut water to reestablish electrolyte balance Person educated: Patient Education method: Explanation, Demonstration, and Handouts Education comprehension: verbalized understanding   HOME EXERCISE PROGRAM: [id number]  ASSESSMENT:  CLINICAL IMPRESSION: Patient presents to physical therapy with complaints of bilateral lower extremity weakness, pain and balance deficits.  Symptoms started after use of statins and has improved over the last 8 months but is still limiting her function at home and in the community.  Patient presents with weakness, spasms and pain that is greatly limiting current presentation.  Educated patient on gentle stretching and muscle activation exercises which were tolerated moderately well.  Patient would greatly benefit from skilled PT to improve overall functional strength and mobility to maintain independence at home.  OBJECTIVE IMPAIRMENTS: decreased activity tolerance, decreased balance, decreased ROM, decreased strength, improper body mechanics, postural dysfunction, and pain.   ACTIVITY LIMITATIONS: standing, sleeping, stairs, and locomotion level  PARTICIPATION LIMITATIONS: community activity and yard work  PERSONAL FACTORS: Age, Fitness,  and Time since onset of injury/illness/exacerbation are also affecting patient's functional outcome.   REHAB POTENTIAL: Good  CLINICAL DECISION MAKING: Stable/uncomplicated  EVALUATION COMPLEXITY: Low   GOALS: Goals reviewed with patient? yes  SHORT TERM GOALS: Target date: 12/24/2023   Patient will be independent in self management strategies to improve quality of life and functional outcomes. Baseline: New Program Goal status: INITIAL  2.  Patient will report at least 50% improvement in overall symptoms and/or function to demonstrate improved functional mobility Baseline: 0% better Goal status: INITIAL  3.  Patient will report no cramps in the morning to improve sleep tolerance Baseline: Currently getting cramps Goal status: INITIAL     LONG TERM GOALS: Target date: 02/04/2024   Patient will report at least 75% improvement in overall symptoms and/or function to demonstrate improved functional mobility Baseline: 0% better Goal status: INITIAL  2.  Patient will score at least 2 points higher on PSFS average to demonstrate change in overall function. Baseline: see above Goal status: INITIAL  3.  Patient will be able to stand on either leg and single-leg stance for at least 30 seconds to demonstrate improved static balance Baseline:  Goal status: INITIAL    PLAN:  PT FREQUENCY: 1-2x/week for a total of 16 visits over 12 week certification period   PT DURATION: 12 weeks  PLANNED INTERVENTIONS: 97110-Therapeutic exercises, 97530- Therapeutic activity, 97112- Neuromuscular re-education, 97535- Self Care, 40981- Manual therapy, [phone]- Gait training, [phone]- Orthotic Fit/training, [phone]- Canalith repositioning, [id number]- Aquatic Therapy, 97014- Electrical stimulation (unattended), [phone]- Ionotophoresis 4mg /ml Dexamethasone, Patient/Family education, Balance training, Stair training, Taping, Dry Needling, Joint mobilization, Joint manipulation, Spinal manipulation, Spinal  mobilization, Cryotherapy, and Moist heat   PLAN FOR NEXT SESSION: STM, hip ROM and strengthening, core strengthening    2:23 PM, 11/12/23 Tabitha Ewings, DPT Physical Therapy with [place]

## 2023-11-18 ENCOUNTER — Telehealth: Payer: Self-pay | Admitting: Physical Therapy

## 2023-11-18 ENCOUNTER — Encounter: Admitting: Physical Therapy

## 2023-11-18 NOTE — Telephone Encounter (Signed)
 Called pt about missed apt and pt forgot about her apt and apologized. Confirmed next apt with pt.  9:47 AM, 11/18/23 Tabitha Ewings, DPT Physical Therapy with [place]

## 2023-11-20 ENCOUNTER — Ambulatory Visit: Admitting: Physical Therapy

## 2023-11-20 ENCOUNTER — Encounter: Payer: Self-pay | Admitting: Physical Therapy

## 2023-11-20 DIAGNOSIS — R262 Difficulty in walking, not elsewhere classified: Secondary | ICD-10-CM

## 2023-11-20 DIAGNOSIS — M6281 Muscle weakness (generalized): Secondary | ICD-10-CM

## 2023-11-20 NOTE — Therapy (Signed)
 OUTPATIENT PHYSICAL THERAPY LOWER EXTREMITY TREATMENT   Patient Name: Erica [name] MRN: [id number] DOB:February 25, 1941, 83 y.o., female Today's Date: 11/20/2023  END OF SESSION:  PT End of Session - 11/20/23 0934     Visit Number 2    Number of Visits 16    Date for PT Re-Evaluation 02/04/24    Authorization Type humana - auth required- 12 approved from 5/21 to 7/4    Authorization - Visit Number 2    Authorization - Number of Visits 12    Progress Note Due on Visit 10    PT Start Time 0935    PT Stop Time 1013    PT Time Calculation (min) 38 min    Activity Tolerance Patient tolerated treatment well    Behavior During Therapy [facility] for tasks assessed/performed             Past Medical History:  Diagnosis Date   Allergic rhinitis    Anemia    Arthritis    Asthma    Diet-controlled type 2 diabetes mellitus (HCC) 09/2011 dx   Diverticulosis    Dyslipidemia    Macular degeneration    genetic etiology, not age-related per retinal specailist   Recurrent kidney stones    Past Surgical History:  Procedure Laterality Date   CATARACT EXTRACTION Bilateral 01/2015   L on 8/8, R on 8/22   DILATION AND CURETTAGE OF UTERUS     TONSILLECTOMY AND ADENOIDECTOMY     Patient Active Problem List   Diagnosis Date Noted   Statin myopathy 09/23/2023   Low serum albumin 12/19/2022   Weakness of lower extremity 12/19/2022   Hiatal hernia 04/01/2022   Dystrophies primarily involving the retinal pigment epithelium 01/20/2020   Intermediate stage nonexudative age-related macular degeneration of both eyes 01/20/2020   Vitreomacular adhesion of right eye 01/20/2020   Advanced nonexudative age-related macular degeneration of both eyes with subfoveal involvement 01/20/2020   Neck pain 04/03/2018   Low back pain 10/15/2016   SOB (shortness of breath) 08/28/2016   Dysphagia 08/07/2016   Lichen planus 11/16/2015   B12 deficiency 11/12/2015   Routine general medical examination at a health  care facility 11/12/2015   URI (upper respiratory infection) 07/30/2015   Toenail deformity 05/25/2015   Adjustment disorder with mixed anxiety and depressed mood 03/07/2015   Hyperlipidemia associated with type 2 diabetes mellitus (HCC)    Peripheral neuropathy 05/19/2012   Vitamin D  deficiency disease 05/19/2012   Osteopenia 05/19/2012   Osteoarthritis 05/19/2012   Kidney stones 05/19/2012   Diet-controlled type 2 diabetes mellitus (HCC) 05/19/2012    PCP: Adelia Homestead, MD  REFERRING PROVIDER: Adelia Homestead, MD  REFERRING DIAG: R29.898 (ICD-10-CM) - Weakness of lower extremity, unspecified laterality  THERAPY DIAG:  Muscle weakness (generalized)  Difficulty in walking, not elsewhere classified  Rationale for Evaluation and Treatment: Rehabilitation  ONSET DATE: about a year ago.   SUBJECTIVE:   SUBJECTIVE STATEMENT: 11/20/2023 First morning she has not had any been exerciseing and drinking coconut water.  Eval: Patient reports that she started taking statins and noticed weakness in her legs.  She has been off of them for about 8 months and has noticed a slight increase in strength but still has pain and strength deficits particularly in the groin region.  Sometimes the cramps will wake her up at night and she definitely gets them in the morning.  Reports she does have a history of a B12 deficiency and gets injections regularly.  Reports she  takes baclofen  at night which helps with her cramps but she still gets them.  She is very active in the garden and dances but does not perform any formal workout routine.  Reports her back is sore after prolonged bending or gardening work.   States her cramps are worse after she does a lot of work in the yard  PERTINENT HISTORY: History of statin use, history of B12 deficiency PAIN:  No pain  PRECAUTIONS: None  RED FLAGS: None   WEIGHT BEARING RESTRICTIONS: No  FALLS:  Has patient fallen in last 6 months?  No    OCCUPATION: not currently working  PLOF: Independent  PATIENT GOALS: to get stronger     OBJECTIVE:  Note: Objective measures were completed at Evaluation unless otherwise noted.  DIAGNOSTIC FINDINGS: no recent imaging on file  PATIENT SURVEYS:  Patient-specific activity functional scoring scheme (Point to one number):  "0" represents "unable to perform." "10" represents "able to perform at prior level. 0 1 2 3 4 5 6 7 8 9  10 (Date and Score) Activity Initial  Activity Eval     Working in the garden  3    Walking >20 minutes  5    Sleeping  5    Additional Additional Total score = sum of the activity scores/number of activities Minimum detectable change (90%CI) for average score = 2 points Minimum detectable change (90%CI) for single activity score = 3 points PSFS developed by: Melbourne Spitz., & Binkley, J. (1995). Assessing disability and change on individual  patients: a report of a patient specific measure. Physiotherapy [place], 47, [phone]. Reproduced with the permission of the authors  Score: 13/3=4.33   COGNITION: Overall cognitive status: Within functional limits for tasks assessed     SENSATION: Hypersensitivity to light touch in B LE R>L  EDEMA:  Noted slight edema in both LE    POSTURE: rounded shoulders  PALPATION: Tenderness to palpation along B LE and in right glutes    LE Measurements Lower Extremity Right EVAL Left EVAL   A/PROM MMT A/PROM MMT  Hip Flexion WFL 3+* WFL 3+*  Hip Extension  3+  3+  Hip Abduction      Hip Adduction      Hip Internal rotation [facility]  [facility]   Hip External rotation [facility]  WFL   Knee Flexion  4  4  Knee Extension  4+  4+  Ankle Dorsiflexion      Ankle Plantarflexion      Ankle Inversion      Ankle Eversion       (Blank rows = not tested) * pain   LOWER EXTREMITY SPECIAL TESTS:  Tandem 30 seconds B increased sway with right posterior, SLS left 30 seconds, SLS right 10  seconds Steps- unable to go up and down stairs with control without use of UE  TREATMENT DATE:   11/20/2023  Therapeutic Exercise:  Aerobic: Supine: bridges 3x5, piriformis stretch x3 30" holds B, hip IR 2 minutes alternating   Prone:  Seated: STS 5# weight 3x10, LAQS with 10" holds x2 B   self mobilization to B LE - tolerated well - used tiger tail, goddess pose 5-second holds  Standing: Neuromuscular Re-education: Manual Therapy: Therapeutic Activity: Self Care: Trigger Point Dry Needling:  Modalities:    PATIENT EDUCATION:  Education details: on HEP Person educated: Patient Education method: Programmer, multimedia, Facilities manager, and Handouts Education comprehension: verbalized understanding   HOME EXERCISE PROGRAM: [id number]  ASSESSMENT:  CLINICAL IMPRESSION: 11/20/2023 Added new exercises, fatigue in legs noted with STS and bridges. Transitioned to stretch and educated patient on anticipated delayed onset muscle soreness with new exercises. Discussed how to reduce this with motion, massage and heat. Fatigue noted end of session but no pain. Will continue with current POC as tolerated.    Eval: Patient presents to physical therapy with complaints of bilateral lower extremity weakness, pain and balance deficits.  Symptoms started after use of statins and has improved over the last 8 months but is still limiting her function at home and in the community.  Patient presents with weakness, spasms and pain that is greatly limiting current presentation.  Educated patient on gentle stretching and muscle activation exercises which were tolerated moderately well.  Patient would greatly benefit from skilled PT to improve overall functional strength and mobility to maintain independence at home.  OBJECTIVE IMPAIRMENTS: decreased activity tolerance, decreased  balance, decreased ROM, decreased strength, improper body mechanics, postural dysfunction, and pain.   ACTIVITY LIMITATIONS: standing, sleeping, stairs, and locomotion level  PARTICIPATION LIMITATIONS: community activity and yard work  PERSONAL FACTORS: Age, Fitness, and Time since onset of injury/illness/exacerbation are also affecting patient's functional outcome.   REHAB POTENTIAL: Good  CLINICAL DECISION MAKING: Stable/uncomplicated  EVALUATION COMPLEXITY: Low   GOALS: Goals reviewed with patient? yes  SHORT TERM GOALS: Target date: 12/24/2023   Patient will be independent in self management strategies to improve quality of life and functional outcomes. Baseline: New Program Goal status: INITIAL  2.  Patient will report at least 50% improvement in overall symptoms and/or function to demonstrate improved functional mobility Baseline: 0% better Goal status: INITIAL  3.  Patient will report no cramps in the morning to improve sleep tolerance Baseline: Currently getting cramps Goal status: INITIAL     LONG TERM GOALS: Target date: 02/04/2024   Patient will report at least 75% improvement in overall symptoms and/or function to demonstrate improved functional mobility Baseline: 0% better Goal status: INITIAL  2.  Patient will score at least 2 points higher on PSFS average to demonstrate change in overall function. Baseline: see above Goal status: INITIAL  3.  Patient will be able to stand on either leg and single-leg stance for at least 30 seconds to demonstrate improved static balance Baseline:  Goal status: INITIAL    PLAN:  PT FREQUENCY: 1-2x/week for a total of 16 visits over 12 week certification period   PT DURATION: 12 weeks  PLANNED INTERVENTIONS: 97110-Therapeutic exercises, 97530- Therapeutic activity, 97112- Neuromuscular re-education, 97535- Self Care, [id number]- Manual therapy, [id number]- Gait training, [phone]- Orthotic Fit/training, [phone]- Canalith  repositioning, [id number]- Aquatic Therapy, 97014- Electrical stimulation (unattended), [phone]- Ionotophoresis 4mg /ml Dexamethasone, Patient/Family education, Balance training, Stair training, Taping, Dry Needling, Joint mobilization, Joint manipulation, Spinal manipulation, Spinal mobilization, Cryotherapy, and Moist heat   PLAN FOR NEXT SESSION: STM, hip ROM and strengthening, core strengthening  10:17 AM, 11/20/23 Tabitha Ewings, DPT Physical Therapy with [facility]

## 2023-11-24 ENCOUNTER — Other Ambulatory Visit: Payer: Self-pay | Admitting: Internal Medicine

## 2023-11-24 MED ORDER — ROSUVASTATIN CALCIUM 5 MG PO TABS: 5.0000 mg | ORAL_TABLET | ORAL | 3 refills | Status: DC

## 2023-12-01 ENCOUNTER — Ambulatory Visit: Admitting: Physical Therapy

## 2023-12-01 ENCOUNTER — Encounter: Payer: Self-pay | Admitting: Physical Therapy

## 2023-12-01 DIAGNOSIS — R262 Difficulty in walking, not elsewhere classified: Secondary | ICD-10-CM | POA: Diagnosis not present

## 2023-12-01 DIAGNOSIS — M6281 Muscle weakness (generalized): Secondary | ICD-10-CM | POA: Diagnosis not present

## 2023-12-01 NOTE — Therapy (Signed)
 OUTPATIENT PHYSICAL THERAPY LOWER EXTREMITY TREATMENT   Patient Name: Erica [name] MRN: [id number] DOB:August 26, 1940, 83 y.o., female Today's Date: 12/01/2023  END OF SESSION:  PT End of Session - 12/01/23 1347     Visit Number 3    Number of Visits 16    Date for PT Re-Evaluation 02/04/24    Authorization Type humana - auth required- 12 approved from 5/21 to 7/4    Authorization - Visit Number 3    Authorization - Number of Visits 12    Progress Note Due on Visit 10    PT Start Time 1347    PT Stop Time 1426    PT Time Calculation (min) 39 min    Activity Tolerance Patient tolerated treatment well    Behavior During Therapy [facility] for tasks assessed/performed             Past Medical History:  Diagnosis Date   Allergic rhinitis    Anemia    Arthritis    Asthma    Diet-controlled type 2 diabetes mellitus (HCC) 09/2011 dx   Diverticulosis    Dyslipidemia    Macular degeneration    genetic etiology, not age-related per retinal specailist   Recurrent kidney stones    Past Surgical History:  Procedure Laterality Date   CATARACT EXTRACTION Bilateral 01/2015   L on 8/8, R on 8/22   DILATION AND CURETTAGE OF UTERUS     TONSILLECTOMY AND ADENOIDECTOMY     Patient Active Problem List   Diagnosis Date Noted   Statin myopathy 09/23/2023   Low serum albumin 12/19/2022   Weakness of lower extremity 12/19/2022   Hiatal hernia 04/01/2022   Dystrophies primarily involving the retinal pigment epithelium 01/20/2020   Intermediate stage nonexudative age-related macular degeneration of both eyes 01/20/2020   Vitreomacular adhesion of right eye 01/20/2020   Advanced nonexudative age-related macular degeneration of both eyes with subfoveal involvement 01/20/2020   Neck pain 04/03/2018   Low back pain 10/15/2016   SOB (shortness of breath) 08/28/2016   Dysphagia 08/07/2016   Lichen planus 11/16/2015   B12 deficiency 11/12/2015   Routine general medical examination at a health care  facility 11/12/2015   URI (upper respiratory infection) 07/30/2015   Toenail deformity 05/25/2015   Adjustment disorder with mixed anxiety and depressed mood 03/07/2015   Hyperlipidemia associated with type 2 diabetes mellitus (HCC)    Peripheral neuropathy 05/19/2012   Vitamin D  deficiency disease 05/19/2012   Osteopenia 05/19/2012   Osteoarthritis 05/19/2012   Kidney stones 05/19/2012   Diet-controlled type 2 diabetes mellitus (HCC) 05/19/2012    PCP: Adelia Homestead, MD  REFERRING PROVIDER: Adelia Homestead, MD  REFERRING DIAG: R29.898 (ICD-10-CM) - Weakness of lower extremity, unspecified laterality  THERAPY DIAG:  Muscle weakness (generalized)  Difficulty in walking, not elsewhere classified  Rationale for Evaluation and Treatment: Rehabilitation  ONSET DATE: about a year ago.   SUBJECTIVE:   SUBJECTIVE STATEMENT: 12/01/2023 Reports so her butt muscles have been a little sore. States she has been using a pillow and 5# weight.   Eval: Patient reports that she started taking statins and noticed weakness in her legs.  She has been off of them for about 8 months and has noticed a slight increase in strength but still has pain and strength deficits particularly in the groin region.  Sometimes the cramps will wake her up at night and she definitely gets them in the morning.  Reports she does have a history of a B12  deficiency and gets injections regularly.  Reports she takes baclofen  at night which helps with her cramps but she still gets them.  She is very active in the garden and dances but does not perform any formal workout routine.  Reports her back is sore after prolonged bending or gardening work.   States her cramps are worse after she does a lot of work in the yard  PERTINENT HISTORY: History of statin use, history of B12 deficiency PAIN:  No pain  PRECAUTIONS: None  RED FLAGS: None   WEIGHT BEARING RESTRICTIONS: No  FALLS:  Has patient fallen in  last 6 months? No    OCCUPATION: not currently working  PLOF: Independent  PATIENT GOALS: to get stronger     OBJECTIVE:  Note: Objective measures were completed at Evaluation unless otherwise noted.  DIAGNOSTIC FINDINGS: no recent imaging on file  PATIENT SURVEYS:  Patient-specific activity functional scoring scheme (Point to one number):  "0" represents "unable to perform." "10" represents "able to perform at prior level. 0 1 2 3 4 5 6 7 8 9  10 (Date and Score) Activity Initial  Activity Eval     Working in the garden  3    Walking >20 minutes  5    Sleeping  5    Additional Additional Total score = sum of the activity scores/number of activities Minimum detectable change (90%CI) for average score = 2 points Minimum detectable change (90%CI) for single activity score = 3 points PSFS developed by: [place] Spitz., & Binkley, J. (1995). Assessing disability and change on individual  patients: a report of a patient specific measure. Physiotherapy [place], 47, [phone]. Reproduced with the permission of the authors  Score: 13/3=4.33   COGNITION: Overall cognitive status: Within functional limits for tasks assessed     SENSATION: Hypersensitivity to light touch in B LE R>L  EDEMA:  Noted slight edema in both LE    POSTURE: rounded shoulders  PALPATION: Tenderness to palpation along B LE and in right glutes    LE Measurements Lower Extremity Right EVAL Left EVAL   A/PROM MMT A/PROM MMT  Hip Flexion WFL 3+* WFL 3+*  Hip Extension  3+  3+  Hip Abduction      Hip Adduction      Hip Internal rotation [facility]  [facility]   Hip External rotation [facility]  WFL   Knee Flexion  4  4  Knee Extension  4+  4+  Ankle Dorsiflexion      Ankle Plantarflexion      Ankle Inversion      Ankle Eversion       (Blank rows = not tested) * pain   LOWER EXTREMITY SPECIAL TESTS:  Tandem 30 seconds B increased sway with right posterior, SLS left 30 seconds, SLS  right 10 seconds Steps- unable to go up and down stairs with control without use of UE  TREATMENT DATE:   12/01/2023  Therapeutic Exercise:  Aerobic: Supine: bridges 3x5, piriformis stretch x3 30" holds B, hip IR 2 minutes alternating   Prone:  Seated: LAQs x10 5" holds B 2.5# weights, chest press 3x5 2.5#, goddess pose x5 10" holds  Standing:  hamstring curls 2.5 # 2x10 B, hip abd 2x10 2.5# B    Standing: Neuromuscular Re-education: Manual Therapy: Therapeutic Activity: Self Care: Trigger Point Dry Needling:  Modalities:    PATIENT EDUCATION:  Education details: on HEP Person educated: Patient Education method: Programmer, multimedia, Facilities manager, and Handouts Education comprehension: verbalized understanding   HOME EXERCISE PROGRAM: [id number] Goddess pose  ASSESSMENT:  CLINICAL IMPRESSION: 12/01/2023 Added new exercises,and fatigue noted with new exercises. Took long rest breaks as needed. Printed off new exercises for HEP adherence, no increase in pain noted during or after session. Will continue with current POC as tolerated.    Eval: Patient presents to physical therapy with complaints of bilateral lower extremity weakness, pain and balance deficits.  Symptoms started after use of statins and has improved over the last 8 months but is still limiting her function at home and in the community.  Patient presents with weakness, spasms and pain that is greatly limiting current presentation.  Educated patient on gentle stretching and muscle activation exercises which were tolerated moderately well.  Patient would greatly benefit from skilled PT to improve overall functional strength and mobility to maintain independence at home.  OBJECTIVE IMPAIRMENTS: decreased activity tolerance, decreased balance, decreased ROM, decreased strength, improper body  mechanics, postural dysfunction, and pain.   ACTIVITY LIMITATIONS: standing, sleeping, stairs, and locomotion level  PARTICIPATION LIMITATIONS: community activity and yard work  PERSONAL FACTORS: Age, Fitness, and Time since onset of injury/illness/exacerbation are also affecting patient's functional outcome.   REHAB POTENTIAL: Good  CLINICAL DECISION MAKING: Stable/uncomplicated  EVALUATION COMPLEXITY: Low   GOALS: Goals reviewed with patient? yes  SHORT TERM GOALS: Target date: 12/24/2023   Patient will be independent in self management strategies to improve quality of life and functional outcomes. Baseline: New Program Goal status: INITIAL  2.  Patient will report at least 50% improvement in overall symptoms and/or function to demonstrate improved functional mobility Baseline: 0% better Goal status: INITIAL  3.  Patient will report no cramps in the morning to improve sleep tolerance Baseline: Currently getting cramps Goal status: INITIAL     LONG TERM GOALS: Target date: 02/04/2024   Patient will report at least 75% improvement in overall symptoms and/or function to demonstrate improved functional mobility Baseline: 0% better Goal status: INITIAL  2.  Patient will score at least 2 points higher on PSFS average to demonstrate change in overall function. Baseline: see above Goal status: INITIAL  3.  Patient will be able to stand on either leg and single-leg stance for at least 30 seconds to demonstrate improved static balance Baseline:  Goal status: INITIAL    PLAN:  PT FREQUENCY: 1-2x/week for a total of 16 visits over 12 week certification period   PT DURATION: 12 weeks  PLANNED INTERVENTIONS: 97110-Therapeutic exercises, 97530- Therapeutic activity, 97112- Neuromuscular re-education, 97535- Self Care, 60454- Manual therapy, [id number]- Gait training, [phone]- Orthotic Fit/training, [phone]- Canalith repositioning, [id number]- Aquatic Therapy, 97014- Electrical stimulation  (unattended), [phone]- Ionotophoresis 4mg /ml Dexamethasone, Patient/Family education, Balance training, Stair training, Taping, Dry Needling, Joint mobilization, Joint manipulation, Spinal manipulation, Spinal mobilization, Cryotherapy, and Moist heat   PLAN FOR NEXT SESSION: STM, hip ROM and strengthening, core strengthening    2:31 PM, 12/01/23 Tabitha Ewings, DPT  Physical Therapy with [place]

## 2023-12-03 ENCOUNTER — Ambulatory Visit: Admitting: Physical Therapy

## 2023-12-03 ENCOUNTER — Encounter: Payer: Self-pay | Admitting: Physical Therapy

## 2023-12-03 DIAGNOSIS — R262 Difficulty in walking, not elsewhere classified: Secondary | ICD-10-CM

## 2023-12-03 DIAGNOSIS — M6281 Muscle weakness (generalized): Secondary | ICD-10-CM

## 2023-12-03 NOTE — Therapy (Signed)
 OUTPATIENT PHYSICAL THERAPY LOWER EXTREMITY TREATMENT   Patient Name: Erica [name] MRN: [id number] DOB:04-30-1941, 83 y.o., female Today's Date: 12/03/2023  END OF SESSION:  PT End of Session - 12/03/23 1348     Visit Number 4    Number of Visits 16    Date for PT Re-Evaluation 02/04/24    Authorization Type humana - auth required- 12 approved from 5/21 to 7/4    Authorization - Visit Number 4    Authorization - Number of Visits 12    Progress Note Due on Visit 10    PT Start Time 1349    PT Stop Time 1427    PT Time Calculation (min) 38 min    Activity Tolerance Patient tolerated treatment well    Behavior During Therapy [facility] for tasks assessed/performed             Past Medical History:  Diagnosis Date   Allergic rhinitis    Anemia    Arthritis    Asthma    Diet-controlled type 2 diabetes mellitus (HCC) 09/2011 dx   Diverticulosis    Dyslipidemia    Macular degeneration    genetic etiology, not age-related per retinal specailist   Recurrent kidney stones    Past Surgical History:  Procedure Laterality Date   CATARACT EXTRACTION Bilateral 01/2015   L on 8/8, R on 8/22   DILATION AND CURETTAGE OF UTERUS     TONSILLECTOMY AND ADENOIDECTOMY     Patient Active Problem List   Diagnosis Date Noted   Statin myopathy 09/23/2023   Low serum albumin 12/19/2022   Weakness of lower extremity 12/19/2022   Hiatal hernia 04/01/2022   Dystrophies primarily involving the retinal pigment epithelium 01/20/2020   Intermediate stage nonexudative age-related macular degeneration of both eyes 01/20/2020   Vitreomacular adhesion of right eye 01/20/2020   Advanced nonexudative age-related macular degeneration of both eyes with subfoveal involvement 01/20/2020   Neck pain 04/03/2018   Low back pain 10/15/2016   SOB (shortness of breath) 08/28/2016   Dysphagia 08/07/2016   Lichen planus 11/16/2015   B12 deficiency 11/12/2015   Routine general medical examination at a health  care facility 11/12/2015   URI (upper respiratory infection) 07/30/2015   Toenail deformity 05/25/2015   Adjustment disorder with mixed anxiety and depressed mood 03/07/2015   Hyperlipidemia associated with type 2 diabetes mellitus (HCC)    Peripheral neuropathy 05/19/2012   Vitamin D  deficiency disease 05/19/2012   Osteopenia 05/19/2012   Osteoarthritis 05/19/2012   Kidney stones 05/19/2012   Diet-controlled type 2 diabetes mellitus (HCC) 05/19/2012    PCP: Adelia Homestead, MD  REFERRING PROVIDER: Adelia Homestead, MD  REFERRING DIAG: R29.898 (ICD-10-CM) - Weakness of lower extremity, unspecified laterality  THERAPY DIAG:  Muscle weakness (generalized)  Difficulty in walking, not elsewhere classified  Rationale for Evaluation and Treatment: Rehabilitation  ONSET DATE: about a year ago.   SUBJECTIVE:   SUBJECTIVE STATEMENT: 12/03/2023 Reports some soreness in her legs.   Eval: Patient reports that she started taking statins and noticed weakness in her legs.  She has been off of them for about 8 months and has noticed a slight increase in strength but still has pain and strength deficits particularly in the groin region.  Sometimes the cramps will wake her up at night and she definitely gets them in the morning.  Reports she does have a history of a B12 deficiency and gets injections regularly.  Reports she takes baclofen  at night which helps  with her cramps but she still gets them.  She is very active in the garden and dances but does not perform any formal workout routine.  Reports her back is sore after prolonged bending or gardening work.   States her cramps are worse after she does a lot of work in the yard  PERTINENT HISTORY: History of statin use, history of B12 deficiency PAIN:  No pain  PRECAUTIONS: None  RED FLAGS: None   WEIGHT BEARING RESTRICTIONS: No  FALLS:  Has patient fallen in last 6 months? No    OCCUPATION: not currently  working  PLOF: Independent  PATIENT GOALS: to get stronger     OBJECTIVE:  Note: Objective measures were completed at Evaluation unless otherwise noted.  DIAGNOSTIC FINDINGS: no recent imaging on file  PATIENT SURVEYS:  Patient-specific activity functional scoring scheme (Point to one number):  0 represents "unable to perform." 10 represents "able to perform at prior level. 0 1 2 3 4 5 6 7 8 9  10 (Date and Score) Activity Initial  Activity Eval     Working in the garden  3    Walking >20 minutes  5    Sleeping  5    Additional Additional Total score = sum of the activity scores/number of activities Minimum detectable change (90%CI) for average score = 2 points Minimum detectable change (90%CI) for single activity score = 3 points PSFS developed by: [place] Spitz., & Binkley, J. (1995). Assessing disability and change on individual  patients: a report of a patient specific measure. Physiotherapy [place], 47, [phone]. Reproduced with the permission of the authors  Score: 13/3=4.33   COGNITION: Overall cognitive status: Within functional limits for tasks assessed     SENSATION: Hypersensitivity to light touch in B LE R>L  EDEMA:  Noted slight edema in both LE    POSTURE: rounded shoulders  PALPATION: Tenderness to palpation along B LE and in right glutes    LE Measurements Lower Extremity Right EVAL Left EVAL   A/PROM MMT A/PROM MMT  Hip Flexion WFL 3+* WFL 3+*  Hip Extension  3+  3+  Hip Abduction      Hip Adduction      Hip Internal rotation [facility]  [facility]   Hip External rotation [facility]  WFL   Knee Flexion  4  4  Knee Extension  4+  4+  Ankle Dorsiflexion      Ankle Plantarflexion      Ankle Inversion      Ankle Eversion       (Blank rows = not tested) * pain   LOWER EXTREMITY SPECIAL TESTS:  Tandem 30 seconds B increased sway with right posterior, SLS left 30 seconds, SLS right 10 seconds Steps- unable to go up and down  stairs with control without use of UE  TREATMENT DATE:   12/03/2023  Therapeutic Exercise:  Aerobic: Supine: SKC 2x5 10 holds B, piriformis stretch x3 30 hold   bridges 3x5, piriformis stretch x3 30 holds B, hip IR 2 minutes alternating   Prone:  Seated: piriformis stretch x3 30 hlds B elevated seat Standing:      Standing: one arm up overhead march hold 2x5 10 holds B hand support (felt better after massage gun)  Neuromuscular Re-education: Manual Therapy: IASTM and STM to B hip muscles with percussion gun - tolerated well  Therapeutic Activity: Self Care: on different types of compression and when and how to wear for swelling management Trigger Point Dry Needling:  Modalities:    PATIENT EDUCATION:  Education details: on HEP Person educated: Patient Education method: Programmer, multimedia, Facilities manager, and Handouts Education comprehension: verbalized understanding   HOME EXERCISE PROGRAM: [id number] Goddess pose  ASSESSMENT:  CLINICAL IMPRESSION: 12/03/2023 Focused on mobility and soft tissue secondary to increased soreness and difficulties falling asleep after last session. Answered all questions and added new mobility exercises to HEP. Reduced Pain and cramping noted in hips after soft tissue work Will continue with current POC as tolerated.    Eval: Patient presents to physical therapy with complaints of bilateral lower extremity weakness, pain and balance deficits.  Symptoms started after use of statins and has improved over the last 8 months but is still limiting her function at home and in the community.  Patient presents with weakness, spasms and pain that is greatly limiting current presentation.  Educated patient on gentle stretching and muscle activation exercises which were tolerated moderately well.  Patient would greatly benefit from  skilled PT to improve overall functional strength and mobility to maintain independence at home.  OBJECTIVE IMPAIRMENTS: decreased activity tolerance, decreased balance, decreased ROM, decreased strength, improper body mechanics, postural dysfunction, and pain.   ACTIVITY LIMITATIONS: standing, sleeping, stairs, and locomotion level  PARTICIPATION LIMITATIONS: community activity and yard work  PERSONAL FACTORS: Age, Fitness, and Time since onset of injury/illness/exacerbation are also affecting patient's functional outcome.   REHAB POTENTIAL: Good  CLINICAL DECISION MAKING: Stable/uncomplicated  EVALUATION COMPLEXITY: Low   GOALS: Goals reviewed with patient? yes  SHORT TERM GOALS: Target date: 12/24/2023   Patient will be independent in self management strategies to improve quality of life and functional outcomes. Baseline: New Program Goal status: INITIAL  2.  Patient will report at least 50% improvement in overall symptoms and/or function to demonstrate improved functional mobility Baseline: 0% better Goal status: INITIAL  3.  Patient will report no cramps in the morning to improve sleep tolerance Baseline: Currently getting cramps Goal status: INITIAL     LONG TERM GOALS: Target date: 02/04/2024   Patient will report at least 75% improvement in overall symptoms and/or function to demonstrate improved functional mobility Baseline: 0% better Goal status: INITIAL  2.  Patient will score at least 2 points higher on PSFS average to demonstrate change in overall function. Baseline: see above Goal status: INITIAL  3.  Patient will be able to stand on either leg and single-leg stance for at least 30 seconds to demonstrate improved static balance Baseline:  Goal status: INITIAL    PLAN:  PT FREQUENCY: 1-2x/week for a total of 16 visits over 12 week certification period   PT DURATION: 12 weeks  PLANNED INTERVENTIONS: 97110-Therapeutic exercises, 97530- Therapeutic  activity, [id number]- Neuromuscular re-education, 97535- Self Care, 09811- Manual therapy, [id number]- Gait training, [phone]- Orthotic Fit/training, [phone]- Canalith repositioning, [id number]- Aquatic Therapy, 97014- Electrical stimulation (unattended),  16109- Ionotophoresis 4mg /ml Dexamethasone, Patient/Family education, Balance training, Stair training, Taping, Dry Needling, Joint mobilization, Joint manipulation, Spinal manipulation, Spinal mobilization, Cryotherapy, and Moist heat   PLAN FOR NEXT SESSION: STM, hip ROM and strengthening, core strengthening    2:34 PM, 12/03/23 Tabitha Ewings, DPT Physical Therapy with [place]

## 2023-12-08 ENCOUNTER — Encounter: Payer: Self-pay | Admitting: Physical Therapy

## 2023-12-08 ENCOUNTER — Ambulatory Visit: Admitting: Physical Therapy

## 2023-12-08 DIAGNOSIS — M6281 Muscle weakness (generalized): Secondary | ICD-10-CM | POA: Diagnosis not present

## 2023-12-08 DIAGNOSIS — R262 Difficulty in walking, not elsewhere classified: Secondary | ICD-10-CM

## 2023-12-08 NOTE — Therapy (Signed)
 OUTPATIENT PHYSICAL THERAPY LOWER EXTREMITY TREATMENT   Patient Name: Erica [name] MRN: [id number] DOB:[birth year], 83 y.o., female Today's Date: 12/08/2023  END OF SESSION:  PT End of Session - 12/08/23 1350     Visit Number 5    Number of Visits 16    Date for PT Re-Evaluation 02/04/24    Authorization Type humana - auth required- 12 approved from 5/21 to 7/4    Authorization - Visit Number 5    Authorization - Number of Visits 12    Progress Note Due on Visit 10    PT Start Time 1350    PT Stop Time 1428    PT Time Calculation (min) 38 min    Activity Tolerance Patient tolerated treatment well    Behavior During Therapy WFL for tasks assessed/performed          Past Medical History:  Diagnosis Date   Allergic rhinitis    Anemia    Arthritis    Asthma    Diet-controlled type 2 diabetes mellitus (HCC) 09/2011 dx   Diverticulosis    Dyslipidemia    Macular degeneration    genetic etiology, not age-related per retinal specailist   Recurrent kidney stones    Past Surgical History:  Procedure Laterality Date   CATARACT EXTRACTION Bilateral 01/2015   L on 8/8, R on 8/22   DILATION AND CURETTAGE OF UTERUS     TONSILLECTOMY AND ADENOIDECTOMY     Patient Active Problem List   Diagnosis Date Noted   Statin myopathy 09/23/2023   Low serum albumin 12/19/2022   Weakness of lower extremity 12/19/2022   Hiatal hernia 04/01/2022   Dystrophies primarily involving the retinal pigment epithelium 01/20/2020   Intermediate stage nonexudative age-related macular degeneration of both eyes 01/20/2020   Vitreomacular adhesion of right eye 01/20/2020   Advanced nonexudative age-related macular degeneration of both eyes with subfoveal involvement 01/20/2020   Neck pain 04/03/2018   Low back pain 10/15/2016   SOB (shortness of breath) 08/28/2016   Dysphagia 08/07/2016   Lichen planus 11/16/2015   B12 deficiency 11/12/2015   Routine general medical examination at a health care  facility 11/12/2015   URI (upper respiratory infection) 07/30/2015   Toenail deformity 05/25/2015   Adjustment disorder with mixed anxiety and depressed mood 03/07/2015   Hyperlipidemia associated with type 2 diabetes mellitus (HCC)    Peripheral neuropathy 05/19/2012   Vitamin D  deficiency disease 05/19/2012   Osteopenia 05/19/2012   Osteoarthritis 05/19/2012   Kidney stones 05/19/2012   Diet-controlled type 2 diabetes mellitus (HCC) 05/19/2012    PCP: Adelia Homestead, MD  REFERRING PROVIDER: Adelia Homestead, MD  REFERRING DIAG: R29.898 (ICD-10-CM) - Weakness of lower extremity, unspecified laterality  THERAPY DIAG:  Muscle weakness (generalized)  Difficulty in walking, not elsewhere classified  Rationale for Evaluation and Treatment: Rehabilitation  ONSET DATE: about a year ago.   SUBJECTIVE:   SUBJECTIVE STATEMENT: 12/08/2023 Reports she is having less cramps occasional in calves. States her butt is sore.  Eval: Patient reports that she started taking statins and noticed weakness in her legs.  She has been off of them for about 8 months and has noticed a slight increase in strength but still has pain and strength deficits particularly in the groin region.  Sometimes the cramps will wake her up at night and she definitely gets them in the morning.  Reports she does have a history of a B12 deficiency and gets injections regularly.  Reports she takes baclofen   at night which helps with her cramps but she still gets them.  She is very active in the garden and dances but does not perform any formal workout routine.  Reports her back is sore after prolonged bending or gardening work.   States her cramps are worse after she does a lot of work in the yard  PERTINENT HISTORY: History of statin use, history of B12 deficiency PAIN:  No pain  PRECAUTIONS: None  RED FLAGS: None   WEIGHT BEARING RESTRICTIONS: No  FALLS:  Has patient fallen in last 6 months?  No    OCCUPATION: not currently working  PLOF: Independent  PATIENT GOALS: to get stronger     OBJECTIVE:  Note: Objective measures were completed at Evaluation unless otherwise noted.  DIAGNOSTIC FINDINGS: no recent imaging on file  PATIENT SURVEYS:  Patient-specific activity functional scoring scheme (Point to one number):  0 represents "unable to perform." 10 represents "able to perform at prior level. 0 1 2 3 4 5 6 7 8 9  10 (Date and Score) Activity Initial  Activity Eval     Working in the garden  3    Walking >20 minutes  5    Sleeping  5    Additional Additional Total score = sum of the activity scores/number of activities Minimum detectable change (90%CI) for average score = 2 points Minimum detectable change (90%CI) for single activity score = 3 points PSFS developed by: [place] Spitz., & Binkley, J. (1995). Assessing disability and change on individual  patients: a report of a patient specific measure. Physiotherapy [place], 47, [phone]. Reproduced with the permission of the authors  Score: 13/3=4.33   COGNITION: Overall cognitive status: Within functional limits for tasks assessed     SENSATION: Hypersensitivity to light touch in B LE R>L  EDEMA:  Noted slight edema in both LE    POSTURE: rounded shoulders  PALPATION: Tenderness to palpation along B LE and in right glutes    LE Measurements Lower Extremity Right EVAL Left EVAL   A/PROM MMT A/PROM MMT  Hip Flexion WFL 3+* WFL 3+*  Hip Extension  3+  3+  Hip Abduction      Hip Adduction      Hip Internal rotation [facility]  [facility]   Hip External rotation [facility]  WFL   Knee Flexion  4  4  Knee Extension  4+  4+  Ankle Dorsiflexion      Ankle Plantarflexion      Ankle Inversion      Ankle Eversion       (Blank rows = not tested) * pain   LOWER EXTREMITY SPECIAL TESTS:  Tandem 30 seconds B increased sway with right posterior, SLS left 30 seconds, SLS right 10  seconds Steps- unable to go up and down stairs with control without use of UE  TREATMENT DATE:   12/08/2023  Therapeutic Exercise:  Aerobic: Supine: shoulder flexion with breath work 3 minutes, LTR 2 minutes, full body stretch one side at a time x5 10 holds B, bent knee fall outs 2 minutes  Review of HEP   Prone:  Seated:  Standing:  shoulder flexion 3 minutes at wall     Standing:   Neuromuscular Re-education: long exhale breathing rationale and reasoning behind as well a anatomy- 8 minutes  Manual Therapy:  Therapeutic Activity: Self Care:   Trigger Point Dry Needling:  Modalities:    PATIENT EDUCATION:  Education details: on HEP Person educated: Patient Education method: Explanation, Facilities manager, and Handouts Education comprehension: verbalized understanding   HOME EXERCISE PROGRAM: [id number] Goddess pose  ASSESSMENT:  CLINICAL IMPRESSION: 12/08/2023  Focused on mobility and core activation. Tolerated well. Cramping at times but this reduced with repetition. Added new exercises to HEP. No symptoms noted end of session. Overall patient doing well and will continue with current POC as tolerated.   Eval: Patient presents to physical therapy with complaints of bilateral lower extremity weakness, pain and balance deficits.  Symptoms started after use of statins and has improved over the last 8 months but is still limiting her function at home and in the community.  Patient presents with weakness, spasms and pain that is greatly limiting current presentation.  Educated patient on gentle stretching and muscle activation exercises which were tolerated moderately well.  Patient would greatly benefit from skilled PT to improve overall functional strength and mobility to maintain independence at home.  OBJECTIVE IMPAIRMENTS: decreased activity  tolerance, decreased balance, decreased ROM, decreased strength, improper body mechanics, postural dysfunction, and pain.   ACTIVITY LIMITATIONS: standing, sleeping, stairs, and locomotion level  PARTICIPATION LIMITATIONS: community activity and yard work  PERSONAL FACTORS: Age, Fitness, and Time since onset of injury/illness/exacerbation are also affecting patient's functional outcome.   REHAB POTENTIAL: Good  CLINICAL DECISION MAKING: Stable/uncomplicated  EVALUATION COMPLEXITY: Low   GOALS: Goals reviewed with patient? yes  SHORT TERM GOALS: Target date: 12/24/2023   Patient will be independent in self management strategies to improve quality of life and functional outcomes. Baseline: New Program Goal status: INITIAL  2.  Patient will report at least 50% improvement in overall symptoms and/or function to demonstrate improved functional mobility Baseline: 0% better Goal status: INITIAL  3.  Patient will report no cramps in the morning to improve sleep tolerance Baseline: Currently getting cramps Goal status: INITIAL     LONG TERM GOALS: Target date: 02/04/2024   Patient will report at least 75% improvement in overall symptoms and/or function to demonstrate improved functional mobility Baseline: 0% better Goal status: INITIAL  2.  Patient will score at least 2 points higher on PSFS average to demonstrate change in overall function. Baseline: see above Goal status: INITIAL  3.  Patient will be able to stand on either leg and single-leg stance for at least 30 seconds to demonstrate improved static balance Baseline:  Goal status: INITIAL    PLAN:  PT FREQUENCY: 1-2x/week for a total of 16 visits over 12 week certification period   PT DURATION: 12 weeks  PLANNED INTERVENTIONS: 97110-Therapeutic exercises, 97530- Therapeutic activity, 97112- Neuromuscular re-education, 97535- Self Care, 16109- Manual therapy, [phone]- Gait training, [phone]- Orthotic Fit/training, [phone]-  Canalith repositioning, [id number]- Aquatic Therapy, 97014- Electrical stimulation (unattended), [phone]- Ionotophoresis 4mg /ml Dexamethasone, Patient/Family education, Balance training, Stair training, Taping, Dry Needling, Joint mobilization, Joint manipulation, Spinal manipulation, Spinal mobilization, Cryotherapy, and Moist heat  PLAN FOR NEXT SESSION: STM, hip ROM and strengthening, core strengthening    2:40 PM, 12/08/23 Tabitha Ewings, DPT Physical Therapy with [facility]

## 2023-12-11 ENCOUNTER — Ambulatory Visit: Admitting: Physical Therapy

## 2023-12-11 ENCOUNTER — Encounter: Payer: Self-pay | Admitting: Physical Therapy

## 2023-12-11 DIAGNOSIS — M6281 Muscle weakness (generalized): Secondary | ICD-10-CM | POA: Diagnosis not present

## 2023-12-11 DIAGNOSIS — R262 Difficulty in walking, not elsewhere classified: Secondary | ICD-10-CM

## 2023-12-11 NOTE — Therapy (Signed)
 OUTPATIENT PHYSICAL THERAPY LOWER EXTREMITY TREATMENT   Patient Name: Erica [name] MRN: [id number] DOB:28-Jan-1941, 83 y.o., female Today's Date: 12/11/2023  END OF SESSION:  PT End of Session - 12/11/23 1352     Visit Number 6    Number of Visits 16    Date for PT Re-Evaluation 02/04/24    Authorization Type humana - auth required- 12 approved from 5/21 to 7/4    Authorization - Visit Number 6    Authorization - Number of Visits 12    Progress Note Due on Visit 10    PT Start Time 1352    PT Stop Time 1432    PT Time Calculation (min) 40 min    Activity Tolerance Patient tolerated treatment well    Behavior During Therapy [facility] for tasks assessed/performed          Past Medical History:  Diagnosis Date   Allergic rhinitis    Anemia    Arthritis    Asthma    Diet-controlled type 2 diabetes mellitus (HCC) 09/2011 dx   Diverticulosis    Dyslipidemia    Macular degeneration    genetic etiology, not age-related per retinal specailist   Recurrent kidney stones    Past Surgical History:  Procedure Laterality Date   CATARACT EXTRACTION Bilateral 01/2015   L on 8/8, R on 8/22   DILATION AND CURETTAGE OF UTERUS     TONSILLECTOMY AND ADENOIDECTOMY     Patient Active Problem List   Diagnosis Date Noted   Statin myopathy 09/23/2023   Low serum albumin 12/19/2022   Weakness of lower extremity 12/19/2022   Hiatal hernia 04/01/2022   Dystrophies primarily involving the retinal pigment epithelium 01/20/2020   Intermediate stage nonexudative age-related macular degeneration of both eyes 01/20/2020   Vitreomacular adhesion of right eye 01/20/2020   Advanced nonexudative age-related macular degeneration of both eyes with subfoveal involvement 01/20/2020   Neck pain 04/03/2018   Low back pain 10/15/2016   SOB (shortness of breath) 08/28/2016   Dysphagia 08/07/2016   Lichen planus 11/16/2015   B12 deficiency 11/12/2015   Routine general medical examination at a health care  facility 11/12/2015   URI (upper respiratory infection) 07/30/2015   Toenail deformity 05/25/2015   Adjustment disorder with mixed anxiety and depressed mood 03/07/2015   Hyperlipidemia associated with type 2 diabetes mellitus (HCC)    Peripheral neuropathy 05/19/2012   Vitamin D  deficiency disease 05/19/2012   Osteopenia 05/19/2012   Osteoarthritis 05/19/2012   Kidney stones 05/19/2012   Diet-controlled type 2 diabetes mellitus (HCC) 05/19/2012    PCP: Adelia Homestead, MD  REFERRING PROVIDER: Adelia Homestead, MD  REFERRING DIAG: R29.898 (ICD-10-CM) - Weakness of lower extremity, unspecified laterality  THERAPY DIAG:  Muscle weakness (generalized)  Difficulty in walking, not elsewhere classified  Rationale for Evaluation and Treatment: Rehabilitation  ONSET DATE: about a year ago.   SUBJECTIVE:   SUBJECTIVE STATEMENT: 12/11/2023 No pain slight soreness in her butt was working in the garden. The cramping is lessening significantly. Reports  she feels about 75-80% better since the start of PT. Still having some morning craping  Eval: Patient reports that she started taking statins and noticed weakness in her legs.  She has been off of them for about 8 months and has noticed a slight increase in strength but still has pain and strength deficits particularly in the groin region.  Sometimes the cramps will wake her up at night and she definitely gets them in the morning.  Reports she does have a history of a B12 deficiency and gets injections regularly.  Reports she takes baclofen  at night which helps with her cramps but she still gets them.  She is very active in the garden and dances but does not perform any formal workout routine.  Reports her back is sore after prolonged bending or gardening work.   States her cramps are worse after she does a lot of work in the yard  PERTINENT HISTORY: History of statin use, history of B12 deficiency PAIN:  No pain  PRECAUTIONS:  None  RED FLAGS: None   WEIGHT BEARING RESTRICTIONS: No  FALLS:  Has patient fallen in last 6 months? No    OCCUPATION: not currently working  PLOF: Independent  PATIENT GOALS: to get stronger     OBJECTIVE:  Note: Objective measures were completed at Evaluation unless otherwise noted.  DIAGNOSTIC FINDINGS: no recent imaging on file  PATIENT SURVEYS:  Patient-specific activity functional scoring scheme (Point to one number):  0 represents "unable to perform." 10 represents "able to perform at prior level. 0 1 2 3 4 5 6 7 8 9  10 (Date and Score) Activity Initial  Activity Eval  6/19   Working in the garden  3  8  Walking >20 minutes  5  8  Sleeping  5 8   Additional Additional Total score = sum of the activity scores/number of activities Minimum detectable change (90%CI) for average score = 2 points Minimum detectable change (90%CI) for single activity score = 3 points PSFS developed by: [place] Spitz., & Binkley, J. (1995). Assessing disability and change on individual  patients: a report of a patient specific measure. Physiotherapy [place], 47, [phone]. Reproduced with the permission of the authors  Score: Eval:  13/3=4.33  6/19   24/3=8   COGNITION: Overall cognitive status: Within functional limits for tasks assessed     SENSATION: Hypersensitivity to light touch in B LE R>L  EDEMA:  Noted slight edema in both LE    POSTURE: rounded shoulders  PALPATION: Tenderness to palpation along B LE and in right glutes    LE Measurements Lower Extremity Right EVAL Left EVAL   A/PROM MMT A/PROM MMT  Hip Flexion WFL 3+* WFL 3+*  Hip Extension  3+  3+  Hip Abduction      Hip Adduction      Hip Internal rotation [facility]  [facility]   Hip External rotation [facility]  WFL   Knee Flexion  4  4  Knee Extension  4+  4+  Ankle Dorsiflexion      Ankle Plantarflexion      Ankle Inversion      Ankle Eversion       (Blank rows = not tested) *  pain   LOWER EXTREMITY SPECIAL TESTS:  Tandem 30 seconds B increased sway with right posterior, SLS left 30 seconds, SLS right 10 seconds Steps- unable to go up and down stairs with control without use of UE  TREATMENT DATE:   12/11/2023  Therapeutic Exercise:  Aerobic: Supine:   Review of HEP/consolidation of program and frequency of different exercises Objective measures updated    Prone:  Seated:  Standing:  shoulder flexion 3 minutes at wall     Standing:   Neuromuscular Re-education: long exhale breathing rationale and reasoning behind as well a anatomy- 8 minutes  Manual Therapy:  Therapeutic Activity: Self Care:   Trigger Point Dry Needling:  Modalities:    PATIENT EDUCATION:  Education details: on HEP Person educated: Patient Education method: Explanation, Facilities manager, and Handouts Education comprehension: verbalized understanding   HOME EXERCISE PROGRAM: [id number] Goddess pose  ASSESSMENT:  CLINICAL IMPRESSION: 12/11/2023  Focused on consolidating HEP and answering all questions about current exercises. Reviewed with patent and discussed how to modify if need be. Educated patient in plan moving forward. Some objective measures updated and patient has met 2 short and 2 long term goals at this time. Will continue with current POC as tolerated.   Eval: Patient presents to physical therapy with complaints of bilateral lower extremity weakness, pain and balance deficits.  Symptoms started after use of statins and has improved over the last 8 months but is still limiting her function at home and in the community.  Patient presents with weakness, spasms and pain that is greatly limiting current presentation.  Educated patient on gentle stretching and muscle activation exercises which were tolerated moderately well.  Patient would greatly  benefit from skilled PT to improve overall functional strength and mobility to maintain independence at home.  OBJECTIVE IMPAIRMENTS: decreased activity tolerance, decreased balance, decreased ROM, decreased strength, improper body mechanics, postural dysfunction, and pain.   ACTIVITY LIMITATIONS: standing, sleeping, stairs, and locomotion level  PARTICIPATION LIMITATIONS: community activity and yard work  PERSONAL FACTORS: Age, Fitness, and Time since onset of injury/illness/exacerbation are also affecting patient's functional outcome.   REHAB POTENTIAL: Good  CLINICAL DECISION MAKING: Stable/uncomplicated  EVALUATION COMPLEXITY: Low   GOALS: Goals reviewed with patient? yes  SHORT TERM GOALS: Target date: 12/24/2023   Patient will be independent in self management strategies to improve quality of life and functional outcomes. Baseline: New Program Goal status: MET  2.  Patient will report at least 50% improvement in overall symptoms and/or function to demonstrate improved functional mobility Baseline: 0% better Goal status: MET   3.  Patient will report no cramps in the morning to improve sleep tolerance Baseline: Currently getting cramps Goal status: PROGRESSING      LONG TERM GOALS: Target date: 02/04/2024   Patient will report at least 75% improvement in overall symptoms and/or function to demonstrate improved functional mobility Baseline: 0% better Goal status: MET  2.  Patient will score at least 2 points higher on PSFS average to demonstrate change in overall function. Baseline: see above Goal status: MET  3.  Patient will be able to stand on either leg and single-leg stance for at least 30 seconds to demonstrate improved static balance Baseline:  Goal status: INITIAL    PLAN:  PT FREQUENCY: 1-2x/week for a total of 16 visits over 12 week certification period   PT DURATION: 12 weeks  PLANNED INTERVENTIONS: 97110-Therapeutic exercises, 97530- Therapeutic  activity, 97112- Neuromuscular re-education, 97535- Self Care, 16109- Manual therapy, [phone]- Gait training, [phone]- Orthotic Fit/training, [phone]- Canalith repositioning, [id number]- Aquatic Therapy, 97014- Electrical stimulation (unattended), [phone]- Ionotophoresis 4mg /ml Dexamethasone, Patient/Family education, Balance training, Stair training, Taping, Dry Needling, Joint mobilization, Joint manipulation, Spinal manipulation, Spinal mobilization, Cryotherapy, and Moist heat  PLAN FOR NEXT SESSION: PN, STM, hip ROM and strengthening, core strengthening,  ADD more CORE exercises/ balance to HEP    2:34 PM, 12/11/23 Tabitha Ewings, DPT Physical Therapy with 

## 2023-12-15 ENCOUNTER — Encounter: Payer: Self-pay | Admitting: Physical Therapy

## 2023-12-15 ENCOUNTER — Ambulatory Visit: Admitting: Physical Therapy

## 2023-12-15 DIAGNOSIS — M6281 Muscle weakness (generalized): Secondary | ICD-10-CM | POA: Diagnosis not present

## 2023-12-15 DIAGNOSIS — R262 Difficulty in walking, not elsewhere classified: Secondary | ICD-10-CM

## 2023-12-15 NOTE — Therapy (Signed)
 OUTPATIENT PHYSICAL THERAPY LOWER EXTREMITY TREATMENT   Patient Name: Erica [name] MRN: [id number] DOB:July 01, 1940, 83 y.o., female Today's Date: 12/15/2023  END OF SESSION:  PT End of Session - 12/15/23 1348     Visit Number 7    Number of Visits 16    Date for PT Re-Evaluation 02/04/24    Authorization Type humana - auth required- 12 approved from 5/21 to 7/4    Authorization - Visit Number 7    Authorization - Number of Visits 12    Progress Note Due on Visit 10    PT Start Time 1350    PT Stop Time 1428    PT Time Calculation (min) 38 min    Activity Tolerance Patient tolerated treatment well    Behavior During Therapy [facility] for tasks assessed/performed          Past Medical History:  Diagnosis Date   Allergic rhinitis    Anemia    Arthritis    Asthma    Diet-controlled type 2 diabetes mellitus (HCC) 09/2011 dx   Diverticulosis    Dyslipidemia    Macular degeneration    genetic etiology, not age-related per retinal specailist   Recurrent kidney stones    Past Surgical History:  Procedure Laterality Date   CATARACT EXTRACTION Bilateral 01/2015   L on 8/8, R on 8/22   DILATION AND CURETTAGE OF UTERUS     TONSILLECTOMY AND ADENOIDECTOMY     Patient Active Problem List   Diagnosis Date Noted   Statin myopathy 09/23/2023   Low serum albumin 12/19/2022   Weakness of lower extremity 12/19/2022   Hiatal hernia 04/01/2022   Dystrophies primarily involving the retinal pigment epithelium 01/20/2020   Intermediate stage nonexudative age-related macular degeneration of both eyes 01/20/2020   Vitreomacular adhesion of right eye 01/20/2020   Advanced nonexudative age-related macular degeneration of both eyes with subfoveal involvement 01/20/2020   Neck pain 04/03/2018   Low back pain 10/15/2016   SOB (shortness of breath) 08/28/2016   Dysphagia 08/07/2016   Lichen planus 11/16/2015   B12 deficiency 11/12/2015   Routine general medical examination at a health care  facility 11/12/2015   URI (upper respiratory infection) 07/30/2015   Toenail deformity 05/25/2015   Adjustment disorder with mixed anxiety and depressed mood 03/07/2015   Hyperlipidemia associated with type 2 diabetes mellitus (HCC)    Peripheral neuropathy 05/19/2012   Vitamin D  deficiency disease 05/19/2012   Osteopenia 05/19/2012   Osteoarthritis 05/19/2012   Kidney stones 05/19/2012   Diet-controlled type 2 diabetes mellitus (HCC) 05/19/2012    PCP: Rollene Almarie LABOR, MD  REFERRING PROVIDER: Rollene Almarie LABOR, MD  REFERRING DIAG: R29.898 (ICD-10-CM) - Weakness of lower extremity, unspecified laterality  THERAPY DIAG:  Muscle weakness (generalized)  Difficulty in walking, not elsewhere classified  Rationale for Evaluation and Treatment: Rehabilitation  ONSET DATE: about a year ago.   SUBJECTIVE:   SUBJECTIVE STATEMENT: 12/15/2023 States she has some neuropathy that is bothering her.   Eval: Patient reports that she started taking statins and noticed weakness in her legs.  She has been off of them for about 8 months and has noticed a slight increase in strength but still has pain and strength deficits particularly in the groin region.  Sometimes the cramps will wake her up at night and she definitely gets them in the morning.  Reports she does have a history of a B12 deficiency and gets injections regularly.  Reports she takes baclofen  at night which helps  with her cramps but she still gets them.  She is very active in the garden and dances but does not perform any formal workout routine.  Reports her back is sore after prolonged bending or gardening work.   States her cramps are worse after she does a lot of work in the yard  PERTINENT HISTORY: History of statin use, history of B12 deficiency PAIN:  No pain  PRECAUTIONS: None  RED FLAGS: None   WEIGHT BEARING RESTRICTIONS: No  FALLS:  Has patient fallen in last 6 months? No    OCCUPATION: not  currently working  PLOF: Independent  PATIENT GOALS: to get stronger     OBJECTIVE:  Note: Objective measures were completed at Evaluation unless otherwise noted.  DIAGNOSTIC FINDINGS: no recent imaging on file  PATIENT SURVEYS:  Patient-specific activity functional scoring scheme (Point to one number):  0 represents "unable to perform." 10 represents "able to perform at prior level. 0 1 2 3 4 5 6 7 8 9  10 (Date and Score) Activity Initial  Activity Eval  6/19   Working in the garden  3  8  Walking >20 minutes  5  8  Sleeping  5 8   Additional Additional Total score = sum of the activity scores/number of activities Minimum detectable change (90%CI) for average score = 2 points Minimum detectable change (90%CI) for single activity score = 3 points PSFS developed by: Rosalee MYRTIS Marvis KYM Charlet CHRISTELLA., & Binkley, J. (1995). Assessing disability and change on individual  patients: a report of a patient specific measure. Physiotherapy [place], 47, [phone]. Reproduced with the permission of the authors  Score: Eval:  13/3=4.33  6/19   24/3=8   COGNITION: Overall cognitive status: Within functional limits for tasks assessed     SENSATION: Hypersensitivity to light touch in B LE R>L  EDEMA:  Noted slight edema in both LE    POSTURE: rounded shoulders  PALPATION: Tenderness to palpation along B LE and in right glutes    LE Measurements Lower Extremity Right EVAL Left EVAL   A/PROM MMT A/PROM MMT  Hip Flexion WFL 3+* WFL 3+*  Hip Extension  3+  3+  Hip Abduction      Hip Adduction      Hip Internal rotation [facility]   Hip External rotation [facility]  WFL   Knee Flexion  4  4  Knee Extension  4+  4+  Ankle Dorsiflexion      Ankle Plantarflexion      Ankle Inversion      Ankle Eversion       (Blank rows = not tested) * pain   LOWER EXTREMITY SPECIAL TESTS:  Tandem 30 seconds B increased sway with right posterior, SLS left 30 seconds, SLS right 10  seconds Steps- unable to go up and down stairs with control without use of UE  TREATMENT DATE:   12/15/2023  Therapeutic Exercise:  Aerobic: Supine:       Prone:  Seated: piriformis stretch x3 30 holds B with strap and elevated height chair Standing:   ankle/toe pumps at counter - tolerated well     Standing:   Neuromuscular Re-education: vibration for pain gaiting for neuropathy - 10 minutes Manual Therapy:  Therapeutic Activity: Self Care:  educated patient on different conditions and ways to help with them (neuropathy pathology  and treatments ) on how to use butler for donning compression garments Trigger Point Dry Needling:  Modalities:    PATIENT EDUCATION:  Education details: on HEP Person educated: Patient Education method: Programmer, multimedia, Facilities manager, and Handouts Education comprehension: verbalized understanding   HOME EXERCISE PROGRAM: [id number] Goddess pose  ASSESSMENT:  CLINICAL IMPRESSION: 12/15/2023 Session focused on answering all questions. Tolerated vibration well with reduced symptoms end of session. Focused on education and will complete PN next session. Overall patient doing well and will continue to benefit from skilled PT.   Eval: Patient presents to physical therapy with complaints of bilateral lower extremity weakness, pain and balance deficits.  Symptoms started after use of statins and has improved over the last 8 months but is still limiting her function at home and in the community.  Patient presents with weakness, spasms and pain that is greatly limiting current presentation.  Educated patient on gentle stretching and muscle activation exercises which were tolerated moderately well.  Patient would greatly benefit from skilled PT to improve overall functional strength and mobility to maintain independence at  home.  OBJECTIVE IMPAIRMENTS: decreased activity tolerance, decreased balance, decreased ROM, decreased strength, improper body mechanics, postural dysfunction, and pain.   ACTIVITY LIMITATIONS: standing, sleeping, stairs, and locomotion level  PARTICIPATION LIMITATIONS: community activity and yard work  PERSONAL FACTORS: Age, Fitness, and Time since onset of injury/illness/exacerbation are also affecting patient's functional outcome.   REHAB POTENTIAL: Good  CLINICAL DECISION MAKING: Stable/uncomplicated  EVALUATION COMPLEXITY: Low   GOALS: Goals reviewed with patient? yes  SHORT TERM GOALS: Target date: 12/24/2023   Patient will be independent in self management strategies to improve quality of life and functional outcomes. Baseline: New Program Goal status: MET  2.  Patient will report at least 50% improvement in overall symptoms and/or function to demonstrate improved functional mobility Baseline: 0% better Goal status: MET   3.  Patient will report no cramps in the morning to improve sleep tolerance Baseline: Currently getting cramps Goal status: PROGRESSING      LONG TERM GOALS: Target date: 02/04/2024   Patient will report at least 75% improvement in overall symptoms and/or function to demonstrate improved functional mobility Baseline: 0% better Goal status: MET  2.  Patient will score at least 2 points higher on PSFS average to demonstrate change in overall function. Baseline: see above Goal status: MET  3.  Patient will be able to stand on either leg and single-leg stance for at least 30 seconds to demonstrate improved static balance Baseline:  Goal status: INITIAL    PLAN:  PT FREQUENCY: 1-2x/week for a total of 16 visits over 12 week certification period   PT DURATION: 12 weeks  PLANNED INTERVENTIONS: 97110-Therapeutic exercises, 97530- Therapeutic activity, 97112- Neuromuscular re-education, 97535- Self Care, [id number]- Manual therapy, [phone]- Gait  training, [phone]- Orthotic Fit/training, [phone]- Canalith repositioning, [id number]- Aquatic Therapy, 97014- Electrical stimulation (unattended), [phone]- Ionotophoresis 4mg /ml Dexamethasone, Patient/Family education, Balance training, Stair training, Taping, Dry Needling, Joint mobilization, Joint manipulation, Spinal manipulation, Spinal mobilization, Cryotherapy, and Moist  heat   PLAN FOR NEXT SESSION: PN, STM, hip ROM and strengthening, core strengthening,  ADD more CORE exercises/ balance to HEP    1:48 PM, 12/15/23 Olivia Church, DPT Physical Therapy with [place]

## 2023-12-17 ENCOUNTER — Ambulatory Visit: Admitting: Physical Therapy

## 2023-12-17 ENCOUNTER — Encounter: Payer: Self-pay | Admitting: Physical Therapy

## 2023-12-17 DIAGNOSIS — M6281 Muscle weakness (generalized): Secondary | ICD-10-CM

## 2023-12-17 DIAGNOSIS — R262 Difficulty in walking, not elsewhere classified: Secondary | ICD-10-CM | POA: Diagnosis not present

## 2023-12-17 NOTE — Therapy (Signed)
 OUTPATIENT PHYSICAL THERAPY LOWER EXTREMITY TREATMENT   Patient Name: SHARDE GOVER MRN: [id number] DOB:09-07-1940, 83 y.o., female Today's Date: 12/17/2023  END OF SESSION:  PT End of Session - 12/17/23 1345     Visit Number 8    Number of Visits 16    Date for PT Re-Evaluation 02/04/24    Authorization Type humana - auth required- 12 approved from 5/21 to 7/4    Authorization - Visit Number 8    Authorization - Number of Visits 12    Progress Note Due on Visit 10    PT Start Time 1347    PT Stop Time 1427    PT Time Calculation (min) 40 min    Activity Tolerance Patient tolerated treatment well    Behavior During Therapy [facility] for tasks assessed/performed          Past Medical History:  Diagnosis Date   Allergic rhinitis    Anemia    Arthritis    Asthma    Diet-controlled type 2 diabetes mellitus (HCC) 09/2011 dx   Diverticulosis    Dyslipidemia    Macular degeneration    genetic etiology, not age-related per retinal specailist   Recurrent kidney stones    Past Surgical History:  Procedure Laterality Date   CATARACT EXTRACTION Bilateral 01/2015   L on 8/8, R on 8/22   DILATION AND CURETTAGE OF UTERUS     TONSILLECTOMY AND ADENOIDECTOMY     Patient Active Problem List   Diagnosis Date Noted   Statin myopathy 09/23/2023   Low serum albumin 12/19/2022   Weakness of lower extremity 12/19/2022   Hiatal hernia 04/01/2022   Dystrophies primarily involving the retinal pigment epithelium 01/20/2020   Intermediate stage nonexudative age-related macular degeneration of both eyes 01/20/2020   Vitreomacular adhesion of right eye 01/20/2020   Advanced nonexudative age-related macular degeneration of both eyes with subfoveal involvement 01/20/2020   Neck pain 04/03/2018   Low back pain 10/15/2016   SOB (shortness of breath) 08/28/2016   Dysphagia 08/07/2016   Lichen planus 11/16/2015   B12 deficiency 11/12/2015   Routine general medical examination at a health care  facility 11/12/2015   URI (upper respiratory infection) 07/30/2015   Toenail deformity 05/25/2015   Adjustment disorder with mixed anxiety and depressed mood 03/07/2015   Hyperlipidemia associated with type 2 diabetes mellitus (HCC)    Peripheral neuropathy 05/19/2012   Vitamin D  deficiency disease 05/19/2012   Osteopenia 05/19/2012   Osteoarthritis 05/19/2012   Kidney stones 05/19/2012   Diet-controlled type 2 diabetes mellitus (HCC) 05/19/2012    PCP: Rollene Almarie LABOR, MD  REFERRING PROVIDER: Rollene Almarie LABOR, MD  REFERRING DIAG: R29.898 (ICD-10-CM) - Weakness of lower extremity, unspecified laterality  THERAPY DIAG:  Muscle weakness (generalized)  Difficulty in walking, not elsewhere classified  Rationale for Evaluation and Treatment: Rehabilitation  ONSET DATE: about a year ago.   SUBJECTIVE:   SUBJECTIVE STATEMENT: 12/17/2023 States she wore the stockings for 2 days and doesn't know if that caused itchiness in both legs but also had dessert  and she is sensitive to sugar.   Eval: Patient reports that she started taking statins and noticed weakness in her legs.  She has been off of them for about 8 months and has noticed a slight increase in strength but still has pain and strength deficits particularly in the groin region.  Sometimes the cramps will wake her up at night and she definitely gets them in the morning.  Reports she does  have a history of a B12 deficiency and gets injections regularly.  Reports she takes baclofen  at night which helps with her cramps but she still gets them.  She is very active in the garden and dances but does not perform any formal workout routine.  Reports her back is sore after prolonged bending or gardening work.   States her cramps are worse after she does a lot of work in the yard  PERTINENT HISTORY: History of statin use, history of B12 deficiency PAIN:  No pain  PRECAUTIONS: None  RED FLAGS: None   WEIGHT BEARING  RESTRICTIONS: No  FALLS:  Has patient fallen in last 6 months? No    OCCUPATION: not currently working  PLOF: Independent  PATIENT GOALS: to get stronger     OBJECTIVE:  Note: Objective measures were completed at Evaluation unless otherwise noted.  DIAGNOSTIC FINDINGS: no recent imaging on file  PATIENT SURVEYS:  Patient-specific activity functional scoring scheme (Point to one number):  0 represents "unable to perform." 10 represents "able to perform at prior level. 0 1 2 3 4 5 6 7 8 9  10 (Date and Score) Activity Initial  Activity Eval  6/19   Working in the garden  3  8  Walking >20 minutes  5  8  Sleeping  5 8   Additional Additional Total score = sum of the activity scores/number of activities Minimum detectable change (90%CI) for average score = 2 points Minimum detectable change (90%CI) for single activity score = 3 points PSFS developed by: Rosalee MYRTIS Marvis KYM Charlet CHRISTELLA., & Binkley, J. (1995). Assessing disability and change on individual  patients: a report of a patient specific measure. Physiotherapy [place], 47, [phone]. Reproduced with the permission of the authors  Score: Eval:  13/3=4.33  6/19   24/3=8   COGNITION: Overall cognitive status: Within functional limits for tasks assessed     SENSATION: Hypersensitivity to light touch in B LE R>L  EDEMA:  Noted slight edema in both LE    POSTURE: rounded shoulders  PALPATION: Tenderness to palpation along B LE and in right glutes    LE Measurements Lower Extremity Right 6/25 Left 6/25   A/PROM MMT A/PROM MMT  Hip Flexion WFL 4 WFL 4  Hip Extension  4  4  Hip Abduction      Hip Adduction      Hip Internal rotation [facility]  [facility]   Hip External rotation [facility]  WFL   Knee Flexion  4+  4*  Knee Extension  4+  4+  Ankle Dorsiflexion      Ankle Plantarflexion      Ankle Inversion      Ankle Eversion       (Blank rows = not tested) * pain   LOWER EXTREMITY SPECIAL TESTS:  Tandem  30 seconds B increased sway with right posterior, SLS left 30 seconds, SLS right 18 seconds Steps- unable to go up and down stairs with control without use of UE  TREATMENT DATE:   12/17/2023  Therapeutic Exercise:  Aerobic:objective measurements updated Supine:     Prone:  Standing:        Standing:   Neuromuscular Re-education:paloff press 3x10 B red band, shoulder extension 2x15 5 holds RTB- reviewed progressions  Manual Therapy:  Therapeutic Activity: Self Care:  educated patient on different conditions and ways to help with poor sugar tolerance and strategies to help with that. - fiber/ Artist Dry Needling:  Modalities:    PATIENT EDUCATION:  Education details: on HEP, on swelling management, on basic strategies for sugar management and MD f/u with concerns of DB. Person educated: Patient Education method: Explanation, Demonstration, and Handouts Education comprehension: verbalized understanding   HOME EXERCISE PROGRAM: [id number] Goddess pose  ASSESSMENT:  CLINICAL IMPRESSION: 12/17/2023 Session focused on answering all questions and updating objective measurements. Continues to have difficulties with balance but still improved compared to previous session. Advised MD f/u secondary to questions about DB and sugar tolerance. Added new exercises to HEP. Will continue with current POC as tolerated.   Eval: Patient presents to physical therapy with complaints of bilateral lower extremity weakness, pain and balance deficits.  Symptoms started after use of statins and has improved over the last 8 months but is still limiting her function at home and in the community.  Patient presents with weakness, spasms and pain that is greatly limiting current presentation.  Educated patient on gentle stretching and muscle activation  exercises which were tolerated moderately well.  Patient would greatly benefit from skilled PT to improve overall functional strength and mobility to maintain independence at home.  OBJECTIVE IMPAIRMENTS: decreased activity tolerance, decreased balance, decreased ROM, decreased strength, improper body mechanics, postural dysfunction, and pain.   ACTIVITY LIMITATIONS: standing, sleeping, stairs, and locomotion level  PARTICIPATION LIMITATIONS: community activity and yard work  PERSONAL FACTORS: Age, Fitness, and Time since onset of injury/illness/exacerbation are also affecting patient's functional outcome.   REHAB POTENTIAL: Good  CLINICAL DECISION MAKING: Stable/uncomplicated  EVALUATION COMPLEXITY: Low   GOALS: Goals reviewed with patient? yes  SHORT TERM GOALS: Target date: 12/24/2023   Patient will be independent in self management strategies to improve quality of life and functional outcomes. Baseline: New Program Goal status: MET  2.  Patient will report at least 50% improvement in overall symptoms and/or function to demonstrate improved functional mobility Baseline: 0% better Goal status: MET   3.  Patient will report no cramps in the morning to improve sleep tolerance Baseline: Currently getting cramps Goal status: PROGRESSING      LONG TERM GOALS: Target date: 02/04/2024   Patient will report at least 75% improvement in overall symptoms and/or function to demonstrate improved functional mobility Baseline: 0% better Goal status: MET  2.  Patient will score at least 2 points higher on PSFS average to demonstrate change in overall function. Baseline: see above Goal status: MET  3.  Patient will be able to stand on either leg and single-leg stance for at least 30 seconds to demonstrate improved static balance Baseline:  Goal status: PROGRESSING    PLAN:  PT FREQUENCY: 1-2x/week for a total of 16 visits over 12 week certification period   PT DURATION: 12  weeks  PLANNED INTERVENTIONS: 97110-Therapeutic exercises, 97530- Therapeutic activity, 97112- Neuromuscular re-education, 97535- Self Care, 02859- Manual therapy, [id number]- Gait training, [phone]- Orthotic Fit/training, [phone]- Canalith repositioning, [id number]- Aquatic Therapy, 97014- Electrical stimulation (unattended), [phone]- Ionotophoresis 4mg /ml Dexamethasone, Patient/Family education, Balance training, Stair training, Taping, Dry Needling, Joint mobilization,  Joint manipulation, Spinal manipulation, Spinal mobilization, Cryotherapy, and Moist heat   PLAN FOR NEXT SESSION: core/band exercises STM, hip ROM and strengthening, core strengthening,  ADD more CORE exercises/ balance to HEP    2:30 PM, 12/17/23 Olivia Church, DPT Physical Therapy with [place]

## 2023-12-24 ENCOUNTER — Encounter: Payer: Self-pay | Admitting: Physical Therapy

## 2023-12-24 ENCOUNTER — Ambulatory Visit: Admitting: Physical Therapy

## 2023-12-24 DIAGNOSIS — M6281 Muscle weakness (generalized): Secondary | ICD-10-CM

## 2023-12-24 DIAGNOSIS — R262 Difficulty in walking, not elsewhere classified: Secondary | ICD-10-CM | POA: Diagnosis not present

## 2023-12-24 NOTE — Therapy (Signed)
 OUTPATIENT PHYSICAL THERAPY LOWER EXTREMITY TREATMENT  PHYSICAL THERAPY DISCHARGE SUMMARY  Visits from Start of Care: 9  Current functional level related to goals / functional outcomes: See below   Remaining deficits: See below   Education / Equipment: See below   Patient agrees to discharge. Patient goals were met. Patient is being discharged due to meeting the stated rehab goals.  Patient Name: Erica [name] MRN: [id number] DOB:02-10-1941, 83 y.o., female Today's Date: 12/24/2023  END OF SESSION:  PT End of Session - 12/24/23 1602     Visit Number 9    Number of Visits 16    Date for PT Re-Evaluation 02/04/24    Authorization Type humana - auth required- 12 approved from 5/21 to 7/4    Authorization - Visit Number 9    Authorization - Number of Visits 12    Progress Note Due on Visit 10    PT Start Time 1602    PT Stop Time 1640    PT Time Calculation (min) 38 min    Activity Tolerance Patient tolerated treatment well    Behavior During Therapy [facility] for tasks assessed/performed          Past Medical History:  Diagnosis Date   Allergic rhinitis    Anemia    Arthritis    Asthma    Diet-controlled type 2 diabetes mellitus (HCC) 09/2011 dx   Diverticulosis    Dyslipidemia    Macular degeneration    genetic etiology, not age-related per retinal specailist   Recurrent kidney stones    Past Surgical History:  Procedure Laterality Date   CATARACT EXTRACTION Bilateral 01/2015   L on 8/8, R on 8/22   DILATION AND CURETTAGE OF UTERUS     TONSILLECTOMY AND ADENOIDECTOMY     Patient Active Problem List   Diagnosis Date Noted   Statin myopathy 09/23/2023   Low serum albumin 12/19/2022   Weakness of lower extremity 12/19/2022   Hiatal hernia 04/01/2022   Dystrophies primarily involving the retinal pigment epithelium 01/20/2020   Intermediate stage nonexudative age-related macular degeneration of both eyes 01/20/2020   Vitreomacular adhesion of right eye  01/20/2020   Advanced nonexudative age-related macular degeneration of both eyes with subfoveal involvement 01/20/2020   Neck pain 04/03/2018   Low back pain 10/15/2016   SOB (shortness of breath) 08/28/2016   Dysphagia 08/07/2016   Lichen planus 11/16/2015   B12 deficiency 11/12/2015   Routine general medical examination at a health care facility 11/12/2015   URI (upper respiratory infection) 07/30/2015   Toenail deformity 05/25/2015   Adjustment disorder with mixed anxiety and depressed mood 03/07/2015   Hyperlipidemia associated with type 2 diabetes mellitus (HCC)    Peripheral neuropathy 05/19/2012   Vitamin D  deficiency disease 05/19/2012   Osteopenia 05/19/2012   Osteoarthritis 05/19/2012   Kidney stones 05/19/2012   Diet-controlled type 2 diabetes mellitus (HCC) 05/19/2012    PCP: Rollene Almarie LABOR, MD  REFERRING PROVIDER: Rollene Almarie LABOR, MD  REFERRING DIAG: R29.898 (ICD-10-CM) - Weakness of lower extremity, unspecified laterality  THERAPY DIAG:  Muscle weakness (generalized)  Difficulty in walking, not elsewhere classified  Rationale for Evaluation and Treatment: Rehabilitation  ONSET DATE: about a year ago.   SUBJECTIVE:   SUBJECTIVE STATEMENT: 12/24/2023 States she is having a headache/migraine on and off and it is starting to bother her again  Eval: Patient reports that she started taking statins and noticed weakness in her legs.  She has been off of them for about  8 months and has noticed a slight increase in strength but still has pain and strength deficits particularly in the groin region.  Sometimes the cramps will wake her up at night and she definitely gets them in the morning.  Reports she does have a history of a B12 deficiency and gets injections regularly.  Reports she takes baclofen  at night which helps with her cramps but she still gets them.  She is very active in the garden and dances but does not perform any formal workout routine.  Reports  her back is sore after prolonged bending or gardening work.   States her cramps are worse after she does a lot of work in the yard  PERTINENT HISTORY: History of statin use, history of B12 deficiency PAIN:  Yes migraine intense 5/10  PRECAUTIONS: None  RED FLAGS: None   WEIGHT BEARING RESTRICTIONS: No  FALLS:  Has patient fallen in last 6 months? No    OCCUPATION: not currently working  PLOF: Independent  PATIENT GOALS: to get stronger     OBJECTIVE:  Note: Objective measures were completed at Evaluation unless otherwise noted.  DIAGNOSTIC FINDINGS: no recent imaging on file  PATIENT SURVEYS:  Patient-specific activity functional scoring scheme (Point to one number):  0 represents "unable to perform." 10 represents "able to perform at prior level. 0 1 2 3 4 5 6 7 8 9  10 (Date and Score) Activity Initial  Activity Eval  6/19   Working in the garden  3  8  Walking >20 minutes  5  8  Sleeping  5 8   Additional Additional Total score = sum of the activity scores/number of activities Minimum detectable change (90%CI) for average score = 2 points Minimum detectable change (90%CI) for single activity score = 3 points PSFS developed by: Rosalee MYRTIS Marvis KYM Charlet CHRISTELLA., & Binkley, J. (1995). Assessing disability and change on individual  patients: a report of a patient specific measure. Physiotherapy [place], 47, [phone]. Reproduced with the permission of the authors  Score: Eval:  13/3=4.33  6/19   24/3=8   COGNITION: Overall cognitive status: Within functional limits for tasks assessed     SENSATION: Hypersensitivity to light touch in B LE R>L  EDEMA:  Noted slight edema in both LE    POSTURE: rounded shoulders  PALPATION: Tenderness to palpation along B LE and in right glutes    LE Measurements Lower Extremity Right 6/25 Left 6/25   A/PROM MMT A/PROM MMT  Hip Flexion WFL 4 WFL 4  Hip Extension  4  4  Hip Abduction      Hip  Adduction      Hip Internal rotation [facility]  [facility]   Hip External rotation [facility]  WFL   Knee Flexion  4+  4*  Knee Extension  4+  4+  Ankle Dorsiflexion      Ankle Plantarflexion      Ankle Inversion      Ankle Eversion       (Blank rows = not tested) * pain   LOWER EXTREMITY SPECIAL TESTS:  Tandem 30 seconds B increased sway with right posterior, SLS left 30 seconds, SLS right 18 seconds Steps- unable to go up and down stairs with control without use of UE  TREATMENT DATE:   12/24/2023  Therapeutic Exercise:   Supine:     Prone:  Standing:        Standing:   Neuromuscular Re-education:  Manual Therapy: STM to cervical musculature and facial muscles tolerated well, cervical traction - tolerated well  Therapeutic Activity: Self Care: on self care strategies for migraine/headache management - regular massage, soft tissue work, quality sleep, reduced fluorescent lights, reduced teeth grinding Trigger Point Dry Needling:  Modalities:    PATIENT EDUCATION:  Education details: on HEP, on swelling management, on basic strategies for sugar management and MD f/u with concerns of DB. Person educated: Patient Education method: Explanation, Demonstration, and Handouts Education comprehension: verbalized understanding   HOME EXERCISE PROGRAM: [id number] Goddess pose  ASSESSMENT:  CLINICAL IMPRESSION: 12/24/2023 Session focused on addressing migraine symptoms secondary to limited ability to participate in LE exercises due to pain in head. Focused on education and manual work and significant reduction in symptoms noted. Discussed self massage and ways to help reduce incidence of headache/migraine. Patient to DC from PT secondary to progress made in PT and independence in HEP. Answered all questions prior to end of session.   Eval: Patient presents to physical  therapy with complaints of bilateral lower extremity weakness, pain and balance deficits.  Symptoms started after use of statins and has improved over the last 8 months but is still limiting her function at home and in the community.  Patient presents with weakness, spasms and pain that is greatly limiting current presentation.  Educated patient on gentle stretching and muscle activation exercises which were tolerated moderately well.  Patient would greatly benefit from skilled PT to improve overall functional strength and mobility to maintain independence at home.  OBJECTIVE IMPAIRMENTS: decreased activity tolerance, decreased balance, decreased ROM, decreased strength, improper body mechanics, postural dysfunction, and pain.   ACTIVITY LIMITATIONS: standing, sleeping, stairs, and locomotion level  PARTICIPATION LIMITATIONS: community activity and yard work  PERSONAL FACTORS: Age, Fitness, and Time since onset of injury/illness/exacerbation are also affecting patient's functional outcome.   REHAB POTENTIAL: Good  CLINICAL DECISION MAKING: Stable/uncomplicated  EVALUATION COMPLEXITY: Low   GOALS: Goals reviewed with patient? yes  SHORT TERM GOALS: Target date: 12/24/2023   Patient will be independent in self management strategies to improve quality of life and functional outcomes. Baseline: New Program Goal status: MET  2.  Patient will report at least 50% improvement in overall symptoms and/or function to demonstrate improved functional mobility Baseline: 0% better Goal status: MET   3.  Patient will report no cramps in the morning to improve sleep tolerance Baseline: Currently getting cramps Goal status: PROGRESSING      LONG TERM GOALS: Target date: 02/04/2024   Patient will report at least 75% improvement in overall symptoms and/or function to demonstrate improved functional mobility Baseline: 0% better Goal status: MET  2.  Patient will score at least 2 points higher on  PSFS average to demonstrate change in overall function. Baseline: see above Goal status: MET  3.  Patient will be able to stand on either leg and single-leg stance for at least 30 seconds to demonstrate improved static balance Baseline:  Goal status: PROGRESSING    PLAN:  PT FREQUENCY: 1-2x/week for a total of 16 visits over 12 week certification period   PT DURATION: 12 weeks  PLANNED INTERVENTIONS: 97110-Therapeutic exercises, 97530- Therapeutic activity, [id number]- Neuromuscular re-education, 97535- Self Care, 02859- Manual therapy, [id number]- Gait training, [phone]- Orthotic Fit/training, [phone]- Canalith repositioning, [id number]- Aquatic Therapy, 97014- Electrical stimulation (  unattended), [phone]- Ionotophoresis 4mg /ml Dexamethasone, Patient/Family education, Balance training, Stair training, Taping, Dry Needling, Joint mobilization, Joint manipulation, Spinal manipulation, Spinal mobilization, Cryotherapy, and Moist heat   PLAN FOR NEXT SESSION: DC to HEP   4:47 PM, 12/24/23 Olivia Church, DPT Physical Therapy with [place]

## 2023-12-25 DIAGNOSIS — H43821 Vitreomacular adhesion, right eye: Secondary | ICD-10-CM | POA: Diagnosis not present

## 2023-12-25 DIAGNOSIS — H353134 Nonexudative age-related macular degeneration, bilateral, advanced atrophic with subfoveal involvement: Secondary | ICD-10-CM | POA: Diagnosis not present

## 2023-12-25 DIAGNOSIS — H353133 Nonexudative age-related macular degeneration, bilateral, advanced atrophic without subfoveal involvement: Secondary | ICD-10-CM | POA: Diagnosis not present

## 2023-12-25 LAB — HM DIABETES EYE EXAM

## 2023-12-28 ENCOUNTER — Encounter: Payer: Self-pay | Admitting: Internal Medicine

## 2023-12-29 DIAGNOSIS — H3554 Dystrophies primarily involving the retinal pigment epithelium: Secondary | ICD-10-CM | POA: Diagnosis not present

## 2023-12-30 ENCOUNTER — Encounter: Payer: Self-pay | Admitting: Internal Medicine

## 2023-12-30 DIAGNOSIS — H3530 Unspecified macular degeneration: Secondary | ICD-10-CM

## 2023-12-31 NOTE — Telephone Encounter (Signed)
**Note De-identified  [place] Obfuscation** Please advise 

## 2023-12-31 NOTE — Telephone Encounter (Signed)
 Patient called in to relay info. Please advise.

## 2024-02-04 ENCOUNTER — Other Ambulatory Visit: Payer: Self-pay | Admitting: Internal Medicine

## 2024-02-06 DIAGNOSIS — H3554 Dystrophies primarily involving the retinal pigment epithelium: Secondary | ICD-10-CM | POA: Diagnosis not present

## 2024-02-06 DIAGNOSIS — H02052 Trichiasis without entropian right lower eyelid: Secondary | ICD-10-CM | POA: Diagnosis not present

## 2024-03-02 ENCOUNTER — Encounter: Payer: Self-pay | Admitting: Internal Medicine

## 2024-03-03 MED ORDER — COVID-19 MRNA VACC (MODERNA) 50 MCG/0.5ML IM SUSP: 0.5000 mL | Freq: Once | INTRAMUSCULAR | 0 refills | Status: AC

## 2024-03-16 ENCOUNTER — Emergency Department (HOSPITAL_COMMUNITY)

## 2024-03-16 ENCOUNTER — Other Ambulatory Visit: Payer: Self-pay

## 2024-03-16 ENCOUNTER — Emergency Department (HOSPITAL_COMMUNITY)
Admission: EM | Admit: 2024-03-16 | Discharge: 2024-03-16 | Disposition: A | Discharge status: Home / Self Care | Attending: Emergency Medicine | Admitting: Emergency Medicine

## 2024-03-16 ENCOUNTER — Ambulatory Visit (INDEPENDENT_AMBULATORY_CARE_PROVIDER_SITE_OTHER): Payer: Medicare PPO

## 2024-03-16 ENCOUNTER — Encounter (HOSPITAL_COMMUNITY): Payer: Self-pay | Admitting: Emergency Medicine

## 2024-03-16 VITALS — BP 109/52 | HR 73 | Ht 64.0 in | Wt 145.0 lb

## 2024-03-16 DIAGNOSIS — Z041 Encounter for examination and observation following transport accident: Secondary | ICD-10-CM | POA: Diagnosis not present

## 2024-03-16 DIAGNOSIS — M25552 Pain in left hip: Secondary | ICD-10-CM | POA: Diagnosis not present

## 2024-03-16 DIAGNOSIS — Z0000 Encounter for general adult medical examination without abnormal findings: Secondary | ICD-10-CM | POA: Diagnosis not present

## 2024-03-16 DIAGNOSIS — M5030 Other cervical disc degeneration, unspecified cervical region: Secondary | ICD-10-CM | POA: Diagnosis not present

## 2024-03-16 DIAGNOSIS — Y92410 Unspecified street and highway as the place of occurrence of the external cause: Secondary | ICD-10-CM | POA: Insufficient documentation

## 2024-03-16 DIAGNOSIS — S0990XA Unspecified injury of head, initial encounter: Secondary | ICD-10-CM | POA: Diagnosis not present

## 2024-03-16 DIAGNOSIS — J45909 Unspecified asthma, uncomplicated: Secondary | ICD-10-CM | POA: Insufficient documentation

## 2024-03-16 DIAGNOSIS — M79659 Pain in unspecified thigh: Secondary | ICD-10-CM | POA: Insufficient documentation

## 2024-03-16 DIAGNOSIS — E119 Type 2 diabetes mellitus without complications: Secondary | ICD-10-CM | POA: Insufficient documentation

## 2024-03-16 DIAGNOSIS — M542 Cervicalgia: Secondary | ICD-10-CM | POA: Diagnosis not present

## 2024-03-16 DIAGNOSIS — Z7982 Long term (current) use of aspirin: Secondary | ICD-10-CM | POA: Insufficient documentation

## 2024-03-16 DIAGNOSIS — R519 Headache, unspecified: Secondary | ICD-10-CM | POA: Diagnosis not present

## 2024-03-16 DIAGNOSIS — S199XXA Unspecified injury of neck, initial encounter: Secondary | ICD-10-CM | POA: Diagnosis not present

## 2024-03-16 DIAGNOSIS — M47812 Spondylosis without myelopathy or radiculopathy, cervical region: Secondary | ICD-10-CM | POA: Diagnosis not present

## 2024-03-16 NOTE — Patient Instructions (Addendum)
 Erica [name],  Thank you for taking the time for your Medicare Wellness Visit. I appreciate your continued commitment to your health goals. Please review the care plan we discussed, and feel free to reach out if I can assist you further.  Medicare recommends these wellness visits once per year to help you and your care team stay ahead of potential health issues. These visits are designed to focus on prevention, allowing your provider to concentrate on managing your acute and chronic conditions during your regular appointments.  Please note that Annual Wellness Visits do not include a physical exam. Some assessments may be limited, especially if the visit was conducted virtually. If needed, we may recommend a separate in-person follow-up with your provider.  Ongoing Care Seeing your primary care provider every 3 to 6 months helps us  monitor your health and provide consistent, personalized care.   Referrals If a referral was made during today's visit and you haven't received any updates within two weeks, please contact the referred provider directly to check on the status.  Recommended Screenings:  Health Maintenance  Topic Date Due   Yearly kidney health urinalysis for diabetes  Never done   DTaP/Tdap/Td vaccine (2 - Tdap) 06/25/2019   Flu Shot  01/23/2024   Hemoglobin A1C  03/24/2024   Complete foot exam   08/14/2024   COVID-19 Vaccine (8 - Pfizer risk 2024-25 season) 09/02/2024   Yearly kidney function blood test for diabetes  09/22/2024   Eye exam for diabetics  12/24/2024   Medicare Annual Wellness Visit  03/16/2025   Pneumococcal Vaccine for age over 20  Completed   DEXA scan (bone density measurement)  Completed   HPV Vaccine  Aged Out   Meningitis B Vaccine  Aged Out   Zoster (Shingles) Vaccine  Discontinued       03/16/2024    3:10 PM  Advanced Directives  Does Patient Have a Medical Advance Directive? Yes  Type of Estate agent of [place];Living will   Copy of Healthcare Power of Attorney in Chart? No - copy requested   Advance Care Planning is important because it: Ensures you receive medical care that aligns with your values, goals, and preferences. Provides guidance to your family and loved ones, reducing the emotional burden of decision-making during critical moments.  Vision: Annual vision screenings are recommended for early detection of glaucoma, cataracts, and diabetic retinopathy. These exams can also reveal signs of chronic conditions such as diabetes and high blood pressure.  Dental: Annual dental screenings help detect early signs of oral cancer, gum disease, and other conditions linked to overall health, including heart disease and diabetes.

## 2024-03-16 NOTE — Progress Notes (Signed)
 Subjective:   Erica [name] is a 83 y.o. who presents for a Medicare Wellness preventive visit.  As a reminder, Annual Wellness Visits don't include a physical exam, and some assessments may be limited, especially if this visit is performed virtually. We may recommend an in-person follow-up visit with your provider if needed.  Visit Complete: In person  Persons Participating in Visit: Patient.  AWV Questionnaire: No: Patient Medicare AWV questionnaire was not completed prior to this visit.  Cardiac Risk Factors include: advanced age (>44men, >48 women);diabetes mellitus;dyslipidemia     Objective:    Today's Vitals   03/16/24 1511  BP: (!) 109/52  Pulse: 73  SpO2: 96%  Weight: 145 lb (65.8 kg)  Height: 5' 4 (1.626 m)   Body mass index is 24.89 kg/m.     03/16/2024    3:10 PM 03/13/2023    3:18 PM 01/07/2023    3:08 PM 09/13/2022    3:59 PM 03/29/2022   11:25 AM 08/17/2020    6:23 PM 02/18/2020    1:33 PM  Advanced Directives  Does Patient Have a Medical Advance Directive? Yes Yes Yes  Yes Yes Yes  Type of Estate agent of [place];Living will Healthcare Power of [place];Living will Living will  Healthcare Power of [place];Living will Healthcare Power of [place];Living will Living will;Healthcare Power of Attorney  Copy of Healthcare Power of Attorney in Chart? No - copy requested No - copy requested   No - copy requested  No - copy requested  Would patient like information on creating a medical advance directive?    Yes (ED - Information included in AVS);No - Patient declined       Current Medications (verified) Outpatient Encounter Medications as of 03/16/2024  Medication Sig   Alirocumab  (PRALUENT ) 75 MG/ML SOAJ Inject 1 mL (75 mg total) into the skin every 14 (fourteen) days.   aspirin  EC 81 MG tablet Take 1 tablet (81 mg total) by mouth daily. Swallow whole.   baclofen  (LIORESAL ) 10 MG tablet TAKE 1 TABLET BY MOUTH AT BEDTIME AS NEEDED FOR  MUSCLE SPASMS   cyanocobalamin  (VITAMIN B12) 1000 MCG/ML injection INJECT 1 ML (1,000 MCG TOTAL) INTO THE MUSCLE EVERY 21 ( TWENTY-ONE) DAYS.   gabapentin  (NEURONTIN ) 100 MG capsule Take 1-2 capsules (100-200 mg total) by mouth at bedtime.   ibuprofen  (ADVIL ) 600 MG tablet Take 1 tablet (600 mg total) by mouth every 6 (six) hours as needed. (Patient taking differently: Take 600 mg by mouth as needed.)   Multiple Vitamins-Minerals (PRESERVISION AREDS PO) Take 1 tablet by mouth daily.   rosuvastatin  (CRESTOR ) 5 MG tablet Take 1 tablet (5 mg total) by mouth once a week.   SYRINGE-NEEDLE, DISP, 3 ML (BD SAFETYGLIDE SYRINGE/NEEDLE) 25G X 1 3 ML MISC Used to inject B12   No facility-administered encounter medications on file as of 03/16/2024.    Allergies (verified) Augmentin  [amoxicillin -pot clavulanate], Other, and Scallops [shellfish allergy]   History: Past Medical History:  Diagnosis Date   Allergic rhinitis    Anemia    Arthritis    Asthma    Diet-controlled type 2 diabetes mellitus (HCC) 09/2011 dx   Diverticulosis    Dyslipidemia    Macular degeneration    genetic etiology, not age-related per retinal specailist   Recurrent kidney stones    Past Surgical History:  Procedure Laterality Date   CATARACT EXTRACTION Bilateral 01/2015   L on 8/8, R on 8/22   DILATION AND CURETTAGE OF UTERUS  TONSILLECTOMY AND ADENOIDECTOMY     Family History  Problem Relation Age of Onset   Colon cancer Mother    Arthritis Mother    Cancer Mother        colon cancer   Heart disease Father    Hyperlipidemia Father    Hypertension Father    Diabetes Brother    Prostate cancer Brother    Hyperlipidemia Son    Diabetes Paternal Aunt    Social History   Socioeconomic History   Marital status: Married    Spouse name: Not on file   Number of children: 2   Years of education: Not on file   Highest education level: Doctorate  Occupational History   Occupation: Retired  Tobacco Use    Smoking status: Never   Smokeless tobacco: Never   Tobacco comments:    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[place] but in [place] since 1988  Vaping Use   Vaping status: Never Used  Substance and Sexual Activity   Alcohol use: Yes    Alcohol/week: 3.0 standard drinks of alcohol    Types: 3 Glasses of wine per week    Comment: can't drink red wine anymore; on glass of white wine rarely   Drug use: No   Sexual activity: Not Currently  Other Topics Concern   Not on file  Social History Narrative   Regular exercise-yes   Caffeine Use-yes               Epworth Sleepiness Scale = 5 (as of 07/04/2015)   Social Drivers of Health   Financial Resource Strain: Low Risk  (03/16/2024)   Overall Financial Resource Strain (CARDIA)    Difficulty of Paying Living Expenses: Not hard at all  Food Insecurity: No Food Insecurity (03/16/2024)   Hunger Vital Sign    Worried About Running Out of Food in the Last Year: Never true    Ran Out of Food in the Last Year: Never true  Transportation Needs: No Transportation Needs (03/16/2024)   PRAPARE - Administrator, Civil Service (Medical): No    Lack of Transportation (Non-Medical): No  Physical Activity: Insufficiently Active (03/16/2024)   Exercise Vital Sign    Days of Exercise per Week: 3 days    Minutes of Exercise per Session: 20 min  Stress: No Stress Concern Present (03/16/2024)   [employer] of Occupational Health - Occupational Stress Questionnaire    Feeling of Stress: Not at all  Social Connections: Socially Integrated (03/16/2024)   Social Connection and Isolation Panel    Frequency of Communication with Friends and Family: More than three times a week    Frequency of Social Gatherings with Friends and Family: Three times a week    Attends Religious Services: 1 to 4 times per year    Active Member of Clubs or Organizations: Yes    Attends Banker Meetings: More than 4 times  per year    Marital Status: Married    Tobacco Counseling Counseling given: Not Answered Tobacco comments: 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 bring in CBG monitor from home?: No  Lab Results  Component Value Date  HGBA1C 6.3 09/23/2023   HGBA1C 6.3 12/17/2022   HGBA1C 6.5 04/01/2022     How often do you need to have someone help you when you read instructions, pamphlets, or other written materials from your doctor or pharmacy?: 1 - Never  Interpreter Needed?: No  Information entered by :: Verdie Saba, CMA   Activities of Daily Living     03/16/2024    3:14 PM  In your present state of health, do you have any difficulty performing the following activities:  Hearing? 0  Vision? 0  Difficulty concentrating or making decisions? 0  Walking or climbing stairs? 0  Dressing or bathing? 0  Doing errands, shopping? 0  Preparing Food and eating ? N  Using the Toilet? N  In the past six months, have you accidently leaked urine? Y  Comment wears a depend/pad  Do you have problems with loss of bowel control? N  Managing your Medications? N  Managing your Finances? N  Housekeeping or managing your Housekeeping? N    Patient Care Team: Rollene Almarie LABOR, MD as PCP - General (Internal Medicine) Octavia Bruckner, MD as Consulting Physician (Ophthalmology) [facility] [place]  I have updated your Care Teams any recent Medical Services you may have received from other providers in the past year.     Assessment:   This is a routine wellness examination for Cynde.  Hearing/Vision screen Hearing Screening - Comments:: Wears 1 hearing aid (left) Vision Screening - Comments:: Wears rx  glasses - up to date with routine eye exams with Dr Medford Octavia   Goals Addressed               This Visit's Progress     Patient Stated (pt-stated)        Patient stated she plans to continue manage the neuropathy in her legs/feet       Depression Screen     03/16/2024    3:16 PM 03/13/2023    3:14 PM 02/03/2023    2:47 PM 12/17/2022    1:43 PM 09/16/2022    1:54 PM 03/29/2022   11:24 AM 12/07/2021    2:55 PM  PHQ 2/9 Scores  PHQ - 2 Score 0 0 0 0 0 0 0  PHQ- 9 Score 3 0 0 0 0      Fall Risk     03/16/2024    3:15 PM 10/21/2023    1:18 PM 08/15/2023   11:52 AM 03/13/2023    3:17 PM 02/03/2023    2:47 PM  Fall Risk   Falls in the past year? 0 0 0 0 0  Number falls in past yr: 0 0 0 0 0  Injury with Fall? 0 0 0 0 0  Risk for fall due to : No Fall Risks   No Fall Risks History of fall(s)  Follow up Falls evaluation completed;Falls prevention discussed Falls evaluation completed Falls evaluation completed Falls prevention discussed Falls evaluation completed    MEDICARE RISK AT HOME:  Medicare Risk at Home Any stairs in or around the home?: Yes If so, are there any without handrails?: No Home free of loose throw rugs in walkways, pet beds, electrical cords, etc?: Yes Adequate lighting in your home to reduce risk of falls?: Yes Life alert?: No Use of a cane, walker or w/c?: No Grab bars in the bathroom?: Yes Shower chair or bench in shower?: Yes (shower chair) Elevated toilet seat or a handicapped toilet?: No  TIMED UP AND GO:  Was the test performed?  No  Cognitive Function: 6CIT completed        03/16/2024    3:18 PM 03/13/2023    3:18 PM 03/29/2022   11:26 AM  6CIT Screen  What Year? 0 points 0 points 0 points  What month? 0 points 0 points 0 points  What time? 0 points 0 points 0 points  Count back from 20 0 points 0 points 0 points  Months in reverse 0 points 0 points 0 points  Repeat phrase 0 points 0 points 0 points  Total Score 0 points 0 points 0  points    Immunizations Immunization History  Administered Date(s) Administered    sv, Bivalent, Protein Subunit Rsvpref,pf (Abrysvo) 02/07/2022   Fluad Quad(high Dose 65+) 04/08/2019, 04/20/2020, 04/12/2022   Hepatitis A, Adult 02/24/2013   INFLUENZA, HIGH DOSE SEASONAL PF 04/18/2015, 03/19/2017, 06/09/2023   Influenza Split 03/08/2014   Influenza,inj,Quad PF,6+ Mos 02/24/2013   Influenza-Unspecified 04/03/2016, 04/02/2018   PFIZER Comirnaty(Gray Top)Covid-19 Tri-Sucrose Vaccine 08/15/2020, 01/11/2021, 03/15/2022   PFIZER(Purple Top)SARS-COV-2 Vaccination 07/29/2019, 08/24/2019   PNEUMOCOCCAL CONJUGATE-20 05/01/2023   Pfizer Covid-19 Vaccine Bivalent Booster 76yrs & up 05/22/2021   Pfizer(Comirnaty)Fall Seasonal Vaccine 12 years and older 03/05/2024   Pneumococcal Conjugate-13 05/31/2013   Pneumococcal-Unspecified 09/22/2008   Td 06/24/2009   Zoster Recombinant(Shingrix) 12/04/2016   Zoster, Live 09/09/2007    Screening Tests Health Maintenance  Topic Date Due   Diabetic kidney evaluation - Urine ACR  Never done   DTaP/Tdap/Td (2 - Tdap) 06/25/2019   Influenza Vaccine  01/23/2024   HEMOGLOBIN A1C  03/24/2024   FOOT EXAM  08/14/2024   COVID-19 Vaccine (8 - Pfizer risk 2024-25 season) 09/02/2024   Diabetic kidney evaluation - eGFR measurement  09/22/2024   OPHTHALMOLOGY EXAM  12/24/2024   Medicare Annual Wellness (AWV)  03/16/2025   Pneumococcal Vaccine: 50+ Years  Completed   DEXA SCAN  Completed   HPV VACCINES  Aged Out   Meningococcal B Vaccine  Aged Out   Zoster Vaccines- Shingrix  Discontinued    Health Maintenance Items Addressed:03/16/2024   Additional Screening:  Vision Screening: Recommended annual ophthalmology exams for early detection of glaucoma and other disorders of the eye. Is the patient up to date with their annual eye exam?  Yes  Who is the provider or what is the name of the office in which the patient attends annual eye exams? Dr Medford Hamilton  Dental Screening: Recommended annual dental exams for proper oral hygiene  Community Resource Referral / Chronic Care Management: CRR required this visit?  No   CCM required this visit?  No   Plan:    I have personally reviewed and noted the following in the patient's chart:   Medical and social history Use of alcohol, tobacco or illicit drugs  Current medications and supplements including opioid prescriptions. Patient is not currently taking opioid prescriptions. Functional ability and status Nutritional status Physical activity Advanced directives List of other physicians Hospitalizations, surgeries, and ER visits in previous 12 months Vitals Screenings to include cognitive, depression, and falls Referrals and appointments  In addition, I have reviewed and discussed with patient certain preventive protocols, quality metrics, and best practice recommendations. A written personalized care plan for preventive services as well as general preventive health recommendations were provided to patient.   Verdie CHRISTELLA Saba, CMA   03/16/2024   After Visit Summary: (MyChart) Due to this being a telephonic visit, the after visit summary with patients personalized plan was offered  to patient via MyChart   Notes: Nothing significant to report at this time.
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
## 2024-03-16 NOTE — Discharge Instructions (Signed)
 Take over-the-counter medication such as Tylenol  help with aches and pains.  You may feel stiff and sore for the next week or so.  You can apply ice intermittently to help with swelling and discomfort for the next 48 hours.  Return to the ED if you start having abdominal pain vomiting shortness of breath chest pain or other concerning symptoms

## 2024-03-16 NOTE — ED Notes (Signed)
 Pt ambulatory to waiting room. Pt verbalized understanding of discharge instructions.

## 2024-03-23 ENCOUNTER — Encounter: Payer: Self-pay | Admitting: Internal Medicine

## 2024-03-23 ENCOUNTER — Ambulatory Visit: Admitting: Internal Medicine

## 2024-03-23 VITALS — BP 116/80 | HR 71 | Temp 98.9°F | Ht 64.0 in | Wt 144.0 lb

## 2024-03-23 DIAGNOSIS — E1169 Type 2 diabetes mellitus with other specified complication: Secondary | ICD-10-CM

## 2024-03-23 DIAGNOSIS — J189 Pneumonia, unspecified organism: Secondary | ICD-10-CM

## 2024-03-23 DIAGNOSIS — E785 Hyperlipidemia, unspecified: Secondary | ICD-10-CM | POA: Diagnosis not present

## 2024-03-23 DIAGNOSIS — E119 Type 2 diabetes mellitus without complications: Secondary | ICD-10-CM

## 2024-03-23 LAB — COMPREHENSIVE METABOLIC PANEL WITH GFR
ALT: 13 U/L (ref 0–35)
AST: 17 U/L (ref 0–37)
Albumin: 4 g/dL (ref 3.5–5.2)
Alkaline Phosphatase: 53 U/L (ref 39–117)
BUN: 16 mg/dL (ref 6–23)
CO2: 28 meq/L (ref 19–32)
Calcium: 9 mg/dL (ref 8.4–10.5)
Chloride: 103 meq/L (ref 96–112)
Creatinine, Ser: 0.92 mg/dL (ref 0.40–1.20)
GFR: 56.73 mL/min — ABNORMAL LOW (ref >60.00)
Glucose, Bld: 95 mg/dL (ref 70–99)
Potassium: 4.3 meq/L (ref 3.5–5.1)
Sodium: 137 meq/L (ref 135–145)
Total Bilirubin: 0.4 mg/dL (ref 0.2–1.2)
Total Protein: 6.8 g/dL (ref 6.0–8.3)

## 2024-03-23 LAB — LIPID PANEL
Cholesterol: 142 mg/dL (ref 0–200)
HDL: 32.3 mg/dL — ABNORMAL LOW (ref >39.00)
LDL Cholesterol: 85 mg/dL (ref 0–99)
NonHDL: 109.76
Total CHOL/HDL Ratio: 4
Triglycerides: 125 mg/dL (ref 0.0–149.0)
VLDL: 25 mg/dL (ref 0.0–40.0)

## 2024-03-23 LAB — HEMOGLOBIN A1C: Hgb A1c MFr Bld: 6.3 % (ref 4.6–6.5)

## 2024-03-23 MED ORDER — AZITHROMYCIN 250 MG PO TABS
ORAL_TABLET | ORAL | 0 refills | Status: AC
Start: 2024-03-23 — End: 2024-03-28

## 2024-03-23 NOTE — Patient Instructions (Signed)
 We will get you in with the endocrinologist.   We have sent in azithromycin  to take 2 pills today. Then 1 pill daily starting day 2 until gone.
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
## 2024-03-25 ENCOUNTER — Ambulatory Visit: Payer: Self-pay | Admitting: Internal Medicine

## 2024-03-25 DIAGNOSIS — R052 Subacute cough: Secondary | ICD-10-CM

## 2024-03-26 DIAGNOSIS — J189 Pneumonia, unspecified organism: Secondary | ICD-10-CM | POA: Insufficient documentation

## 2024-03-26 NOTE — Assessment & Plan Note (Signed)
 Interested in endocrinology consultation for diabetic symptoms and peripheral neuropathy management. Refer to [facility] endocrinology group for evaluation and management. Order blood work to assess blood sugar levels.

## 2024-03-26 NOTE — Assessment & Plan Note (Signed)
 Symptoms and auscultation suggest possible community-acquired pneumonia. COVID-19 tests are negative. Prescribe azithromycin  (Z-Pak): two pills on day one, then one pill daily for five days.

## 2024-03-26 NOTE — Assessment & Plan Note (Signed)
 On weekly statin regimen, interested in cholesterol monitoring and dietary management. Order blood work to check cholesterol levels.

## 2024-04-07 ENCOUNTER — Ambulatory Visit

## 2024-04-07 DIAGNOSIS — R052 Subacute cough: Secondary | ICD-10-CM

## 2024-04-07 DIAGNOSIS — R059 Cough, unspecified: Secondary | ICD-10-CM | POA: Diagnosis not present

## 2024-04-12 ENCOUNTER — Ambulatory Visit: Payer: Self-pay | Admitting: Internal Medicine

## 2024-04-21 DIAGNOSIS — R252 Cramp and spasm: Secondary | ICD-10-CM | POA: Diagnosis not present

## 2024-04-21 DIAGNOSIS — G629 Polyneuropathy, unspecified: Secondary | ICD-10-CM | POA: Diagnosis not present

## 2024-04-21 DIAGNOSIS — N182 Chronic kidney disease, stage 2 (mild): Secondary | ICD-10-CM | POA: Diagnosis not present

## 2024-04-21 DIAGNOSIS — Z833 Family history of diabetes mellitus: Secondary | ICD-10-CM | POA: Diagnosis not present

## 2024-04-21 DIAGNOSIS — Z8249 Family history of ischemic heart disease and other diseases of the circulatory system: Secondary | ICD-10-CM | POA: Diagnosis not present

## 2024-04-21 DIAGNOSIS — E785 Hyperlipidemia, unspecified: Secondary | ICD-10-CM | POA: Diagnosis not present

## 2024-05-02 ENCOUNTER — Other Ambulatory Visit: Payer: Self-pay | Admitting: Internal Medicine

## 2024-06-07 ENCOUNTER — Telehealth: Payer: Self-pay

## 2024-06-07 NOTE — Telephone Encounter (Signed)
 Copied from CRM #[id number]. Topic: Referral - Request for Referral >> Jun 07, 2024  3:42 PM Burnard DEL wrote: Did the patient discuss referral with their provider in the last year? No (If No - schedule appointment) (If Yes - send message)  Appointment offered? Yes  Type of order/referral and detailed reason for visit:Nephrologist- patient stated that she spoke with provider in the past about her kidney lab results,that were not good  Preference of office, provider, location: [facility]  If referral order, have you been seen by this specialty before? No (If Yes, this issue or another issue? When? Where?  Can we respond through MyChart? Yes

## 2024-06-09 ENCOUNTER — Encounter: Payer: Self-pay | Admitting: Internal Medicine

## 2024-06-09 NOTE — Telephone Encounter (Signed)
 Her kidney function is essentially normal for age. Last GFR 54 (>60 is considered normal). This has not significantly changed recently so I do not think she needs to see a kidney specialist. Does she have a specific concern I can help address with her?

## 2024-06-11 NOTE — Telephone Encounter (Signed)
 My advice is the same but she is free to make a visit if she wants a more in depth conversation about same or if specific question I can answer.

## 2024-06-14 NOTE — Telephone Encounter (Signed)
 Pt was scheduled for an appt to discuss labs and concerns with kidneys, 06/29/24@220pm .

## 2024-06-22 ENCOUNTER — Ambulatory Visit: Payer: Self-pay

## 2024-06-22 NOTE — Telephone Encounter (Signed)
 FYI Only or Action Required?: Action required by provider: medication refill request and clinical question for provider.  Patient was last seen in primary care on 03/23/2024 by Rollene Almarie LABOR, MD.  Called Nurse Triage reporting Covid Positive.  Symptoms began several days ago.  Interventions attempted: Nothing.  Symptoms are: unchanged.  Triage Disposition: Call PCP Within 24 Hours  Patient/caregiver understands and will follow disposition?:    Copied from CRM #[id number]. Topic: Clinical - Red Word Triage >> Jun 22, 2024  4:22 PM Amy B wrote: Red Word that prompted transfer to Nurse Triage: Headache, sore throat, ear aches, low-grade fever.  Positive for COVID Reason for Disposition  [1] Fever > 100 F (37.8 C) AND [2] bedridden (e.g., CVA, chronic illness, recovering from surgery)  Answer Assessment - Initial Assessment Questions Patient declines appt and request Paxlovid .  Advised call back/UC or ED/911 if symptoms worsen. Patient verbalized understanding.    1. SYMPTOMS: What is your main symptom or concern? (e.g., cough, fever, shortness of breath, muscle aches)     HA, scratchy throat, ear aches, runny nose, low fever, neck lymph nodes; size of grape 2. ONSET: When did the symptoms start?      2 days ago 3. COUGH: Do you have a cough? If Yes, ask: How bad is the cough?       moderate 4. FEVER: Do you have a fever? If Yes, ask: What is your temperature, how was it measured, and when did it start?     Have not check, chills,denies n/v/d 5. BREATHING DIFFICULTY: Are you having any difficulty breathing? (e.g., normal; shortness of breath, wheezing, unable to speak)      no  7. OTHER SYMPTOMS: Do you have any other symptoms?  (e.g., chills, fatigue, headache, loss of smell or taste, muscle pain, sore throat)     Body aches, weakness 8. COVID-19 DIAGNOSIS: How do you know that you have COVID? (e.g., positive lab test or self-test, diagnosed by doctor or  NP/PA, symptoms after exposure).     Home test positive 9. COVID-19 EXPOSURE: Was there any known exposure to COVID before the symptoms began?      Family positive 10. COVID-19 VACCINE: Have you had the COVID-19 vaccine? If Yes, ask: When did you last get it?       yes 11. HIGH RISK DISEASE: Do you have any chronic medical problems? (e.g., asthma, heart or lung disease, weak immune system, obesity, etc.)     no  Protocols used: COVID-19 - Diagnosed or Suspected-A-AH

## 2024-06-23 ENCOUNTER — Telehealth: Payer: Self-pay

## 2024-06-23 NOTE — Telephone Encounter (Signed)
 Copied from CRM [id number]. Topic: Clinical - Medication Question >> Jun 22, 2024  5:11 PM Shereese L wrote: Reason for CRM: Patient called in and stated that she read the covid test wrong and stated that she doesn't have covid and medication is not needed

## 2024-06-28 NOTE — Telephone Encounter (Signed)
 Noted

## 2024-06-29 ENCOUNTER — Ambulatory Visit: Admitting: Internal Medicine

## 2024-06-29 VITALS — BP 116/60 | HR 71 | Temp 98.6°F | Ht 64.0 in | Wt 145.2 lb

## 2024-06-29 DIAGNOSIS — N1831 Chronic kidney disease, stage 3a: Secondary | ICD-10-CM

## 2024-06-29 DIAGNOSIS — E119 Type 2 diabetes mellitus without complications: Secondary | ICD-10-CM

## 2024-06-29 DIAGNOSIS — R252 Cramp and spasm: Secondary | ICD-10-CM

## 2024-06-29 DIAGNOSIS — G6289 Other specified polyneuropathies: Secondary | ICD-10-CM | POA: Diagnosis not present

## 2024-06-29 LAB — MAGNESIUM: Magnesium: 1.9 mg/dL (ref 1.5–2.5)

## 2024-06-29 LAB — COMPREHENSIVE METABOLIC PANEL WITH GFR
ALT: 16 U/L (ref 3–35)
AST: 16 U/L (ref 5–37)
Albumin: 4.2 g/dL (ref 3.5–5.2)
Alkaline Phosphatase: 62 U/L (ref 39–117)
BUN: 21 mg/dL (ref 6–23)
CO2: 28 meq/L (ref 19–32)
Calcium: 9.2 mg/dL (ref 8.4–10.5)
Chloride: 103 meq/L (ref 96–112)
Creatinine, Ser: 0.91 mg/dL (ref 0.40–1.20)
GFR: 57.37 mL/min — ABNORMAL LOW
Glucose, Bld: 94 mg/dL (ref 70–99)
Potassium: 4.5 meq/L (ref 3.5–5.1)
Sodium: 137 meq/L (ref 135–145)
Total Bilirubin: 0.2 mg/dL (ref 0.2–1.2)
Total Protein: 6.9 g/dL (ref 6.0–8.3)

## 2024-06-29 LAB — MICROALBUMIN / CREATININE URINE RATIO
Creatinine,U: 108.9 mg/dL
Microalb Creat Ratio: UNDETERMINED mg/g (ref 0.0–30.0)
Microalb, Ur: <0.7 mg/dL

## 2024-06-29 LAB — URIC ACID: Uric Acid, Serum: 5.7 mg/dL (ref 2.4–7.0)

## 2024-06-29 MED ORDER — GABAPENTIN 100 MG PO CAPS: 100.0000 mg | ORAL_CAPSULE | Freq: Every day | ORAL | 3 refills | Status: AC

## 2024-06-29 NOTE — Progress Notes (Signed)
 "  Subjective:   Patient ID: Erica [name], female    DOB: 1941-05-24, 84 y.o.   MRN: [id number]  Discussed the use of AI scribe software for clinical note transcription with the patient, who gave verbal consent to proceed.  History of Present Illness Erica [name] is an 84 year old female who presents with concerns about kidney function and muscle cramps.  She experiences widespread muscle cramps, particularly in her legs, hands, and feet. These cramps are severe and occasionally require the use of baclofen , though she is cautious with its use due to concerns about potential side effects, including increased risk of dementia and falls. No dehydration is reported.  She is concerned about her kidney function, noting that her GFR has been stable over the past 12 years, ranging from 56 to 65, and her creatinine levels have remained around 1. She attributes this to her strong muscle mass, as indicated by a genetic test suggesting she has the musculature of an 'elite athlete'. She drinks plenty of water and maintains a healthy diet, including fruits like bananas, to manage her symptoms.  She experiences neuropathy, which started in her toes and has progressed to her ankles and legs. She wonders if this might be contributing to her muscle cramps. Despite her efforts to stay hydrated and maintain a healthy lifestyle, she continues to experience these symptoms and is seeking further evaluation to understand the cause.  She has a history of joint pain and describes experiencing intense pain in her big toe at night, which she suspects might be related to gout.  Review of Systems  Constitutional: Negative.   HENT: Negative.    Eyes: Negative.   Respiratory:  Negative for cough, chest tightness and shortness of breath.   Cardiovascular:  Negative for chest pain, palpitations and leg swelling.  Gastrointestinal:  Negative for abdominal distention, abdominal pain, constipation, diarrhea, nausea and  vomiting.  Musculoskeletal:  Positive for myalgias.  Skin: Negative.   Neurological:  Positive for numbness.  Psychiatric/Behavioral: Negative.      Objective:  Physical Exam Constitutional:      Appearance: She is well-developed.  HENT:     Head: Normocephalic and atraumatic.  Cardiovascular:     Rate and Rhythm: Normal rate and regular rhythm.  Pulmonary:     Effort: Pulmonary effort is normal. No respiratory distress.     Breath sounds: Normal breath sounds. No wheezing or rales.  Abdominal:     General: Bowel sounds are normal. There is no distension.     Palpations: Abdomen is soft.     Tenderness: There is no abdominal tenderness.  Musculoskeletal:     Cervical back: Normal range of motion.  Skin:    General: Skin is warm and dry.  Neurological:     Mental Status: She is alert and oriented to person, place, and time.     Sensory: Sensory deficit present.     Coordination: Coordination normal.     There were no vitals filed for this visit.  Assessment and Plan Assessment & Plan Chronic kidney disease, stage 3a   Her GFR remains stable at 56.7, consistent over the past decade with no significant progression. BUN levels are normal. No nephrology referral is needed unless changes occur. A urine test for proteinuria and kidney function tests have been rechecked. A uric acid level is ordered for gout risk assessment.  Peripheral neuropathy   The neuropathy has progressed from her toes to her ankles and legs, possibly contributing to  muscle cramps due to altered sensory input and microtrauma.  Muscle cramps   She experiences chronic cramps in her legs, hands, and feet. Baclofen  is used sparingly due to dementia and fall risk. The cramps are not linked to kidney function, but neuropathy may contribute. Baclofen  will continue as needed, and she is encouraged to maintain hydration and follow dietary measures.  Type 2 diabetes mellitus   Peripheral neuropathy is noted, but  no specific diabetes management is discussed. We are checking UACR today and HgA1c up to date and controlled.    "

## 2024-06-30 ENCOUNTER — Ambulatory Visit: Payer: Self-pay | Admitting: Internal Medicine

## 2024-06-30 NOTE — Telephone Encounter (Signed)
Addressed.  See previous note.

## 2024-07-02 ENCOUNTER — Encounter: Payer: Self-pay | Admitting: Internal Medicine

## 2024-07-02 DIAGNOSIS — N1831 Chronic kidney disease, stage 3a: Secondary | ICD-10-CM | POA: Insufficient documentation

## 2024-07-02 DIAGNOSIS — R252 Cramp and spasm: Secondary | ICD-10-CM | POA: Insufficient documentation

## 2024-07-06 ENCOUNTER — Emergency Department (HOSPITAL_BASED_OUTPATIENT_CLINIC_OR_DEPARTMENT_OTHER)
Admission: EM | Admit: 2024-07-06 | Discharge: 2024-07-06 | Disposition: A | Discharge status: Home / Self Care | Attending: Emergency Medicine | Admitting: Emergency Medicine

## 2024-07-06 ENCOUNTER — Emergency Department (HOSPITAL_BASED_OUTPATIENT_CLINIC_OR_DEPARTMENT_OTHER): Admitting: Radiology

## 2024-07-06 ENCOUNTER — Ambulatory Visit: Payer: Self-pay

## 2024-07-06 DIAGNOSIS — R531 Weakness: Secondary | ICD-10-CM | POA: Diagnosis not present

## 2024-07-06 DIAGNOSIS — R079 Chest pain, unspecified: Secondary | ICD-10-CM | POA: Diagnosis present

## 2024-07-06 DIAGNOSIS — J45909 Unspecified asthma, uncomplicated: Secondary | ICD-10-CM | POA: Diagnosis not present

## 2024-07-06 DIAGNOSIS — E119 Type 2 diabetes mellitus without complications: Secondary | ICD-10-CM | POA: Diagnosis not present

## 2024-07-06 DIAGNOSIS — Z7982 Long term (current) use of aspirin: Secondary | ICD-10-CM | POA: Insufficient documentation

## 2024-07-06 LAB — BASIC METABOLIC PANEL WITH GFR
Anion gap: 10 (ref 5–15)
BUN: 15 mg/dL (ref 8–23)
CO2: 25 mmol/L (ref 22–32)
Calcium: 9.3 mg/dL (ref 8.9–10.3)
Chloride: 106 mmol/L (ref 98–111)
Creatinine, Ser: 0.76 mg/dL (ref 0.44–1.00)
GFR, Estimated: >60 mL/min
Glucose, Bld: 118 mg/dL — ABNORMAL HIGH (ref 70–99)
Potassium: 4.5 mmol/L (ref 3.5–5.1)
Sodium: 141 mmol/L (ref 135–145)

## 2024-07-06 LAB — CBC
HCT: 42.1 % (ref 36.0–46.0)
Hemoglobin: 14.2 g/dL (ref 12.0–15.0)
MCH: 30.5 pg (ref 26.0–34.0)
MCHC: 33.7 g/dL (ref 30.0–36.0)
MCV: 90.5 fL (ref 80.0–100.0)
Platelets: 257 K/uL (ref 150–400)
RBC: 4.65 MIL/uL (ref 3.87–5.11)
RDW: 13.1 % (ref 11.5–15.5)
WBC: 6.3 K/uL (ref 4.0–10.5)
nRBC: 0 % (ref 0.0–0.2)

## 2024-07-06 LAB — TROPONIN T, HIGH SENSITIVITY: Troponin T High Sensitivity: <15 ng/L (ref 0–19)

## 2024-07-06 NOTE — ED Provider Notes (Signed)
 " [place] EMERGENCY DEPARTMENT AT [facility] Provider Note   CSN: [id number] Arrival date & time: 07/06/24  0909     Patient presents with: Chest Pain   Erica [name] is a 84 y.o. female.   HPI     84 year old female with a history of type 2 diabetes, dyslipidemia, asthma  Episode last night when feeling stressed out Chest hurting, not a sharp pain but was intense all over chest and back, like back and chest pressing against eachother then went into neck and into jawline. Tried to relax and be calm, as she did that pain subsided and within 10 minutes was ok. 3-4 minutes intense Dyspnea in the beginning then improved No nausea or vomiting Generalized weakness No leg pain or swelling No fever, no cough Pain was there when sitting, not sure if worse with position, deep breaths or exertion.  No recent travel, surgeries, history of DVT or PE Prior to Admission medications  Medication Sig Start Date End Date Taking? Authorizing Provider  Alirocumab  (PRALUENT ) 75 MG/ML SOAJ Inject 1 mL (75 mg total) into the skin every 14 (fourteen) days. Patient not taking: Reported on 06/29/2024 10/21/23   Rollene Almarie LABOR, MD  aspirin  EC 81 MG tablet Take 1 tablet (81 mg total) by mouth daily. Swallow whole. 05/30/21   Okey Vina GAILS, MD  baclofen  (LIORESAL ) 10 MG tablet TAKE 1 TABLET BY MOUTH AT BEDTIME AS NEEDED FOR MUSCLE SPASMS 02/04/24   Rollene Almarie LABOR, MD  cyanocobalamin  (VITAMIN B12) 1000 MCG/ML injection INJECT 1 ML (1,000 MCG TOTAL) INTO THE MUSCLE EVERY 21 ( TWENTY-ONE) DAYS. 05/03/24   Rollene Almarie LABOR, MD  gabapentin  (NEURONTIN ) 100 MG capsule Take 1-2 capsules (100-200 mg total) by mouth at bedtime. 06/29/24   Rollene Almarie LABOR, MD  ibuprofen  (ADVIL ) 600 MG tablet Take 1 tablet (600 mg total) by mouth every 6 (six) hours as needed. 09/13/22   Espinoza, Alejandra, DO  Multiple Vitamins-Minerals (PRESERVISION AREDS PO) Take 1 tablet by mouth daily.    [provider]  rosuvastatin  (CRESTOR ) 5 MG tablet Take 1 tablet (5 mg total) by mouth once a week. 11/24/23   Rollene Almarie LABOR, MD  SYRINGE-NEEDLE, DISP, 3 ML (BD SAFETYGLIDE SYRINGE/NEEDLE) 25G X 1 3 ML MISC Used to inject B12 05/25/18   Rollene Almarie LABOR, MD    Allergies: Augmentin  [amoxicillin -pot clavulanate], Other, and Scallops [shellfish allergy]    Review of Systems  Updated Vital Signs BP 129/73   Pulse 64   Temp 98 F (36.7 C) (Oral)   Resp 15   SpO2 94%   Physical Exam Vitals and nursing note reviewed.  Constitutional:      General: She is not in acute distress.    Appearance: She is well-developed. She is not diaphoretic.  HENT:     Head: Normocephalic and atraumatic.  Eyes:     Conjunctiva/sclera: Conjunctivae normal.  Cardiovascular:     Rate and Rhythm: Normal rate and regular rhythm.     Heart sounds: Normal heart sounds. No murmur heard.    No friction rub. No gallop.  Pulmonary:     Effort: Pulmonary effort is normal. No respiratory distress.     Breath sounds: Normal breath sounds. No wheezing or rales.  Abdominal:     General: There is no distension.     Palpations: Abdomen is soft.     Tenderness: There is no abdominal tenderness. There is no guarding.  Musculoskeletal:  General: No tenderness.     Cervical back: Normal range of motion.  Skin:    General: Skin is warm and dry.     Findings: No erythema or rash.  Neurological:     Mental Status: She is alert and oriented to person, place, and time.     (all labs ordered are listed, but only abnormal results are displayed) Labs Reviewed  BASIC METABOLIC PANEL WITH GFR - Abnormal; Notable for the following components:      Result Value   Glucose, Bld 118 (*)    All other components within normal limits  CBC  TROPONIN T, HIGH SENSITIVITY  TROPONIN T, HIGH SENSITIVITY    EKG: EKG Interpretation Date/Time:  Tuesday July 06 2024 09:18:41 EST Ventricular Rate:  68 PR  Interval:  126 QRS Duration:  86 QT Interval:  402 QTC Calculation: 427 R Axis:   61  Text Interpretation: Normal sinus rhythm Nonspecific ST abnormality Abnormal ECG When compared with ECG of 07-Jan-2023 15:32, PREVIOUS ECG IS PRESENT No significant change since last tracing Confirmed by Dreama Longs ([id number]) on 07/06/2024 10:25:53 AM  Radiology: [facility] Chest 2 View Result Date: 07/06/2024 CLINICAL DATA:  Chest pain. EXAM: CHEST - 2 VIEW COMPARISON:  04/07/2024 FINDINGS: The heart size and mediastinal contours are within normal limits. There is no evidence of pulmonary edema, consolidation, pneumothorax, nodule or pleural fluid. The visualized skeletal structures are unremarkable. IMPRESSION: No active cardiopulmonary disease. Electronically Signed   By: Marcey Moan M.D.   On: 07/06/2024 09:43     Procedures   Medications Ordered in the ED - No data to display                                  Medical Decision Making Amount and/or Complexity of Data Reviewed Labs: ordered. Radiology: ordered.   84 year old female with a history of type 2 diabetes, dyslipidemia, asthma who presents following an episode of chest pain that occurred last night.  Differential diagnosis for chest pain includes pulmonary embolus, dissection, pneumothorax, pneumonia, ACS, myocarditis, pericarditis.    EKG was done and evaluate by me and showed no acute ST changes and no signs of pericarditis.   Chest x-ray was done and evaluated by me and radiology and showed no sign of pneumonia or pneumothorax.   Labs completed and evaluated by me sho no clinically significant anemia, electrolyte abnormalities, negative troponin.   Given episode occurred last night, no continued chest pain , doubt ACS in setting of negative troponin.  Low suspicion for PE, aortic dissection by history and exam.  Will refer to Cardiology for continued evaluation. Discussed reasons to return. Patient discharged in stable condition with  understanding of reasons to return.      Final diagnoses:  Chest pain, unspecified type    ED Discharge Orders          Ordered    Ambulatory referral to Cardiology        07/06/24 1117               Dreama Longs, MD 07/06/24 2139  "

## 2024-07-06 NOTE — ED Triage Notes (Signed)
 Reports central CP that rads into upper back and neck. States started when she was stressed. Squeezing. Symptoms lasted approx 30 mins and then resolved.

## 2024-07-06 NOTE — Telephone Encounter (Signed)
 FYI Only or Action Required?: Action required by provider: request for appointment and ER Refusal.  Patient was last seen in primary care on 06/29/2024 by Rollene Almarie LABOR, MD.  Called Nurse Triage reporting Chest Pain.  Symptoms began yesterday.  Interventions attempted: Rest, hydration, or home remedies.  Symptoms are: resolved after ten minutes per patient.  Triage Disposition: Go to ED Now (Notify PCP)  Patient/caregiver understands and will follow disposition?: No, wishes to speak with PCP               Copied from CRM #[id number]. Topic: Clinical - Red Word Triage >> Jul 06, 2024  8:04 AM Darshell M wrote: Red Word that prompted transfer to Nurse Triage: Patient had what she thought was an angina attack. Patient under stress, squeezing chest pain radiating into back and into jaw line. Lasted approximately 10 minutes Reason for Disposition  [1] Chest pain (or angina) comes and goes AND [2] is happening more often (increasing in frequency) or getting worse (increasing in severity)  (Exception: Chest pains that last only a few seconds.)  Answer Assessment - Initial Assessment Questions Patient advised that last night she was under some stress & started having what she thinks was an angina attack She reports squeezing in chest and into her back This radiated into her neck and jawline Patient states she slowed her breathing and after ten minutes the chest discomfort/squeezing sensation subsided Patient states that she was concerned for possible angina and wants to come in and see her PCP Dr Almarie Rollene. Patient wanted to come in and get an EKG but she was advised that with her symptoms the Emergency Room is recommended at this time. Patient did not want to go to the Emergency Room at this time. She denies any pain, difficulty breathing, or any other noted symptoms at this time. She is advised that would be the recommendation at this time but she wanted  her PCP to get this message and still did not want to go to the Emergency Room at this time. She is advised to all 911 if symptoms get worse and call us  back with any further questions/concerns/changes.  Protocols used: Chest Pain-A-AH

## 2024-07-06 NOTE — Telephone Encounter (Signed)
 Called [facility] and advised Erin of patient's symptoms and ER Refusal at this time.

## 2024-07-06 NOTE — Telephone Encounter (Signed)
 Noted

## 2024-07-08 ENCOUNTER — Telehealth: Payer: Self-pay | Admitting: Internal Medicine

## 2024-07-08 NOTE — Telephone Encounter (Signed)
 Pt hospitalized on 1/13 for chest pain. Scheduled for 2/13 f/u. Please follow up if she can be seen sooner.

## 2024-07-09 MED ORDER — NITROGLYCERIN 0.4 MG SL SUBL: 0.4000 mg | SUBLINGUAL_TABLET | SUBLINGUAL | 0 refills | Status: AC | PRN

## 2024-07-15 ENCOUNTER — Encounter: Payer: Self-pay | Admitting: Internal Medicine

## 2024-07-16 MED ORDER — ROSUVASTATIN CALCIUM 5 MG PO TABS: 5.0000 mg | ORAL_TABLET | ORAL | 3 refills | Status: AC

## 2024-07-30 ENCOUNTER — Other Ambulatory Visit: Payer: Self-pay | Admitting: Internal Medicine

## 2024-08-06 ENCOUNTER — Ambulatory Visit: Admitting: Physician Assistant

## 2024-09-14 ENCOUNTER — Ambulatory Visit: Admitting: Endocrinology

## 2025-03-18 ENCOUNTER — Ambulatory Visit
